# Patient Record
Sex: Male | Born: 1950
Health system: Southern US, Community
[De-identification: ages and names within clinical notes are randomized; demographics above are authoritative.]

## PROBLEM LIST (undated history)

## (undated) DIAGNOSIS — I10 Essential (primary) hypertension: Secondary | ICD-10-CM

## (undated) DIAGNOSIS — C801 Malignant (primary) neoplasm, unspecified: Secondary | ICD-10-CM

## (undated) DIAGNOSIS — M1990 Unspecified osteoarthritis, unspecified site: Secondary | ICD-10-CM

## (undated) DIAGNOSIS — E785 Hyperlipidemia, unspecified: Secondary | ICD-10-CM

## (undated) HISTORY — DX: Hyperlipidemia, unspecified: E78.5

---

## 1998-03-19 ENCOUNTER — Inpatient Hospital Stay: Admission: RE | Admit: 1998-03-19 | Discharge: 1998-03-23 | Payer: Self-pay | Admitting: Thoracic Surgery

## 2000-08-24 HISTORY — PX: PROSTATE SURGERY: SHX751

## 2000-08-24 HISTORY — PX: OTHER SURGICAL HISTORY: SHX169

## 2001-07-08 ENCOUNTER — Other Ambulatory Visit: Admission: RE | Admit: 2001-07-08 | Discharge: 2001-07-08 | Payer: Self-pay | Admitting: Urology

## 2006-03-15 ENCOUNTER — Ambulatory Visit: Payer: Self-pay | Admitting: Gastroenterology

## 2006-10-12 ENCOUNTER — Inpatient Hospital Stay (HOSPITAL_COMMUNITY): Admission: EM | Admit: 2006-10-12 | Discharge: 2006-10-13 | Payer: Self-pay | Admitting: Emergency Medicine

## 2008-12-20 ENCOUNTER — Emergency Department: Payer: Self-pay | Admitting: Emergency Medicine

## 2010-03-31 ENCOUNTER — Emergency Department (HOSPITAL_COMMUNITY): Admission: EM | Admit: 2010-03-31 | Discharge: 2010-03-31 | Payer: Self-pay | Admitting: Emergency Medicine

## 2010-11-07 LAB — COMPREHENSIVE METABOLIC PANEL
ALT: 18 U/L (ref 0–53)
AST: 33 U/L (ref 0–37)
Albumin: 3.7 g/dL (ref 3.5–5.2)
BUN: 7 mg/dL (ref 6–23)
Chloride: 103 mEq/L (ref 96–112)
GFR calc Af Amer: >60 mL/min (ref >60)
Potassium: 5 mEq/L (ref 3.5–5.1)
Total Protein: 7.4 g/dL (ref 6.0–8.3)

## 2010-11-07 LAB — URINALYSIS, ROUTINE W REFLEX MICROSCOPIC
Glucose, UA: NEGATIVE mg/dL
Hgb urine dipstick: NEGATIVE
Nitrite: NEGATIVE
Protein, ur: NEGATIVE mg/dL
Specific Gravity, Urine: 1.018 (ref 1.005–1.030)
Urobilinogen, UA: 0.2 mg/dL (ref 0.0–1.0)

## 2010-11-07 LAB — DIFFERENTIAL
Basophils Absolute: 0 10*3/uL (ref 0.0–0.1)
Basophils Relative: 0 % (ref 0–1)
Eosinophils Absolute: 0.1 10*3/uL (ref 0.0–0.7)
Lymphocytes Relative: 17 % (ref 12–46)
Monocytes Absolute: 0.5 10*3/uL (ref 0.1–1.0)
Neutro Abs: 6.5 10*3/uL (ref 1.7–7.7)

## 2010-11-07 LAB — URINE CULTURE
Colony Count: NO GROWTH
Culture  Setup Time: 201108080005
Culture: NO GROWTH

## 2010-11-07 LAB — URINE MICROSCOPIC-ADD ON

## 2010-11-07 LAB — CBC
HCT: 45.9 % (ref 39.0–52.0)
Hemoglobin: 15.6 g/dL (ref 13.0–17.0)
MCH: 32.1 pg (ref 26.0–34.0)
Platelets: 246 10*3/uL (ref 150–400)
RBC: 4.86 MIL/uL (ref 4.22–5.81)

## 2011-01-09 NOTE — Discharge Summary (Signed)
NAME:  Carlos Conley, Carlos Conley NO.:  [id number]   MEDICAL RECORD NO.:  [id number]          PATIENT TYPE:  INP   LOCATION:  2852                         FACILITY:  [facility]   PHYSICIAN:  Ricki Rodriguez, M.D.  DATE OF BIRTH:  May 28, 1951   DATE OF ADMISSION:  10/12/2006  DATE OF DISCHARGE:  10/13/2006                               DISCHARGE SUMMARY   REFERRING PHYSICIAN:  Dr. Renard Matter [place]   HOSPITAL LOCATION:  [phone], bed one.   FINAL DIAGNOSES:  1. Chest pain.  2. Syncope and collapse.   PRINCIPAL PROCEDURE:  Left heart catheterization, selective coronary  angiography, left renal function study.   DISCHARGE MEDICATIONS:  1. Aspirin 325 mg, one daily.  2. Metoprolol succinate 625 mg, one daily.  3. Zocor 20 mg one daily.  4. Colace 100 mg two daily.   DISCHARGE DIET:  Low-fat, low-salt diet.   DISCHARGE ACTIVITY:  1. The patient increase activity slowly.  2. Followup by primary care physician in 2 weeks.   CONDITION ON DISCHARGE:  Improved.   SPECIAL INSTRUCTIONS:  The patient to discontinue alcohol intake as soon  as possible.   HISTORY:  This 60 year old black male presented with chest pain,  sweating spell and was passing spell for 8 minutes, without  incontinence.  The patient had flu-like symptoms and diarrhea a week  ago, has a past history of alcohol intake.   PHYSICAL EXAMINATION:  VITAL SIGNS:  Pulse 82, respirations 16, blood  pressure 151/96, temperature 97.9, height 5 feet 8 inch, weight 200  pounds, oxygen saturation 99% on room air.  The patient  GENERAL:  The patient is averagely built and well-nourished.  HEENT:  The patient is normocephalic, atraumatic with brown eyes.  Pupils equally reacting to light.  Extraocular motion intact.  NECK:  Supple.  No JVD.  LUNGS:  Clear to auscultation bilaterally.  HEART:  Normal S1-S2.  ABDOMEN:  Soft and nontender.  EXTREMITIES:  No edema, cyanosis, clubbing.  NEUROLOGICALLY:  The patient  was alert, oriented x3 and cranial nerves  grossly intact.   LABORATORY DATA:  Revealed normal hemoglobin/hematocrit, WBC count,  platelet count normal, PT/INR, PTT normal.  CK-MB, troponin I.  Sugar is  slightly elevated at 230, potassium 4.4, sodium 138.  Creatinine is  slightly elevated at 171, albumin slightly low at 2.9.  Cardiac enzymes  borderline at 4.6.  CK-MB and B natriuretic peptide borderline at 153.   HOSPITAL COURSE:  The patient was admitted to telemetry unit.  His chest  pain along with syncopal episode and abnormal cardiac enzymes.  The  patient was encouraged to undergo cardiac catheterization.  This showed  normal coronaries and mild apical hypokinesia with ejection fraction of  50%.  A small dose of beta blocker was added, and the patient was  advised to check with his primary physician for his elevated sugar  levels and follow up with me in 2 weeks.      Ricki Rodriguez, M.D.  Electronically Signed     ASK/MEDQ  D:  01/06/2007  T:  01/06/2007  Job:  [id number]

## 2011-01-09 NOTE — Cardiovascular Report (Signed)
NAME:  Carlos Conley, OZMENT NO.:  [id number]   MEDICAL RECORD NO.:  [id number]           PATIENT TYPE:   LOCATION:                                 FACILITY:   PHYSICIAN:  Ricki Rodriguez, M.D.       DATE OF BIRTH:   DATE OF PROCEDURE:  DATE OF DISCHARGE:                            CARDIAC CATHETERIZATION   PROCEDURES:  Left heart catheterization, selective coronary angiography,  left ventricular function study.   INDICATIONS:  This 60 year old black male had severe chest pain with a  few seconds of syncope and weakness.   APPROACH:  Right femoral artery using 4-French sheath and catheters.   COMPLICATIONS:  None.   Less than 50 cc of dye was used.   Coronary anatomy:  The left main coronary artery was unremarkable.   Left anterior descending coronary artery:  The left anterior descending  coronary artery was also unremarkable and it wrapped around apex of the  heart.   Diagonal vessel was unremarkable.   Left circumflex coronary artery was also unremarkable.   Obtuse marginal branch 1, 2 and 3 were also normal.  The ramus branch  was very small vessel.   Right coronary artery:  The right coronary artery was dominant in its  posterolateral branch and posterior descending branches were  unremarkable.   Left ventriculogram:  The left ventriculogram showed mild apical  hypokinesia with ejection fraction of 50%.   IMPRESSION:  1. Normal coronaries.  2. Preserved left ventricular systolic function.   RECOMMENDATIONS:  This patient will continue medical therapy with  addition of a small dose of beta blocker and antianxiety medication.     Ricki Rodriguez, M.D.  Electronically Signed    ASK/MEDQ  D:  07/29/2007  T:  07/30/2007  Job:  [id number]

## 2012-02-04 ENCOUNTER — Ambulatory Visit: Payer: Self-pay | Admitting: Urology

## 2012-06-14 DIAGNOSIS — C61 Malignant neoplasm of prostate: Secondary | ICD-10-CM | POA: Insufficient documentation

## 2012-07-13 ENCOUNTER — Ambulatory Visit: Payer: Self-pay | Admitting: Unknown Physician Specialty

## 2012-07-29 ENCOUNTER — Emergency Department (HOSPITAL_COMMUNITY): Payer: PRIVATE HEALTH INSURANCE

## 2012-07-29 ENCOUNTER — Emergency Department (HOSPITAL_COMMUNITY)
Admission: EM | Admit: 2012-07-29 | Discharge: 2012-07-29 | Disposition: A | Discharge status: Home / Self Care | Payer: PRIVATE HEALTH INSURANCE | Attending: Emergency Medicine | Admitting: Emergency Medicine

## 2012-07-29 ENCOUNTER — Encounter (HOSPITAL_COMMUNITY): Payer: Self-pay | Admitting: *Deleted

## 2012-07-29 DIAGNOSIS — J209 Acute bronchitis, unspecified: Secondary | ICD-10-CM | POA: Insufficient documentation

## 2012-07-29 DIAGNOSIS — R05 Cough: Secondary | ICD-10-CM | POA: Insufficient documentation

## 2012-07-29 DIAGNOSIS — Z859 Personal history of malignant neoplasm, unspecified: Secondary | ICD-10-CM | POA: Insufficient documentation

## 2012-07-29 DIAGNOSIS — R059 Cough, unspecified: Secondary | ICD-10-CM | POA: Insufficient documentation

## 2012-07-29 DIAGNOSIS — I10 Essential (primary) hypertension: Secondary | ICD-10-CM | POA: Insufficient documentation

## 2012-07-29 HISTORY — DX: Essential (primary) hypertension: I10

## 2012-07-29 HISTORY — DX: Malignant (primary) neoplasm, unspecified: C80.1

## 2012-07-29 MED ORDER — ALBUTEROL SULFATE HFA 108 (90 BASE) MCG/ACT IN AERS
2.0000 | INHALATION_SPRAY | Freq: Once | RESPIRATORY_TRACT | Status: AC
  Administered 2012-07-29: 2 via RESPIRATORY_TRACT
  Filled 2012-07-29: qty 6.7

## 2012-07-29 MED ORDER — HYDROCODONE-ACETAMINOPHEN 7.5-500 MG/15ML PO SOLN: 10.0000 mL | Freq: Four times a day (QID) | ORAL | Status: DC | PRN

## 2012-07-29 NOTE — ED Provider Notes (Signed)
History     CSN: [id number]  Arrival date & time 07/29/12  0807   First MD Initiated Contact with Patient 07/29/12 1000      Chief Complaint  Patient presents with  . Nasal Congestion  . Cough    (Consider location/radiation/quality/duration/timing/severity/associated sxs/prior treatment) HPI Carlos Conley is a 61 y.o. male with PMH significant for HTN ([facility] [place]) complaining of productive, positional cough, worse at night, waked him from night. Diarrhea x5 days ago cough started 4 days ago, subjective fever last night. Pt was seen by company nurse  and given Z pack and medrol dose pack, he is on day 2 of five. Denies N/V, endorses pleuritic chest pain.   Past Medical History  Diagnosis Date  . Hypertension   . Cancer     History reviewed. No pertinent past surgical history.  History reviewed. No pertinent family history.  History  Substance Use Topics  . Smoking status: Never Smoker   . Smokeless tobacco: Not on file  . Alcohol Use: Yes      Review of Systems  Constitutional: Negative for fever.  Respiratory: Positive for cough. Negative for shortness of breath.   Cardiovascular: Negative for chest pain.  Gastrointestinal: Negative for nausea, vomiting, abdominal pain and diarrhea.  All other systems reviewed and are negative.    Allergies  Lipitor  Home Medications  No current outpatient prescriptions on file.  BP 161/100  Pulse 80  Temp 97.7 F (36.5 C) (Oral)  Resp 16  SpO2 94%  Physical Exam  Nursing note and vitals reviewed. Constitutional: He is oriented to person, place, and time. He appears well-developed and well-nourished. No distress.  HENT:  Head: Normocephalic.  Mouth/Throat: Oropharynx is clear and moist.  Eyes: Conjunctivae normal and EOM are normal. Pupils are equal, round, and reactive to light.  Neck: Normal range of motion.  Cardiovascular: Normal rate.   Pulmonary/Chest: Effort normal and breath sounds normal.  No stridor. No respiratory distress. He has no wheezes. He has no rales. He exhibits no tenderness.  Abdominal: Soft. Bowel sounds are normal. He exhibits no distension and no mass. There is no tenderness. There is no rebound and no guarding.  Musculoskeletal: Normal range of motion.  Neurological: He is alert and oriented to person, place, and time.  Psychiatric: He has a normal mood and affect.    ED Course  Procedures (including critical care time)  Labs Reviewed - No data to display Dg Chest 2 View  07/29/2012  *RADIOLOGY REPORT*  Clinical Data: Cough and wheezing.  Hypertension.  Fever.  CHEST - 2 VIEW  Comparison: 10/12/2006  Findings: Tortuosity of the thoracic aorta is present.  Airway thickening noted bilaterally.  Mild thoracic spondylosis noted.  No pleural effusion or airspace opacity identified.  IMPRESSION:  1. Airway thickening favoring bronchitis.  Reactive airways disease can cause a similar appearance.   Original Report Authenticated By: Gaylyn Rong, M.D.     Date: 07/29/2012  Rate: 76  Rhythm: normal sinus rhythm  QRS Axis: normal  Intervals: normal  ST/T Wave abnormalities: normal  Conduction Disutrbances:none  Narrative Interpretation:   Old EKG Reviewed: unchanged   1. Acute bronchitis       MDM  Please note that patient's describes a fever with warm sweat, and not a true diaphoresis as noted in the triage note.   Lung sounds are clear to auscultation. Patient's vital signs are stable. Chest x-ray is clear. He has only recently started his Medrol Dosepak and  Z-Pak. I will encourage him to continue with these and also write him for cough medication and give him an inhaler in the ED.   Pt verbalized understanding and agrees with care plan. Outpatient follow-up and return precautions given.    New Prescriptions   HYDROCODONE-ACETAMINOPHEN (LORTAB) 7.5-500 MG/15ML SOLUTION    Take 10 mLs by mouth every 6 (six) hours as needed for cough.          Wynetta Emery, PA-C 07/29/12 [phone]

## 2012-07-29 NOTE — ED Notes (Signed)
To ED for further eval of chest congestion, cough, and diaphoresis. Pt was seen Wednesday and given zithromax.

## 2012-07-30 NOTE — ED Provider Notes (Signed)
Medical screening examination/treatment/procedure(s) were performed by non-physician practitioner and as supervising physician I was immediately available for consultation/collaboration.   Charles B. Bernette Mayers, MD 07/30/12 [phone]

## 2012-12-06 ENCOUNTER — Ambulatory Visit (INDEPENDENT_AMBULATORY_CARE_PROVIDER_SITE_OTHER): Payer: BC Managed Care – PPO | Admitting: General Surgery

## 2012-12-06 ENCOUNTER — Encounter: Payer: Self-pay | Admitting: General Surgery

## 2012-12-06 VITALS — BP 132/80 | HR 74 | Resp 12 | Ht 68.0 in | Wt 205.0 lb

## 2012-12-06 DIAGNOSIS — IMO0002 Reserved for concepts with insufficient information to code with codable children: Secondary | ICD-10-CM

## 2012-12-06 DIAGNOSIS — L02412 Cutaneous abscess of left axilla: Secondary | ICD-10-CM

## 2012-12-06 NOTE — Progress Notes (Signed)
Patient ID: Carlos Conley, male   DOB: 02-24-1951, 62 y.o.   MRN: [id number]  Chief Complaint  Patient presents with  . Abscess    left arm    HPI Carlos Conley is a 62 y.o. male who presents for a large abscess of the left arm drained yesterday and started on antibiotic, Septra.  Has had this before about 1 year ago. Wants to discuss having the "cyst " removed. HPI  Past Medical History  Diagnosis Date  . Hypertension   . Cancer     Past Surgical History  Procedure Laterality Date  . Prostate surgery  2002    History reviewed. No pertinent family history.  Social History History  Substance Use Topics  . Smoking status: Never Smoker   . Smokeless tobacco: Never Used  . Alcohol Use: 3.6 oz/week    6 Cans of beer per week    Allergies  Allergen Reactions  . Lipitor (Atorvastatin) Rash    Current Outpatient Prescriptions  Medication Sig Dispense Refill  . Cholecalciferol (VITAMIN D3) 2000 UNITS TABS Take 2,000 Units by mouth daily.      Marland Kitchen losartan-hydrochlorothiazide (HYZAAR) 100-12.5 MG per tablet Take 1 tablet by mouth daily.      Marland Kitchen sulfamethoxazole-trimethoprim (BACTRIM DS) 800-160 MG per tablet Take 1 tablet by mouth 2 (two) times daily.       No current facility-administered medications for this visit.    Review of Systems Review of Systems  Constitutional: Negative.   Respiratory: Negative.   Cardiovascular: Negative.     Blood pressure 132/80, pulse 74, resp. rate 12, height 5\' 8"  (1.727 m), weight 205 lb (92.987 kg).  Physical Exam Physical Exam  Constitutional: He is oriented to person, place, and time. He appears well-developed and well-nourished.  Cardiovascular: Normal rate and regular rhythm.   Pulmonary/Chest: Effort normal and breath sounds normal.  Neurological: He is alert and oriented to person, place, and time.  Skin: Skin is warm and dry.   2.5 cm abscessed area left axillary area. This was gently probed with both a Q-tip and  hemostat to release about 5 cc of purulent fluid. The patient tolerated this well.  Data Reviewed None to review.  Assessment    Left axillary abscess, recurrent.    Plan    Arrangements were made for reassessment in 3 weeks when the area was significantly reduced in volume. Due to the recurrent nature of the inflammatory process excision. The patient will complete his previously prescribed course of Bactrim. The use of warm compresses was discussed.       Earline Mayotte 12/07/2012, 3:03 PM

## 2012-12-06 NOTE — Patient Instructions (Addendum)
Keep area clean May use dressing as needed for drainage May use heating pad for comfort Finish antibiotic

## 2012-12-07 ENCOUNTER — Encounter: Payer: Self-pay | Admitting: General Surgery

## 2013-01-05 ENCOUNTER — Encounter: Payer: Self-pay | Admitting: General Surgery

## 2013-01-05 ENCOUNTER — Ambulatory Visit (INDEPENDENT_AMBULATORY_CARE_PROVIDER_SITE_OTHER): Payer: BC Managed Care – PPO | Admitting: General Surgery

## 2013-01-05 VITALS — BP 130/88 | HR 80 | Resp 16 | Ht 68.0 in | Wt 203.0 lb

## 2013-01-05 DIAGNOSIS — IMO0002 Reserved for concepts with insufficient information to code with codable children: Secondary | ICD-10-CM

## 2013-01-05 DIAGNOSIS — L02419 Cutaneous abscess of limb, unspecified: Secondary | ICD-10-CM | POA: Insufficient documentation

## 2013-01-05 NOTE — Progress Notes (Signed)
Patient ID: Carlos Conley, male   DOB: Feb 10, 1951, 62 y.o.   MRN: [id number]  Chief Complaint  Patient presents with  . Follow-up    excision of left axillary abscess    HPI Carlos Conley is a 62 y.o. male who presents for a follow up for an excision of a left axillary abscess. He states the area has gotten a lot smaller since he has taken the antibiotics he was prescribed. He states no complaints at this time.  HPI  Past Medical History  Diagnosis Date  . Hypertension   . Cancer     Past Surgical History  Procedure Laterality Date  . Prostate surgery  2002    History reviewed. No pertinent family history.  Social History History  Substance Use Topics  . Smoking status: Never Smoker   . Smokeless tobacco: Never Used  . Alcohol Use: 3.6 oz/week    6 Cans of beer per week    Allergies  Allergen Reactions  . Lipitor (Atorvastatin) Rash    Current Outpatient Prescriptions  Medication Sig Dispense Refill  . Cholecalciferol (VITAMIN D3) 2000 UNITS TABS Take 2,000 Units by mouth daily.      Marland Kitchen losartan-hydrochlorothiazide (HYZAAR) 100-12.5 MG per tablet Take 1 tablet by mouth daily.      Marland Kitchen sulfamethoxazole-trimethoprim (BACTRIM DS) 800-160 MG per tablet Take 1 tablet by mouth 2 (two) times daily.       No current facility-administered medications for this visit.    Review of Systems Review of Systems  Blood pressure 130/88, pulse 80, resp. rate 16, height 5\' 8"  (1.727 m), weight 203 lb (92.08 kg).  Physical Exam Physical Exam The marked swelling previously identified in the left axilla has significantly improved since his last visit. There remains a 1.5 cm area of thickened but noninflamed tissue suggestive of the residual abscess. Data Reviewed None.  Assessment    Recurrent left axillary abscess.    Plan    The patient was amenable excision of the residual abscess process. 10 cc of 0.5% Xylocaine with 0.25% Marcaine with 1 200,000 units of epinephrine was  utilized for local anesthesia well tolerated. ChloraPrep was applied to the skin. An elliptical incision was used to excise the area. This was sent in formalin for routine histology. Scant bleeding was noted. The skin defect was closed with interrupted 4-0 Prolene horizontal mattress sutures. A dry dressing with Telfa and Tegaderm was provided. The patient was instructed in regards to wound care. He'll return in one week for suture removal with the nurse. Tylenol or Advil be used for pain.       Earline Mayotte 01/06/2013, 7:09 AM

## 2013-01-05 NOTE — Patient Instructions (Addendum)
Patient to return in 1 week Nurse Visit for suture removal.

## 2013-01-06 ENCOUNTER — Encounter: Payer: Self-pay | Admitting: General Surgery

## 2013-01-10 LAB — PATHOLOGY

## 2013-01-11 ENCOUNTER — Ambulatory Visit (INDEPENDENT_AMBULATORY_CARE_PROVIDER_SITE_OTHER): Payer: BC Managed Care – PPO | Admitting: *Deleted

## 2013-01-11 DIAGNOSIS — L02419 Cutaneous abscess of limb, unspecified: Secondary | ICD-10-CM

## 2013-01-11 DIAGNOSIS — IMO0002 Reserved for concepts with insufficient information to code with codable children: Secondary | ICD-10-CM

## 2013-01-11 NOTE — Patient Instructions (Addendum)
The sutures were removed and steri strips applied. Clean and not signs of infection.

## 2013-02-16 DIAGNOSIS — N529 Male erectile dysfunction, unspecified: Secondary | ICD-10-CM | POA: Insufficient documentation

## 2013-08-10 ENCOUNTER — Ambulatory Visit: Payer: Self-pay | Admitting: Radiation Oncology

## 2013-08-24 ENCOUNTER — Ambulatory Visit: Payer: Self-pay | Admitting: Radiation Oncology

## 2013-09-11 LAB — CBC CANCER CENTER
BASOS PCT: 0.9 %
Basophil #: 0.1 x10 3/mm (ref 0.0–0.1)
EOS ABS: 0.1 x10 3/mm (ref 0.0–0.7)
Eosinophil %: 2 %
HCT: 42.3 % (ref 40.0–52.0)
HGB: 13.9 g/dL (ref 13.0–18.0)
Lymphocyte #: 1.1 x10 3/mm (ref 1.0–3.6)
Lymphocyte %: 20.9 %
MCH: 31 pg (ref 26.0–34.0)
MCHC: 32.8 g/dL (ref 32.0–36.0)
MCV: 95 fL (ref 80–100)
MONOS PCT: 7.5 %
Monocyte #: 0.4 x10 3/mm (ref 0.2–1.0)
NEUTROS ABS: 3.7 x10 3/mm (ref 1.4–6.5)
NEUTROS PCT: 68.7 %
PLATELETS: 238 x10 3/mm (ref 150–440)
RBC: 4.48 10*6/uL (ref 4.40–5.90)
RDW: 14.4 % (ref 11.5–14.5)
WBC: 5.4 x10 3/mm (ref 3.8–10.6)

## 2013-09-18 LAB — CBC CANCER CENTER
BASOS PCT: 0.7 %
Basophil #: 0 x10 3/mm (ref 0.0–0.1)
EOS PCT: 2 %
Eosinophil #: 0.1 x10 3/mm (ref 0.0–0.7)
HCT: 42.3 % (ref 40.0–52.0)
HGB: 13.9 g/dL (ref 13.0–18.0)
LYMPHS ABS: 0.8 x10 3/mm — AB (ref 1.0–3.6)
Lymphocyte %: 15.4 %
MCH: 31.2 pg (ref 26.0–34.0)
MCHC: 32.8 g/dL (ref 32.0–36.0)
MCV: 95 fL (ref 80–100)
Monocyte #: 0.4 x10 3/mm (ref 0.2–1.0)
Monocyte %: 8.3 %
NEUTROS PCT: 73.6 %
Neutrophil #: 3.7 x10 3/mm (ref 1.4–6.5)
Platelet: 186 x10 3/mm (ref 150–440)
RBC: 4.45 10*6/uL (ref 4.40–5.90)
RDW: 14.5 % (ref 11.5–14.5)
WBC: 5.1 x10 3/mm (ref 3.8–10.6)

## 2013-09-24 ENCOUNTER — Ambulatory Visit: Payer: Self-pay | Admitting: Radiation Oncology

## 2013-09-25 LAB — CBC CANCER CENTER
Basophil #: 0 x10 3/mm (ref 0.0–0.1)
Basophil %: 0.7 %
EOS PCT: 1.9 %
Eosinophil #: 0.1 x10 3/mm (ref 0.0–0.7)
HCT: 42.2 % (ref 40.0–52.0)
HGB: 13.8 g/dL (ref 13.0–18.0)
LYMPHS PCT: 12.1 %
Lymphocyte #: 0.6 x10 3/mm — ABNORMAL LOW (ref 1.0–3.6)
MCH: 31 pg (ref 26.0–34.0)
MCHC: 32.7 g/dL (ref 32.0–36.0)
MCV: 95 fL (ref 80–100)
MONO ABS: 0.4 x10 3/mm (ref 0.2–1.0)
Monocyte %: 7.9 %
NEUTROS ABS: 3.7 x10 3/mm (ref 1.4–6.5)
Neutrophil %: 77.4 %
PLATELETS: 169 x10 3/mm (ref 150–440)
RBC: 4.46 10*6/uL (ref 4.40–5.90)
RDW: 14.5 % (ref 11.5–14.5)
WBC: 4.8 x10 3/mm (ref 3.8–10.6)

## 2013-10-02 LAB — CBC CANCER CENTER
BASOS ABS: 0 x10 3/mm (ref 0.0–0.1)
Basophil %: 0.5 %
EOS ABS: 0.1 x10 3/mm (ref 0.0–0.7)
EOS PCT: 1.8 %
HCT: 41.8 % (ref 40.0–52.0)
HGB: 13.7 g/dL (ref 13.0–18.0)
LYMPHS PCT: 11.2 %
Lymphocyte #: 0.5 x10 3/mm — ABNORMAL LOW (ref 1.0–3.6)
MCH: 31.1 pg (ref 26.0–34.0)
MCHC: 32.8 g/dL (ref 32.0–36.0)
MCV: 95 fL (ref 80–100)
MONO ABS: 0.4 x10 3/mm (ref 0.2–1.0)
MONOS PCT: 8.5 %
NEUTROS ABS: 3.8 x10 3/mm (ref 1.4–6.5)
NEUTROS PCT: 78 %
PLATELETS: 187 x10 3/mm (ref 150–440)
RBC: 4.41 10*6/uL (ref 4.40–5.90)
RDW: 15.2 % — ABNORMAL HIGH (ref 11.5–14.5)
WBC: 4.9 x10 3/mm (ref 3.8–10.6)

## 2013-10-09 LAB — CBC CANCER CENTER
BASOS PCT: 0.8 %
Basophil #: 0 x10 3/mm (ref 0.0–0.1)
Eosinophil #: 0.1 x10 3/mm (ref 0.0–0.7)
Eosinophil %: 2 %
HCT: 41.2 % (ref 40.0–52.0)
HGB: 13.5 g/dL (ref 13.0–18.0)
LYMPHS ABS: 0.6 x10 3/mm — AB (ref 1.0–3.6)
LYMPHS PCT: 9.9 %
MCH: 31.2 pg (ref 26.0–34.0)
MCHC: 32.8 g/dL (ref 32.0–36.0)
MCV: 95 fL (ref 80–100)
MONO ABS: 0.6 x10 3/mm (ref 0.2–1.0)
Monocyte %: 10.1 %
NEUTROS ABS: 4.5 x10 3/mm (ref 1.4–6.5)
NEUTROS PCT: 77.2 %
PLATELETS: 213 x10 3/mm (ref 150–440)
RBC: 4.35 10*6/uL — ABNORMAL LOW (ref 4.40–5.90)
RDW: 15.5 % — AB (ref 11.5–14.5)
WBC: 5.9 x10 3/mm (ref 3.8–10.6)

## 2013-10-16 LAB — CBC CANCER CENTER
BASOS PCT: 0.7 %
Basophil #: 0 x10 3/mm (ref 0.0–0.1)
Eosinophil #: 0.1 x10 3/mm (ref 0.0–0.7)
Eosinophil %: 1.4 %
HCT: 41.4 % (ref 40.0–52.0)
HGB: 13.6 g/dL (ref 13.0–18.0)
Lymphocyte #: 0.5 x10 3/mm — ABNORMAL LOW (ref 1.0–3.6)
Lymphocyte %: 9 %
MCH: 31.2 pg (ref 26.0–34.0)
MCHC: 33 g/dL (ref 32.0–36.0)
MCV: 95 fL (ref 80–100)
MONO ABS: 0.5 x10 3/mm (ref 0.2–1.0)
MONOS PCT: 9.7 %
NEUTROS ABS: 4.3 x10 3/mm (ref 1.4–6.5)
Neutrophil %: 79.2 %
Platelet: 211 x10 3/mm (ref 150–440)
RBC: 4.38 10*6/uL — AB (ref 4.40–5.90)
RDW: 15.1 % — ABNORMAL HIGH (ref 11.5–14.5)
WBC: 5.4 x10 3/mm (ref 3.8–10.6)

## 2013-10-22 ENCOUNTER — Ambulatory Visit: Payer: Self-pay | Admitting: Radiation Oncology

## 2013-11-22 ENCOUNTER — Ambulatory Visit: Payer: Self-pay | Admitting: Radiation Oncology

## 2014-04-06 ENCOUNTER — Ambulatory Visit: Payer: Self-pay | Admitting: Radiation Oncology

## 2014-04-24 ENCOUNTER — Ambulatory Visit: Payer: Self-pay | Admitting: Radiation Oncology

## 2014-10-11 ENCOUNTER — Ambulatory Visit: Payer: Self-pay | Admitting: Radiation Oncology

## 2014-10-23 ENCOUNTER — Ambulatory Visit
Admit: 2014-10-23 | Disposition: A | Discharge status: Home / Self Care | Payer: Self-pay | Attending: Radiation Oncology | Admitting: Radiation Oncology

## 2014-12-14 NOTE — Consult Note (Signed)
Reason for Visit: This 64 year old Male patient presents to the clinic for initial evaluation of  prostate cancer .   Referred by Dr. Bernardo Heater.  Diagnosis:  Chief Complaint/Diagnosis   77-year-old male status post radical prostatectomy in 2003 now with biochemical recurrence up to PSA 1.6 now for salvage radiation therapy using IMRT.  Pathology Report pathology report reviewedr   Imaging Report PET/CT scan and ProstaScint scan was from 2013 reviewed   Referral Report clinical notes reviewed   Planned Treatment Regimen salvage radiation therapy using IMRT   HPI   patient is a pleasant 64 year old male presented rising PSA back in 2003 had core biopsies positive for adenocarcinoma. Underwent radical prostatectomyin 2003 for a Gleason 7 (4+3) involving 40% of the prostate. No extracapsular invasion or seminal vesicle invasion was noted. Margins were close in multiple regions one lymph node was negative. Patient was staged a pathologic T2 B. N0. PSA postoperatively was less than 0.1. He did have some minor stress incontinence. He has maintained pain and erectile function. Back in 2013 he was noted to have a slight rise in his PSA. I see a ProstaScint scan was performedshowing uptake in a supraclavicular as well as left periaortic lymph node although PET scan was negative. CT scan was performed December 2014 at [facility] and was negative for metastatic disease. as well as PET/CT scan. PET/CT scan on my review shows some hypermetabolic uptake in the region of the anus near the region of prostate resection. He has been under observation although recently case was reviewed at [facility] recommendation was made for salvage radiation therapy. Patient is also been determined to have a bone scan would try to obtain information for her records. He is seen today and is doing well. Quite nervous about his prognosis. Having very little urinary symptoms nocturia x1-2 some mild stress incontinence.  Past Hx:     Hypertension:    Prostate Cancer:    Prostatectomy 2003:   Past, Family and Social History:  Past Medical History positive   Cardiovascular hypertension   Past Surgical History prostatectomy in 2003   Family History noncontributory   Social History positive   Social History Comments no smoking history does have weekly EtOH use history   Additional Past Medical and Surgical History seen by himself today   Allergies:   Lipitor: Hives  Home Meds:  Home Medications: Medication Instructions Status  Vitamin D3 1000 intl units oral capsule 2 cap(s) orally once a day Active  Hyzaar 12.5 mg-100 mg oral tablet 1 tab(s) orally once a day Active   Review of Systems:  General negative   Performance Status (ECOG) 0   Skin negative   Breast negative   Ophthalmologic negative   ENMT negative   Respiratory and Thorax negative   Cardiovascular negative   Gastrointestinal negative   Genitourinary see HPI   Musculoskeletal negative   Neurological negative   Psychiatric negative   Hematology/Lymphatics negative   Endocrine negative   Allergic/Immunologic negative   Review of Systems   review of systems obtained from nurse's notes  Nursing Notes:  Nursing Vital Signs and Chemo Nursing Nursing Notes: *CC Vital Signs Flowsheet:   18-Dec-14 11:02  Temp Temperature 98.3  Pulse Pulse 76  Respirations Respirations 20  SBP SBP 154  DBP DBP 94  Pain Scale (0-10)  0  Current Weight (kg) (kg) 92.5  Height (cm) centimeters 169.4  BSA (m2) 2   Physical Exam:  General/Skin/HEENT:  General normal  Skin normal   Eyes normal   ENMT normal   Head and Neck normal   Additional PE well-developed male in NAD. Lungs are clear to A&P cardiac examination shows regular rate and rhythm abdomen is benign with no organomegaly or masses noted. On rectal examination there is some fullness in the prostatic fossa. No discreet nodularity is noted. No other rectal  abnormalities identified no peripheral edema in the lower extremitiesis identified.   Breasts/Resp/CV/GI/GU:  Respiratory and Thorax normal   Cardiovascular normal   Gastrointestinal normal   Genitourinary normal   MS/Neuro/Psych/Lymph:  Musculoskeletal normal   Neurological normal   Lymphatics normal   Other Results:  Radiology Results: LabUnknown:    20-Nov-13 14:58, PET/CT Scan Lung Cancer Initial Staging  PACS Image   Nuclear Med:    18-Jun-13 14:48, Prostascint Scan 2  Prostascint Scan 2   REASON FOR EXAM:    prostate ca w post radical prostatectomy with rising   PSA  COMMENTS:       PROCEDURE: NM  - NM PROSTA SCAN 5TH DAY 3 OF 3  - Feb 09 2012  2:48PM     RESULT: The patient has a history of prostate cancer and radical   prostatectomywith a rising PSA. The patient received an injection of   indium-111 labeled ProstaScint with a dose of 6.26 mCi of indium-111   administered. There is no previous similar study for comparison.    Imaging is performed at 96 and 120 hours with whole-body planar images   and with SPECT CT on 02/08/2012 at the 96 hour exam. The low dose   noncontrast CT, nuclear medicine planar and SPECT images and fused SPECT   CT data are investigated. SPECT CT images are analyzed with [employer]     software which also creates a rotating three-dimensional MIP image.    There is focal uptake of activity in the left para-aortic retroperitoneum   located in several small lymph nodes. No abnormal accumulation is noted   in the prostate bed or pelvis. There is a focal area of increased   accumulation in the left supraclavicular region medially which may   represent involvement within a lymph node.    IMPRESSION:  Abnormal ProstaScint examination. Foci of abnormal   accumulation in the left supraclavicular regionmedially and in the left   para-aortic retroperitoneal region.    Thank you for the opportunity to contribute to the care of your patient.      Dictation Site: 6    Verified By: Sundra Aland, M.D., MD    [phone] 14:58, PET/CT Scan Lung Cancer Initial Staging  PET/CT Scan Lung Cancer Initial Staging   REASON FOR EXAM:    left supracalvical node positive in June  hx prostate   CA  COMMENTS:       PROCEDURE: PET - PET/CT INIT STAGING LUNG CA  - Jul 13 2012  2:58PM     RESULT: History: Prostate cancer. Supraclavicular node on prior   ProstaScint study of 02/09/2012.    Comparison Study: Prior ProstaScint scan of 02/09/2012.    Findings: Following determination of fasting blood sugar of 8 mg/dL 13.4   mCi of F-18 FDG administered to the patient. CT was obtained for   attenuation correction and fusion. Please identified less than clavicular   lymph node is no longer identified on this PET/CT. Neck, chest, abdomen     structures appear normal. Surgical clips in the prostate bed. No PET   positive abnormalities noted this  region. The area of increased FDG   uptake with SUV of 2.7 mean is noted in the anus. Direct visualization   suggested to exclude an anal cancer. Minimal punctate areas of increased   FDG uptake are noted throughout the thoraco- lumbar spine. These changes   are most likely degenerative however metastatic disease cannot be   completely excluded. No other bony abnormalities identified.    IMPRESSION:   1. Previously identified supraclavicular node no longer identified.  2. Punctate areas of subtle increased activity noted thoracolumbar spine   previous changes most likely degenerative subtle metastatic disease   cannot be excluded. Correlation with PSA is suggested. MRI of the   thoraco- lumbar spine can be obtained as needed.   3. A punctate area of increased FDG uptake noted in the region of the     anus.Correlation with physical exam suggested to exclude a tiny anal   cancer.        Verified By: Osa Craver, M.D., MD   Relevent Results:   Relevant Scans and Labs ProstaScint scan  and PET CT scan for 2013 reviewed   Assessment and Plan: Impression:   recurrent prostate cancer now out 11 years since radical prostatectomy in 64 year old male with  progressively elevated PSA no evidence of metastatic disease. Plan:   at this time we'll check his bone scan results. Have offered salvage radiation therapy to his prostatic fossa as well as his pelvic lymph nodes. Would plan on delivering 7600 cGy to his prostatic fossa along with 5400 cGy to his pelvic lymph nodes using IMRT does painting technique.I have treated with dose escalation of the cyst 7600 cGy without significant side effects and abuses is my standard salvage treatment using IMRT does painting technique. Risks and benefits of treatment including diarrhea dysuria or urinary frequency and urgency possible exacerbation of stress incontinence and erectile dysfunction all were explained in detail to the patient. I've set him up for CT simulation shortly after the Christmas holiday.  I would like to take this opportunity to thank you for allowing me to continue to participate in this patient's care.  CC Referral:  cc: Dr. Serita Grit Morrisey,Dr. Arrie Senate, Dr. Bernardo Heater   Electronic Signatures: Baruch Gouty, Roda Shutters (MD)  (Signed 18-Dec-14 11:59)  Authored: HPI, Diagnosis, Past Hx, PFSH, Allergies, Home Meds, ROS, Nursing Notes, Physical Exam, Other Results, Relevent Results, Encounter Assessment and Plan, CC Referring Physician   Last Updated: 18-Dec-14 11:59 by Armstead Peaks (MD)

## 2015-03-18 ENCOUNTER — Ambulatory Visit (INDEPENDENT_AMBULATORY_CARE_PROVIDER_SITE_OTHER): Payer: BLUE CROSS/BLUE SHIELD | Admitting: Family Medicine

## 2015-03-18 ENCOUNTER — Encounter: Payer: Self-pay | Admitting: Family Medicine

## 2015-03-18 VITALS — BP 142/76 | HR 86 | Temp 98.7°F | Resp 16 | Ht 68.0 in | Wt 204.2 lb

## 2015-03-18 DIAGNOSIS — N529 Male erectile dysfunction, unspecified: Secondary | ICD-10-CM

## 2015-03-18 DIAGNOSIS — Z0000 Encounter for general adult medical examination without abnormal findings: Secondary | ICD-10-CM

## 2015-03-18 DIAGNOSIS — Z8546 Personal history of malignant neoplasm of prostate: Secondary | ICD-10-CM

## 2015-03-18 MED ORDER — SILDENAFIL CITRATE 100 MG PO TABS: 50.0000 mg | ORAL_TABLET | Freq: Every day | ORAL | Status: DC | PRN

## 2015-03-18 NOTE — Patient Instructions (Signed)

## 2015-03-18 NOTE — Progress Notes (Signed)
Name: Carlos Conley   MRN: [id number]    DOB: 13-Nov-1950   Date:03/18/2015       Progress Note  Subjective  Chief Complaint  Chief Complaint  Patient presents with  . Annual Exam    HPI  65 year old presented for annual H&P. Other medical problems are stable.  History of prostate cancer  Patient followed annually by his urologist for prostate cancer. His last PSA was less than  Past Medical History  Diagnosis Date  . Hypertension   . Cancer     History  Substance Use Topics  . Smoking status: Never Smoker   . Smokeless tobacco: Never Used  . Alcohol Use: 3.6 oz/week    6 Cans of beer per week     Current outpatient prescriptions:  .  ibuprofen (ADVIL,MOTRIN) 800 MG tablet, Take 800 mg by mouth 2 (two) times daily as needed., Disp: , Rfl: 1 .  losartan-hydrochlorothiazide (HYZAAR) 100-12.5 MG per tablet, Take 1 tablet by mouth daily., Disp: , Rfl:   Allergies  Allergen Reactions  . Lipitor [Atorvastatin] Rash    Review of Systems  Constitutional: Negative for fever, chills and weight loss.  HENT: Negative for congestion, hearing loss, sore throat and tinnitus.   Eyes: Negative for blurred vision, double vision and redness.  Respiratory: Negative for cough, hemoptysis and shortness of breath.   Cardiovascular: Negative for chest pain, palpitations, orthopnea, claudication and leg swelling.  Gastrointestinal: Negative for heartburn, nausea, vomiting, diarrhea, constipation and blood in stool.  Genitourinary: Negative for dysuria, urgency, frequency and hematuria.  Musculoskeletal: Negative for myalgias, back pain, joint pain, falls and neck pain.  Skin: Negative for itching.  Neurological: Negative for dizziness, tingling, tremors, focal weakness, seizures, loss of consciousness, weakness and headaches.  Endo/Heme/Allergies: Does not bruise/bleed easily.  Psychiatric/Behavioral: Negative for depression and substance abuse. The patient is not nervous/anxious and  does not have insomnia.      Objective  Filed Vitals:   03/18/15 1336  BP: 142/76  Pulse: 86  Temp: 98.7 F (37.1 C)  Resp: 16  Height: 5\' 8"  (1.727 m)  Weight: 204 lb 4 oz (92.647 kg)  SpO2: 97%     Physical Exam  Constitutional: He is oriented to person, place, and time and well-developed, well-nourished, and in no distress.  Obese  HENT:  Head: Normocephalic.  Eyes: EOM are normal. Pupils are equal, round, and reactive to light.  Neck: Normal range of motion. Neck supple. No thyromegaly present.  Cardiovascular: Normal rate, regular rhythm and normal heart sounds.   No murmur heard. Pulmonary/Chest: Effort normal and breath sounds normal. No respiratory distress. He has no wheezes.  Abdominal: Soft. Bowel sounds are normal.  Musculoskeletal: Normal range of motion. He exhibits no edema.  Lymphadenopathy:    He has no cervical adenopathy.  Neurological: He is alert and oriented to person, place, and time. No cranial nerve deficit. Gait normal. Coordination normal.  Skin: Skin is warm and dry. No rash noted.  Psychiatric: Affect and judgment normal.      Assessment & Plan   1. Annual physical exam  - POC Hemoccult Bld/Stl (3-Cd Home Screen); Future  2. Erectile dysfunction, unspecified erectile dysfunction type - sildenafil (VIAGRA) 100 MG tablet; Take 0.5-1 tablets (50-100 mg total) by mouth daily as needed for erectile dysfunction.  Dispense: 6 tablet; Refill: 11

## 2015-06-19 ENCOUNTER — Ambulatory Visit (INDEPENDENT_AMBULATORY_CARE_PROVIDER_SITE_OTHER): Payer: BLUE CROSS/BLUE SHIELD | Admitting: Family Medicine

## 2015-06-19 ENCOUNTER — Encounter: Payer: Self-pay | Admitting: Family Medicine

## 2015-06-19 VITALS — BP 158/82 | HR 81 | Temp 98.8°F | Resp 18 | Ht 68.0 in | Wt 209.3 lb

## 2015-06-19 DIAGNOSIS — E785 Hyperlipidemia, unspecified: Secondary | ICD-10-CM

## 2015-06-19 DIAGNOSIS — I10 Essential (primary) hypertension: Secondary | ICD-10-CM

## 2015-06-19 DIAGNOSIS — E669 Obesity, unspecified: Secondary | ICD-10-CM | POA: Diagnosis not present

## 2015-06-19 DIAGNOSIS — Z23 Encounter for immunization: Secondary | ICD-10-CM | POA: Diagnosis not present

## 2015-06-19 DIAGNOSIS — J309 Allergic rhinitis, unspecified: Secondary | ICD-10-CM

## 2015-06-19 MED ORDER — FEXOFENADINE HCL 180 MG PO TABS: 180.0000 mg | ORAL_TABLET | Freq: Every day | ORAL | Status: DC

## 2015-06-19 NOTE — Progress Notes (Signed)
Name: Carlos Conley   MRN: [id number]    DOB: [birth year]   Date:06/19/2015       Progress Note  Subjective  Chief Complaint  Chief Complaint  Patient presents with  . Hypertension    3 month follow up    Hypertension Pertinent negatives include no blurred vision, chest pain, headaches, neck pain, orthopnea, palpitations or shortness of breath.    Hypertension   Patient presents for follow-up of hypertension. It has been present for over 5 years.  Patient states that there is compliance with medical regimen which consists of losartan HCT 100-12 0.5 once daily. . There is no end organ disease. Cardiac risk factors include hypertension hyperlipidemia and diabetes.  Exercise regimen consist of minimal walking..  Diet consist of some salt restriction .  Obesity  Patient has a history of obesity for multiple  years.  Attempts at weight loss have included diet and exercise and plan diets .  Results of this regimen  have been a 5 pound weight gain .  Patient now voices and interest in weight loss by  her compliance with diet and exercise and walk .  Past Medical History  Diagnosis Date  . Hypertension   . Cancer [facility])     Social History  Substance Use Topics  . Smoking status: Never Smoker   . Smokeless tobacco: Never Used  . Alcohol Use: 3.6 oz/week    6 Cans of beer per week     Current outpatient prescriptions:  .  ibuprofen (ADVIL,MOTRIN) 800 MG tablet, Take 800 mg by mouth 2 (two) times daily as needed., Disp: , Rfl: 1 .  losartan-hydrochlorothiazide (HYZAAR) 100-12.5 MG per tablet, Take 1 tablet by mouth daily., Disp: , Rfl:  .  sildenafil (VIAGRA) 100 MG tablet, Take 0.5-1 tablets (50-100 mg total) by mouth daily as needed for erectile dysfunction., Disp: 6 tablet, Rfl: 11  Allergies  Allergen Reactions  . Lipitor [Atorvastatin] Rash    Review of Systems  Constitutional: Negative for fever, chills and weight loss.  HENT: Negative for congestion, hearing loss,  sore throat and tinnitus.   Eyes: Negative for blurred vision, double vision and redness.  Respiratory: Negative for cough, hemoptysis and shortness of breath.   Cardiovascular: Negative for chest pain, palpitations, orthopnea, claudication and leg swelling.  Gastrointestinal: Negative for heartburn, nausea, vomiting, diarrhea, constipation and blood in stool.  Genitourinary: Negative for dysuria, urgency, frequency and hematuria.  Musculoskeletal: Negative for myalgias, back pain, joint pain, falls and neck pain.  Skin: Negative for itching.  Neurological: Negative for dizziness, tingling, tremors, focal weakness, seizures, loss of consciousness, weakness and headaches.  Endo/Heme/Allergies: Does not bruise/bleed easily.  Psychiatric/Behavioral: Negative for depression and substance abuse. The patient is not nervous/anxious and does not have insomnia.      Objective  Filed Vitals:   06/19/15 0756  BP: 158/82  Pulse: 81  Temp: 98.8 F (37.1 C)  TempSrc: Oral  Resp: 18  Height: 5\' 8"  (1.727 m)  Weight: 209 lb 4.8 oz (94.938 kg)  SpO2: 97%     Physical Exam  Constitutional: He is oriented to person, place, and time and well-developed, well-nourished, and in no distress.  HENT:  Head: Normocephalic.  Eyes: EOM are normal. Pupils are equal, round, and reactive to light.  Neck: Normal range of motion. Neck supple. No thyromegaly present.  Cardiovascular: Normal rate, regular rhythm and normal heart sounds.   No murmur heard. Pulmonary/Chest: Effort normal and breath sounds normal. No respiratory distress.  He has no wheezes.  Abdominal: Soft. Bowel sounds are normal.  Musculoskeletal: Normal range of motion. He exhibits no edema.  Lymphadenopathy:    He has no cervical adenopathy.  Neurological: He is alert and oriented to person, place, and time. No cranial nerve deficit. Gait normal. Coordination normal.  Skin: Skin is warm and dry. No rash noted.  Psychiatric: Affect and  judgment normal.      Assessment & Plan  1. Essential hypertension Well-controlled - Comprehensive Metabolic Panel (CMET) - TSH  2. Hyperlipidemia  - Comprehensive Metabolic Panel (CMET) - Lipid panel  3. Obesity Diet and exercise encouraged - Comprehensive Metabolic Panel (CMET) - Lipid panel - TSH  4. Allergic rhinitis, unspecified allergic rhinitis type Over-the-counter antihistamine and nasal steroid as needed  5. Need for influenza vaccination Given - Flu Vaccine QUAD 36+ mos PF IM (Fluarix & Fluzone Quad PF)

## 2015-06-20 ENCOUNTER — Ambulatory Visit: Payer: BLUE CROSS/BLUE SHIELD | Admitting: Family Medicine

## 2015-06-20 LAB — COMPREHENSIVE METABOLIC PANEL
ALBUMIN: 4.6 g/dL (ref 3.6–4.8)
ALT: 35 IU/L (ref 0–44)
AST: 45 IU/L — ABNORMAL HIGH (ref 0–40)
Albumin/Globulin Ratio: 1.9 (ref 1.1–2.5)
Alkaline Phosphatase: 73 IU/L (ref 39–117)
BUN / CREAT RATIO: 5 — AB (ref 10–22)
BUN: 6 mg/dL — AB (ref 8–27)
Bilirubin Total: 0.5 mg/dL (ref 0.0–1.2)
CHLORIDE: 97 mmol/L (ref 97–106)
CO2: 28 mmol/L (ref 18–29)
CREATININE: 1.12 mg/dL (ref 0.76–1.27)
Calcium: 9.9 mg/dL (ref 8.6–10.2)
GFR calc non Af Amer: 70 mL/min/{1.73_m2} (ref >59)
GFR, EST AFRICAN AMERICAN: 81 mL/min/{1.73_m2} (ref >59)
GLUCOSE: 105 mg/dL — AB (ref 65–99)
Globulin, Total: 2.4 g/dL (ref 1.5–4.5)
Potassium: 5 mmol/L (ref 3.5–5.2)
Sodium: 140 mmol/L (ref 136–144)
TOTAL PROTEIN: 7 g/dL (ref 6.0–8.5)

## 2015-06-20 LAB — LIPID PANEL
CHOLESTEROL TOTAL: 248 mg/dL — AB (ref 100–199)
Chol/HDL Ratio: 4.7 ratio units (ref 0.0–5.0)
HDL: 53 mg/dL (ref >39)
LDL CALC: 171 mg/dL — AB (ref 0–99)
Triglycerides: 118 mg/dL (ref 0–149)
VLDL CHOLESTEROL CAL: 24 mg/dL (ref 5–40)

## 2015-06-20 LAB — TSH: TSH: 1.23 u[IU]/mL (ref 0.450–4.500)

## 2015-07-17 ENCOUNTER — Telehealth: Payer: Self-pay | Admitting: Family Medicine

## 2015-07-17 MED ORDER — ROSUVASTATIN CALCIUM 5 MG PO TABS: 5.0000 mg | ORAL_TABLET | Freq: Every day | ORAL | Status: DC

## 2015-07-17 NOTE — Telephone Encounter (Signed)
Pt asking for results of husbands test

## 2015-07-17 NOTE — Telephone Encounter (Signed)
Patient notified of lab results. Crestor was sent to [place]

## 2015-09-19 ENCOUNTER — Ambulatory Visit: Payer: BLUE CROSS/BLUE SHIELD | Admitting: Family Medicine

## 2015-10-21 ENCOUNTER — Other Ambulatory Visit: Payer: Self-pay | Admitting: *Deleted

## 2015-10-21 ENCOUNTER — Ambulatory Visit
Admission: RE | Admit: 2015-10-21 | Discharge: 2015-10-21 | Disposition: A | Discharge status: Home / Self Care | Payer: BLUE CROSS/BLUE SHIELD | Source: Ambulatory Visit | Attending: Radiation Oncology | Admitting: Radiation Oncology

## 2015-10-21 ENCOUNTER — Inpatient Hospital Stay: Payer: BLUE CROSS/BLUE SHIELD | Attending: Radiation Oncology

## 2015-10-21 ENCOUNTER — Encounter: Payer: Self-pay | Admitting: Radiation Oncology

## 2015-10-21 VITALS — BP 161/109 | HR 74 | Temp 98.3°F | Resp 18 | Wt 211.8 lb

## 2015-10-21 DIAGNOSIS — C61 Malignant neoplasm of prostate: Secondary | ICD-10-CM | POA: Insufficient documentation

## 2015-10-21 LAB — PSA: PSA: 0.09 ng/mL (ref 0.00–4.00)

## 2015-10-21 NOTE — Progress Notes (Signed)
Radiation Oncology Follow up Note  Name: Carlos Conley   Date:   10/21/2015 MRN:  [id number] DOB: Dec 28, 1950    This 65 y.o. male presents to the clinic today for follow-up for prostate cancer salvage treatment status post radical prostatectomy 2003 with biochemical failure now seen out 2 years.  REFERRING PROVIDER: Ashok Norris, MD  HPI: Patient is a 65 year old male status post radical prostatectomy back in 2003 for a Gleason 7 (4+3) for(with multiple margins close. He underwent biochemical failure in 2013 and we treated him with salvage radiation therapy. He is seen today in routine follow-up is doing well last PSA was February 2016 was 0.2. He does have occasional urge incontinence. No diarrhea or any other problems with bowel function..  COMPLICATIONS OF TREATMENT: none  FOLLOW UP COMPLIANCE: keeps appointments   PHYSICAL EXAM:  BP 161/109 mmHg  Pulse 74  Temp(Src) 98.3 F (36.8 C)  Resp 18  Wt 211 lb 12 oz (96.05 kg) On rectal exam rectal tone is good prostatic fossa is clear without evidence of nodularity or mass. No other rectal abnormality is identified. Well-developed well-nourished patient in NAD. HEENT reveals PERLA, EOMI, discs not visualized.  Oral cavity is clear. No oral mucosal lesions are identified. Neck is clear without evidence of cervical or supraclavicular adenopathy. Lungs are clear to A&P. Cardiac examination is essentially unremarkable with regular rate and rhythm without murmur rub or thrill. Abdomen is benign with no organomegaly or masses noted. Motor sensory and DTR levels are equal and symmetric in the upper and lower extremities. Cranial nerves II through XII are grossly intact. Proprioception is intact. No peripheral adenopathy or edema is identified. No motor or sensory levels are noted. Crude visual fields are within normal range.  RADIOLOGY RESULTS: No current films for review  PLAN: Present time he is doing well with no evidence of disease.  I've run a PSA level on him today and will report that separately. Otherwise I'm please was overall progress. I have asked to see him back in 1 year for follow-up. Patient knows to call sooner with any concerns.  I would like to take this opportunity for allowing me to participate in the care of your patient.Armstead Peaks., MD

## 2016-01-02 ENCOUNTER — Encounter: Payer: Self-pay | Admitting: Family Medicine

## 2016-01-02 ENCOUNTER — Ambulatory Visit (INDEPENDENT_AMBULATORY_CARE_PROVIDER_SITE_OTHER): Payer: BLUE CROSS/BLUE SHIELD | Admitting: Family Medicine

## 2016-01-02 VITALS — BP 152/90 | HR 82 | Temp 97.9°F | Resp 16 | Ht 68.0 in | Wt 209.0 lb

## 2016-01-02 DIAGNOSIS — Z1159 Encounter for screening for other viral diseases: Secondary | ICD-10-CM | POA: Diagnosis not present

## 2016-01-02 DIAGNOSIS — Z23 Encounter for immunization: Secondary | ICD-10-CM

## 2016-01-02 DIAGNOSIS — I10 Essential (primary) hypertension: Secondary | ICD-10-CM

## 2016-01-02 DIAGNOSIS — Z114 Encounter for screening for human immunodeficiency virus [HIV]: Secondary | ICD-10-CM

## 2016-01-02 DIAGNOSIS — R109 Unspecified abdominal pain: Secondary | ICD-10-CM | POA: Diagnosis not present

## 2016-01-02 DIAGNOSIS — C61 Malignant neoplasm of prostate: Secondary | ICD-10-CM

## 2016-01-02 DIAGNOSIS — E785 Hyperlipidemia, unspecified: Secondary | ICD-10-CM | POA: Insufficient documentation

## 2016-01-02 DIAGNOSIS — R739 Hyperglycemia, unspecified: Secondary | ICD-10-CM | POA: Diagnosis not present

## 2016-01-02 MED ORDER — ASPIRIN EC 81 MG PO TBEC: 81.0000 mg | DELAYED_RELEASE_TABLET | Freq: Every day | ORAL | Status: DC

## 2016-01-02 MED ORDER — PRAVASTATIN SODIUM 40 MG PO TABS: 40.0000 mg | ORAL_TABLET | Freq: Every day | ORAL | Status: DC

## 2016-01-02 MED ORDER — AMLODIPINE BESYLATE 5 MG PO TABS: 2.5000 mg | ORAL_TABLET | Freq: Every day | ORAL | Status: DC

## 2016-01-02 NOTE — Progress Notes (Addendum)
Name: Carlos Conley   MRN: [id number]    DOB: [birth year]   Date:01/02/2016       Progress Note  Subjective  Chief Complaint  Chief Complaint  Patient presents with  . Back Pain    patient stated that he has had intermittent right sided back pain for the past 2 weeks. he had a flare on saturday then he took a pill that day as well as sunday. the pain stopped on monday.     HPI  Right Flank pain: he states he made an appointment on Monday because he had two days of Right flank pain, aggravated by movement. Not associated with dysuria, or fever, no hematuria. He took Ibuprofen for two days and states no symptoms since Monday. He is feeling well now.   HTN: his bp has been elevated for months, explained increased risk of heart attack and strokes. We will add Norvasc, discussed possible side effects. He also needs to start aspirin 81 mg daily . He denies chest pain or SOB  Prostate Cancer: sees Dr. Baruch Gouty, last PSA was  0.09 but he is concerned because it has gone up form 0.02 and would like to have it rechecked.   Hyperlipidemia: he stopped taking Crestor secondary to cost. He retired last year and had insurance change and prescription cost went up. He can't take Lipitor because it caused a rash but willing to try another statin.   Patient Active Problem List   Diagnosis Date Noted  . Hyperlipidemia 01/02/2016  . Essential hypertension 01/02/2016  . Hyperglycemia 01/02/2016  . ED (erectile dysfunction) of organic origin 02/16/2013  . Prostate cancer ([place]) 06/14/2012    Past Surgical History  Procedure Laterality Date  . Prostate surgery  2002  . Prostate surgery  2002    History reviewed. No pertinent family history.  Social History   Social History  . Marital Status: Married    Spouse Name: N/A  . Number of Children: N/A  . Years of Education: N/A   Occupational History  . Not on file.   Social History Main Topics  . Smoking status: Never Smoker   . Smokeless  tobacco: Never Used  . Alcohol Use: 3.6 oz/week    6 Cans of beer per week  . Drug Use: No  . Sexual Activity: Not on file   Other Topics Concern  . Not on file   Social History Narrative     Current outpatient prescriptions:  .  fexofenadine (ALLEGRA ALLERGY) 180 MG tablet, Take 1 tablet (180 mg total) by mouth daily., Disp: 30 tablet, Rfl: 5 .  ibuprofen (ADVIL,MOTRIN) 800 MG tablet, Take 800 mg by mouth 2 (two) times daily as needed., Disp: , Rfl: 1 .  losartan-hydrochlorothiazide (HYZAAR) 100-12.5 MG per tablet, Take 1 tablet by mouth daily., Disp: , Rfl:  .  amLODipine (NORVASC) 5 MG tablet, Take 0.5-1 tablets (2.5-5 mg total) by mouth daily., Disp: 90 tablet, Rfl: 0 .  aspirin EC 81 MG tablet, Take 1 tablet (81 mg total) by mouth daily., Disp: 30 tablet, Rfl: 0 .  pravastatin (PRAVACHOL) 40 MG tablet, Take 1 tablet (40 mg total) by mouth daily., Disp: 90 tablet, Rfl: 0 .  sildenafil (VIAGRA) 100 MG tablet, Take 0.5-1 tablets (50-100 mg total) by mouth daily as needed for erectile dysfunction. (Patient not taking: Reported on 01/02/2016), Disp: 6 tablet, Rfl: 11  Allergies  Allergen Reactions  . Lipitor [Atorvastatin] Rash     ROS  Constitutional: Negative for fever  or weight change.  Respiratory: Negative for cough and shortness of breath.   Cardiovascular: Negative for chest pain or palpitations.  Gastrointestinal: Negative for abdominal pain, no bowel changes.  Musculoskeletal: Negative for gait problem or joint swelling.  Skin: Negative for rash.  Neurological: Negative for dizziness or headache.  No other specific complaints in a complete review of systems (except as listed in HPI above).  Objective  Filed Vitals:   01/02/16 0748  BP: 152/90  Pulse: 82  Temp: 97.9 F (36.6 C)  TempSrc: Oral  Resp: 16  Height: 5\' 8"  (1.727 m)  Weight: 209 lb (94.802 kg)  SpO2: 94%    Body mass index is 31.79 kg/(m^2).  Physical Exam  Constitutional: Patient appears  well-developed and well-nourished. Obese  No distress.  HEENT: head atraumatic, normocephalic, pupils equal and reactive to light,  neck supple, throat within normal limits Cardiovascular: Normal rate, regular rhythm and normal heart sounds.  No murmur heard. No BLE edema. Pulmonary/Chest: Effort normal and breath sounds normal. No respiratory distress. Abdominal: Soft.  There is no tenderness. Psychiatric: Patient has a normal mood and affect. behavior is normal. Judgment and thought content normal.  Recent Results (from the past 2160 hour(s))  PSA     Status: None   Collection Time: 10/21/15  9:00 AM  Result Value Ref Range   PSA 0.09 0.00 - 4.00 ng/mL    Comment: (NOTE) While PSA levels of <=4.0 ng/ml are reported as reference range, some men with levels below 4.0 ng/ml can have prostate cancer and many men with PSA above 4.0 ng/ml do not have prostate cancer.  Other tests such as free PSA, age specific reference ranges, PSA velocity and PSA doubling time may be helpful especially in men less than 42 years old. Performed at [facility]      PHQ2/9: Depression screen [facility] 2/9 01/02/2016 06/19/2015 03/18/2015  Decreased Interest 0 0 0  Down, Depressed, Hopeless 0 0 0  PHQ - 2 Score 0 0 0    Fall Risk: Fall Risk  01/02/2016 06/19/2015 03/18/2015  Falls in the past year? No No No    Functional Status Survey: Is the patient deaf or have difficulty hearing?: No Does the patient have difficulty seeing, even when wearing glasses/contacts?: No Does the patient have difficulty concentrating, remembering, or making decisions?: No Does the patient have difficulty walking or climbing stairs?: No Does the patient have difficulty dressing or bathing?: No Does the patient have difficulty doing errands alone such as visiting a doctor's office or shopping?: No   Assessment & Plan  1. Essential hypertension  - amLODipine (NORVASC) 5 MG tablet; Take 0.5-1 tablets (2.5-5 mg total)  by mouth daily.  Dispense: 90 tablet; Refill: 0 - Comprehensive metabolic panel - CBC with Differential/Platelet Since retirement he has been drinking more beer daily. Explained the need to cut down to max of 2 per day   2. Hyperlipidemia  - pravastatin (PRAVACHOL) 40 MG tablet; Take 1 tablet (40 mg total) by mouth daily.  Dispense: 90 tablet; Refill: 0 - Lipid panel  3. Right flank pain  Resolved, normal exam. Reassurance given   4. Prostate cancer ([place])  - PSA - CBC with Differential/Platelet  5. Hyperglycemia  - Hemoglobin A1c  6. Screening for HIV (human immunodeficiency virus)  - HIV antibody  7. Need for hepatitis C screening test  - Hepatitis C antibody

## 2016-01-06 ENCOUNTER — Telehealth: Payer: Self-pay

## 2016-01-06 MED ORDER — ROSUVASTATIN CALCIUM 5 MG PO TABS: 5.0000 mg | ORAL_TABLET | Freq: Every day | ORAL | Status: DC

## 2016-01-06 NOTE — Telephone Encounter (Signed)
Onset-Friday night after taking one pill of the Simvastatin, patient states he started to develop a rash on both of his feet and radiated up to his knee. Patient did not take a dose on Saturday and started taking Benadryl at home to help relieve symptoms. Patient has [place] and would like to discuss other medications available due to side effects.

## 2016-01-06 NOTE — Telephone Encounter (Signed)
Tiffany called his pharmacy and generic Crestor is less than $7, he will get rx filled

## 2016-02-05 LAB — COMPREHENSIVE METABOLIC PANEL
A/G RATIO: 1.7 (ref 1.2–2.2)
ALT: 28 IU/L (ref 0–44)
AST: 48 IU/L — ABNORMAL HIGH (ref 0–40)
Albumin: 4.5 g/dL (ref 3.6–4.8)
Alkaline Phosphatase: 74 IU/L (ref 39–117)
BUN/Creatinine Ratio: 4 — ABNORMAL LOW (ref 10–24)
BUN: 5 mg/dL — ABNORMAL LOW (ref 8–27)
Bilirubin Total: 0.5 mg/dL (ref 0.0–1.2)
CO2: 25 mmol/L (ref 18–29)
Calcium: 9.4 mg/dL (ref 8.6–10.2)
Chloride: 97 mmol/L (ref 96–106)
Creatinine, Ser: 1.13 mg/dL (ref 0.76–1.27)
GFR, EST AFRICAN AMERICAN: 80 mL/min/{1.73_m2} (ref >59)
GFR, EST NON AFRICAN AMERICAN: 69 mL/min/{1.73_m2} (ref >59)
GLOBULIN, TOTAL: 2.6 g/dL (ref 1.5–4.5)
GLUCOSE: 90 mg/dL (ref 65–99)
Potassium: 4.1 mmol/L (ref 3.5–5.2)
SODIUM: 139 mmol/L (ref 134–144)
Total Protein: 7.1 g/dL (ref 6.0–8.5)

## 2016-02-05 LAB — LIPID PANEL
CHOL/HDL RATIO: 3.3 ratio (ref 0.0–5.0)
Cholesterol, Total: 184 mg/dL (ref 100–199)
HDL: 56 mg/dL (ref >39)
LDL CALC: 105 mg/dL — AB (ref 0–99)
TRIGLYCERIDES: 113 mg/dL (ref 0–149)
VLDL CHOLESTEROL CAL: 23 mg/dL (ref 5–40)

## 2016-02-05 LAB — CBC WITH DIFFERENTIAL/PLATELET
Basophils Absolute: 0 10*3/uL (ref 0.0–0.2)
Basos: 1 %
EOS (ABSOLUTE): 0.1 10*3/uL (ref 0.0–0.4)
Eos: 2 %
HEMATOCRIT: 44.3 % (ref 37.5–51.0)
Hemoglobin: 14.6 g/dL (ref 12.6–17.7)
Immature Grans (Abs): 0 10*3/uL (ref 0.0–0.1)
Immature Granulocytes: 0 %
LYMPHS ABS: 0.7 10*3/uL (ref 0.7–3.1)
Lymphs: 12 %
MCH: 32.2 pg (ref 26.6–33.0)
MCHC: 33 g/dL (ref 31.5–35.7)
MCV: 98 fL — ABNORMAL HIGH (ref 79–97)
MONOS ABS: 0.3 10*3/uL (ref 0.1–0.9)
Monocytes: 5 %
NEUTROS ABS: 4.7 10*3/uL (ref 1.4–7.0)
Neutrophils: 80 %
Platelets: 240 10*3/uL (ref 150–379)
RBC: 4.54 x10E6/uL (ref 4.14–5.80)
RDW: 14.4 % (ref 12.3–15.4)
WBC: 5.9 10*3/uL (ref 3.4–10.8)

## 2016-02-05 LAB — HIV ANTIBODY (ROUTINE TESTING W REFLEX): HIV Screen 4th Generation wRfx: NONREACTIVE

## 2016-02-05 LAB — HEPATITIS C ANTIBODY: Hep C Virus Ab: <0.1 s/co ratio (ref 0.0–0.9)

## 2016-02-05 LAB — PSA: PROSTATE SPECIFIC AG, SERUM: 0.1 ng/mL (ref 0.0–4.0)

## 2016-02-05 LAB — HEMOGLOBIN A1C
ESTIMATED AVERAGE GLUCOSE: 111 mg/dL
Hgb A1c MFr Bld: 5.5 % (ref 4.8–5.6)

## 2016-02-06 ENCOUNTER — Ambulatory Visit (INDEPENDENT_AMBULATORY_CARE_PROVIDER_SITE_OTHER): Payer: BLUE CROSS/BLUE SHIELD | Admitting: Family Medicine

## 2016-02-06 ENCOUNTER — Encounter: Payer: Self-pay | Admitting: Family Medicine

## 2016-02-06 VITALS — BP 136/82 | HR 86 | Temp 98.0°F | Resp 16 | Ht 68.0 in | Wt 205.5 lb

## 2016-02-06 DIAGNOSIS — I10 Essential (primary) hypertension: Secondary | ICD-10-CM | POA: Diagnosis not present

## 2016-02-06 DIAGNOSIS — Z1211 Encounter for screening for malignant neoplasm of colon: Secondary | ICD-10-CM

## 2016-02-06 DIAGNOSIS — R059 Cough, unspecified: Secondary | ICD-10-CM

## 2016-02-06 DIAGNOSIS — E785 Hyperlipidemia, unspecified: Secondary | ICD-10-CM | POA: Diagnosis not present

## 2016-02-06 DIAGNOSIS — C61 Malignant neoplasm of prostate: Secondary | ICD-10-CM

## 2016-02-06 DIAGNOSIS — R05 Cough: Secondary | ICD-10-CM | POA: Diagnosis not present

## 2016-02-06 DIAGNOSIS — Z23 Encounter for immunization: Secondary | ICD-10-CM | POA: Diagnosis not present

## 2016-02-06 MED ORDER — HYDROCOD POLST-CPM POLST ER 10-8 MG/5ML PO SUER: 5.0000 mL | Freq: Two times a day (BID) | ORAL | Status: DC | PRN

## 2016-02-06 MED ORDER — LOSARTAN POTASSIUM-HCTZ 100-25 MG PO TABS: 1.0000 | ORAL_TABLET | Freq: Every day | ORAL | Status: DC

## 2016-02-06 MED ORDER — FLUTICASONE FUROATE-VILANTEROL 100-25 MCG/INH IN AEPB: 1.0000 | INHALATION_SPRAY | Freq: Every day | RESPIRATORY_TRACT | Status: DC

## 2016-02-06 NOTE — Progress Notes (Signed)
Name: Carlos Conley   MRN: [id number]    DOB: 30-Nov-1950   Date:02/06/2016       Progress Note  Subjective  Chief Complaint  Chief Complaint  Patient presents with  . Follow-up    1 month BP Check   . Hypertension    patient stated that she has not had any neg sx, but he wakes up every morning coughing.  . Immunizations    Tdap & Zoster  . Orders    colorguard    HPI  HTN: He has been taking Norvasc for the past month, also taking Losartan/HCTZ 100/12.5. He is doing well, no side effect. BP is almost at goal, we will add HCTZ from 12.5 to 25 mg daily He denies chest pain or SOB  Prostate Cancer: sees Dr. Baruch Gouty, last PSA was 0.09 but he is concerned because it has gone up form 0.02, reviewed results with him and it has gone up again by 0.01, I sent a copy of results to Dr. Baruch Gouty  Hyperlipidemia: he stopped taking Crestor secondary to cost. He retired last year and had insurance change and prescription cost went up. He can't take Lipitor, Simvastatin. We tried Pravastatin and it also gave him a rash. He is back on Crestor, but generic prescription was not as expensive, and LDL is down to almost goal within one month of treatment.   Hyperglycemia: hgbA1C and glucose back to normal, no polyphagia, polydipsia or polyuria.   Elevated LFT: very mild bump, advised to stop drinking beer - or cut down on amount  Cough: started on week ago, no SOB, but has noticed mild intermittent wheezing, no fever, cough is productive and only happens at night. No fatigue or cold symptoms, except for some facial pressure last week  Patient Active Problem List   Diagnosis Date Noted  . Hyperlipidemia 01/02/2016  . Essential hypertension 01/02/2016  . Hyperglycemia 01/02/2016  . ED (erectile dysfunction) of organic origin 02/16/2013  . Prostate cancer ([place]) 06/14/2012    Past Surgical History  Procedure Laterality Date  . Prostate surgery  2002  . Prostate surgery  2002    History  reviewed. No pertinent family history.  Social History   Social History  . Marital Status: Married    Spouse Name: N/A  . Number of Children: N/A  . Years of Education: N/A   Occupational History  . Not on file.   Social History Main Topics  . Smoking status: Never Smoker   . Smokeless tobacco: Never Used  . Alcohol Use: 21.0 oz/week    35 Cans of beer per week  . Drug Use: No  . Sexual Activity: Not on file   Other Topics Concern  . Not on file   Social History Narrative     Current outpatient prescriptions:  .  amLODipine (NORVASC) 5 MG tablet, Take 0.5-1 tablets (2.5-5 mg total) by mouth daily., Disp: 90 tablet, Rfl: 0 .  aspirin EC 81 MG tablet, Take 1 tablet (81 mg total) by mouth daily., Disp: 30 tablet, Rfl: 0 .  fexofenadine (ALLEGRA ALLERGY) 180 MG tablet, Take 1 tablet (180 mg total) by mouth daily., Disp: 30 tablet, Rfl: 5 .  ibuprofen (ADVIL,MOTRIN) 800 MG tablet, Take 800 mg by mouth 2 (two) times daily as needed., Disp: , Rfl: 1 .  losartan-hydrochlorothiazide (HYZAAR) 100-25 MG tablet, Take 1 tablet by mouth daily., Disp: 30 tablet, Rfl: 2 .  rosuvastatin (CRESTOR) 5 MG tablet, Take 1 tablet (5 mg total) by  mouth daily., Disp: 30 tablet, Rfl: 2 .  sildenafil (VIAGRA) 100 MG tablet, Take 0.5-1 tablets (50-100 mg total) by mouth daily as needed for erectile dysfunction. (Patient not taking: Reported on 01/02/2016), Disp: 6 tablet, Rfl: 11  Allergies  Allergen Reactions  . Lipitor [Atorvastatin] Rash  . Pravastatin Itching, Rash and Other (See Comments)    Redness from feet up to knees.  . Simvastatin      ROS  Constitutional: Negative for fever or weight change.  Respiratory: Positive  for cough, but negative shortness of breath.   Cardiovascular: Negative for chest pain or palpitations.  Gastrointestinal: Negative for abdominal pain, no bowel changes.  Musculoskeletal: Negative for gait problem or joint swelling.  Skin: Negative for rash.   Neurological: Negative for dizziness or headache.  No other specific complaints in a complete review of systems (except as listed in HPI above).  Objective  Filed Vitals:   02/06/16 0925  BP: 144/78  Pulse: 86  Temp: 98 F (36.7 C)  TempSrc: Oral  Resp: 16  Height: 5\' 8"  (1.727 m)  Weight: 205 lb 8 oz (93.214 kg)  SpO2: 97%    Body mass index is 31.25 kg/(m^2).  Physical Exam  Constitutional: Patient appears well-developed and well-nourished. Obese  No distress.  HEENT: head atraumatic, normocephalic, pupils equal and reactive to light,  neck supple, throat within normal limits Cardiovascular: Normal rate, regular rhythm and normal heart sounds.  No murmur heard. No BLE edema. Pulmonary/Chest: Effort normal and breath sounds normal. No respiratory distress. Abdominal: Soft.  There is no tenderness. Psychiatric: Patient has a normal mood and affect. behavior is normal. Judgment and thought content normal.  Recent Results (from the past 2160 hour(s))  Lipid panel     Status: Abnormal   Collection Time: 02/04/16  9:39 AM  Result Value Ref Range   Cholesterol, Total 184 100 - 199 mg/dL   Triglycerides 113 0 - 149 mg/dL   HDL 56 >39 mg/dL   VLDL Cholesterol Cal 23 5 - 40 mg/dL   LDL Calculated 105 (H) 0 - 99 mg/dL   Chol/HDL Ratio 3.3 0.0 - 5.0 ratio units    Comment:                                   T. Chol/HDL Ratio                                             Men  Women                               1/2 Avg.Risk  3.4    3.3                                   Avg.Risk  5.0    4.4                                2X Avg.Risk  9.6    7.1  3X Avg.Risk 23.4   11.0   Comprehensive metabolic panel     Status: Abnormal   Collection Time: 02/04/16  9:39 AM  Result Value Ref Range   Glucose 90 65 - 99 mg/dL   BUN 5 (L) 8 - 27 mg/dL   Creatinine, Ser 1.13 0.76 - 1.27 mg/dL   GFR calc non Af Amer 69 >59 mL/min/1.73   GFR calc Af Amer 80 >59  mL/min/1.73   BUN/Creatinine Ratio 4 (L) 10 - 24   Sodium 139 134 - 144 mmol/L   Potassium 4.1 3.5 - 5.2 mmol/L   Chloride 97 96 - 106 mmol/L   CO2 25 18 - 29 mmol/L   Calcium 9.4 8.6 - 10.2 mg/dL   Total Protein 7.1 6.0 - 8.5 g/dL   Albumin 4.5 3.6 - 4.8 g/dL   Globulin, Total 2.6 1.5 - 4.5 g/dL   Albumin/Globulin Ratio 1.7 1.2 - 2.2   Bilirubin Total 0.5 0.0 - 1.2 mg/dL   Alkaline Phosphatase 74 39 - 117 IU/L   AST 48 (H) 0 - 40 IU/L   ALT 28 0 - 44 IU/L  Hepatitis C antibody     Status: None   Collection Time: 02/04/16  9:39 AM  Result Value Ref Range   Hep C Virus Ab <0.1 0.0 - 0.9 s/co ratio    Comment:                                   Negative:     < 0.8                              Indeterminate: 0.8 - 0.9                                   Positive:     > 0.9  The CDC recommends that a positive HCV antibody result  be followed up with a HCV Nucleic Acid Amplification  test [id number]).   HIV antibody     Status: None   Collection Time: 02/04/16  9:39 AM  Result Value Ref Range   HIV Screen 4th Generation wRfx Non Reactive Non Reactive  PSA     Status: None   Collection Time: 02/04/16  9:39 AM  Result Value Ref Range   Prostate Specific Ag, Serum 0.1 0.0 - 4.0 ng/mL    Comment: Roche ECLIA methodology. According to the American Urological Association, Serum PSA should decrease and remain at undetectable levels after radical prostatectomy. The AUA defines biochemical recurrence as an initial PSA value 0.2 ng/mL or greater followed by a subsequent confirmatory PSA value 0.2 ng/mL or greater. Values obtained with different assay methods or kits cannot be used interchangeably. Results cannot be interpreted as absolute evidence of the presence or absence of malignant disease.   CBC with Differential/Platelet     Status: Abnormal   Collection Time: 02/04/16  9:39 AM  Result Value Ref Range   WBC 5.9 3.4 - 10.8 x10E3/uL   RBC 4.54 4.14 - 5.80 x10E6/uL   Hemoglobin 14.6  12.6 - 17.7 g/dL   Hematocrit 44.3 37.5 - 51.0 %   MCV 98 (H) 79 - 97 fL   MCH 32.2 26.6 - 33.0 pg   MCHC 33.0 31.5 - 35.7 g/dL   RDW 14.4  12.3 - 15.4 %   Platelets 240 150 - 379 x10E3/uL   Neutrophils 80 %   Lymphs 12 %   Monocytes 5 %   Eos 2 %   Basos 1 %   Neutrophils Absolute 4.7 1.4 - 7.0 x10E3/uL   Lymphocytes Absolute 0.7 0.7 - 3.1 x10E3/uL   Monocytes Absolute 0.3 0.1 - 0.9 x10E3/uL   EOS (ABSOLUTE) 0.1 0.0 - 0.4 x10E3/uL   Basophils Absolute 0.0 0.0 - 0.2 x10E3/uL   Immature Granulocytes 0 %   Immature Grans (Abs) 0.0 0.0 - 0.1 x10E3/uL  Hemoglobin A1c     Status: None   Collection Time: 02/04/16  9:39 AM  Result Value Ref Range   Hgb A1c MFr Bld 5.5 4.8 - 5.6 %    Comment:          Pre-diabetes: 5.7 - 6.4          Diabetes: >6.4          Glycemic control for adults with diabetes: <7.0    Est. average glucose Bld gHb Est-mCnc 111 mg/dL      PHQ2/9: Depression screen [facility] 2/9 02/06/2016 01/02/2016 06/19/2015 03/18/2015  Decreased Interest 0 0 0 0  Down, Depressed, Hopeless 0 0 0 0  PHQ - 2 Score 0 0 0 0     Fall Risk: Fall Risk  02/06/2016 01/02/2016 06/19/2015 03/18/2015  Falls in the past year? No No No No     Functional Status Survey: Is the patient deaf or have difficulty hearing?: No Does the patient have difficulty seeing, even when wearing glasses/contacts?: No Does the patient have difficulty concentrating, remembering, or making decisions?: No Does the patient have difficulty walking or climbing stairs?: No Does the patient have difficulty dressing or bathing?: No Does the patient have difficulty doing errands alone such as visiting a doctor's office or shopping?: No   Assessment & Plan  1. Essential hypertension  We will increase dose of HCTZ, also discussed changing to Exforge HCTZ on his next visit to simplify his regiment - losartan-hydrochlorothiazide (HYZAAR) 100-25 MG tablet; Take 1 tablet by mouth daily.  Dispense: 30 tablet; Refill:  2  2. Hyperlipidemia  Continue Crestor   3. Need for tetanus booster  - Tdap vaccine greater than or equal to 7yo IM - not given, left before he received immunization   4. Need for shingles vaccine  - Varicella-zoster vaccine subcutaneous - not given - he left before receiving immunization  5. Prostate cancer [facility])  Continue follow up with Dr. Baruch Gouty  6. Colon cancer screening  Discussed cologuard and colonoscopy - he felt very sick during prep last time, however explained the kits are much smaller now - Ambulatory referral to Gastroenterology   7. Cough   discussed risk of narcotics with alcohol  - fluticasone furoate-vilanterol (BREO ELLIPTA) 100-25 MCG/INH AEPB; Inhale 1 puff into the lungs daily.  Dispense: 60 each; Refill: 0 - chlorpheniramine-HYDROcodone (TUSSIONEX PENNKINETIC ER) 10-8 MG/5ML SUER; Take 5 mLs by mouth every 12 (twelve) hours as needed.  Dispense: 140 mL; Refill: 0

## 2016-03-26 ENCOUNTER — Encounter: Payer: Self-pay | Admitting: Family Medicine

## 2016-03-26 ENCOUNTER — Ambulatory Visit (INDEPENDENT_AMBULATORY_CARE_PROVIDER_SITE_OTHER): Payer: BLUE CROSS/BLUE SHIELD | Admitting: Family Medicine

## 2016-03-26 VITALS — BP 142/80 | HR 80 | Temp 98.5°F | Resp 18 | Ht 68.0 in | Wt 206.2 lb

## 2016-03-26 DIAGNOSIS — I10 Essential (primary) hypertension: Secondary | ICD-10-CM

## 2016-03-26 DIAGNOSIS — Z23 Encounter for immunization: Secondary | ICD-10-CM | POA: Diagnosis not present

## 2016-03-26 DIAGNOSIS — N5231 Erectile dysfunction following radical prostatectomy: Secondary | ICD-10-CM

## 2016-03-26 DIAGNOSIS — Z0000 Encounter for general adult medical examination without abnormal findings: Secondary | ICD-10-CM

## 2016-03-26 MED ORDER — SILDENAFIL CITRATE 20 MG PO TABS
20.0000 mg | ORAL_TABLET | Freq: Every day | ORAL | 0 refills | Status: DC | PRN
Start: 2016-03-26 — End: 2016-06-02

## 2016-03-26 NOTE — Progress Notes (Signed)
Name: Carlos Conley   MRN: [id number]    DOB: 08-18-1951   Date:03/26/2016       Progress Note  Subjective  Chief Complaint  Chief Complaint  Patient presents with  . Annual Exam    HPI  Well male exam: he is feeling well, did not schedule colonoscopy, but states he will have it done this month. No chest pain, no SOB, cough resolved. He has good exercise tolerance - no SOB going up a set of steps.  ED secondary to prostatectomy, viagra works but is too expensive. Reviewed labs with patient  HTN: only taking Losartan/hctz. He stopped Norvasc since last visit. He states bp at home between 130's-140's/70's. No chest pain or palpitation, and he is scared of getting the bp below 130 because of family history of syncope from orthostatic changes.   Patient Active Problem List   Diagnosis Date Noted  . Hyperlipidemia 01/02/2016  . Essential hypertension 01/02/2016  . Hyperglycemia 01/02/2016  . ED (erectile dysfunction) of organic origin 02/16/2013  . Prostate cancer ([place]) 06/14/2012    Past Surgical History:  Procedure Laterality Date  . PROSTATE SURGERY  2002  . PROSTATE SURGERY  2002    Family History  Problem Relation Age of Onset  . Cancer Father   . Hypertension Sister     Social History   Social History  . Marital status: Married    Spouse name: N/A  . Number of children: N/A  . Years of education: N/A   Occupational History  . Not on file.   Social History Main Topics  . Smoking status: Never Smoker  . Smokeless tobacco: Never Used  . Alcohol use 10.8 oz/week    18 Cans of beer per week  . Drug use: No  . Sexual activity: Yes   Other Topics Concern  . Not on file   Social History Narrative  . No narrative on file     Current Outpatient Prescriptions:  .  aspirin EC 81 MG tablet, Take 1 tablet (81 mg total) by mouth daily., Disp: 30 tablet, Rfl: 0 .  fexofenadine (ALLEGRA ALLERGY) 180 MG tablet, Take 1 tablet (180 mg total) by mouth daily., Disp:  30 tablet, Rfl: 5 .  ibuprofen (ADVIL,MOTRIN) 800 MG tablet, Take 800 mg by mouth 2 (two) times daily as needed., Disp: , Rfl: 1 .  losartan-hydrochlorothiazide (HYZAAR) 100-25 MG tablet, Take 1 tablet by mouth daily., Disp: 30 tablet, Rfl: 2 .  rosuvastatin (CRESTOR) 5 MG tablet, Take 1 tablet (5 mg total) by mouth daily., Disp: 30 tablet, Rfl: 2 .  sildenafil (VIAGRA) 100 MG tablet, Take 0.5-1 tablets (50-100 mg total) by mouth daily as needed for erectile dysfunction. (Patient not taking: Reported on 01/02/2016), Disp: 6 tablet, Rfl: 11  Allergies  Allergen Reactions  . Lipitor [Atorvastatin] Rash  . Pravastatin Itching, Rash and Other (See Comments)    Redness from feet up to knees.  . Simvastatin      ROS  Constitutional: Negative for fever or weight change.  Respiratory: Negative for cough and shortness of breath.   Cardiovascular: Negative for chest pain or palpitations.  Gastrointestinal: Negative for abdominal pain, no bowel changes.  Musculoskeletal: Negative for gait problem or joint swelling.  Skin: Negative for rash.  Neurological: Negative for dizziness or headache.  No other specific complaints in a complete review of systems (except as listed in HPI above).  Objective  Vitals:   03/26/16 0827  BP: (!) 142/80  Pulse: 80  Resp: 18  Temp: 98.5 F (36.9 C)  SpO2: 95%  Weight: 206 lb 3 oz (93.5 kg)  Height: 5\' 8"  (1.727 m)    Body mass index is 31.35 kg/m.  Physical Exam  Constitutional: Patient appears well-developed and well-nourished. No distress.  HENT: Head: Normocephalic and atraumatic. Ears: B TMs ok, no erythema or effusion; Nose: Nose normal. Mouth/Throat: Oropharynx is clear and moist. No oropharyngeal exudate.  Eyes: Conjunctivae and EOM are normal. Pupils are equal, round, and reactive to light. No scleral icterus.  Neck: Normal range of motion. Neck supple. No JVD present. No thyromegaly present.  Cardiovascular: Normal rate, regular rhythm and  normal heart sounds.  No murmur heard. No BLE edema. Pulmonary/Chest: Effort normal and breath sounds normal. No respiratory distress. Abdominal: Soft. Bowel sounds are normal, no distension. There is no tenderness. no masses MALE GENITALIA: Normal descended testes bilaterally, no masses palpated, no hernias, no lesions, no discharge Uncircumcised RECTAL: not done Musculoskeletal: Normal range of motion, no joint effusions. No gross deformities Neurological: he is alert and oriented to person, place, and time. No cranial nerve deficit. Coordination, balance, strength, speech and gait are normal.  Skin: Skin is warm and dry. No rash noted. No erythema.  Psychiatric: Patient has a normal mood and affect. behavior is normal. Judgment and thought content normal.  Recent Results (from the past 2160 hour(s))  Lipid panel     Status: Abnormal   Collection Time: 02/04/16  9:39 AM  Result Value Ref Range   Cholesterol, Total 184 100 - 199 mg/dL   Triglycerides 113 0 - 149 mg/dL   HDL 56 >39 mg/dL   VLDL Cholesterol Cal 23 5 - 40 mg/dL   LDL Calculated 105 (H) 0 - 99 mg/dL   Chol/HDL Ratio 3.3 0.0 - 5.0 ratio units    Comment:                                   T. Chol/HDL Ratio                                             Men  Women                               1/2 Avg.Risk  3.4    3.3                                   Avg.Risk  5.0    4.4                                2X Avg.Risk  9.6    7.1                                3X Avg.Risk 23.4   11.0   Comprehensive metabolic panel     Status: Abnormal   Collection Time: 02/04/16  9:39 AM  Result Value Ref Range   Glucose 90 65 - 99 mg/dL   BUN 5 (L) 8 - 27 mg/dL   Creatinine, Ser 1.13  0.76 - 1.27 mg/dL   GFR calc non Af Amer 69 >59 mL/min/1.73   GFR calc Af Amer 80 >59 mL/min/1.73   BUN/Creatinine Ratio 4 (L) 10 - 24   Sodium 139 134 - 144 mmol/L   Potassium 4.1 3.5 - 5.2 mmol/L   Chloride 97 96 - 106 mmol/L   CO2 25 18 - 29 mmol/L    Calcium 9.4 8.6 - 10.2 mg/dL   Total Protein 7.1 6.0 - 8.5 g/dL   Albumin 4.5 3.6 - 4.8 g/dL   Globulin, Total 2.6 1.5 - 4.5 g/dL   Albumin/Globulin Ratio 1.7 1.2 - 2.2   Bilirubin Total 0.5 0.0 - 1.2 mg/dL   Alkaline Phosphatase 74 39 - 117 IU/L   AST 48 (H) 0 - 40 IU/L   ALT 28 0 - 44 IU/L  Hepatitis C antibody     Status: None   Collection Time: 02/04/16  9:39 AM  Result Value Ref Range   Hep C Virus Ab <0.1 0.0 - 0.9 s/co ratio    Comment:                                   Negative:     < 0.8                              Indeterminate: 0.8 - 0.9                                   Positive:     > 0.9  The CDC recommends that a positive HCV antibody result  be followed up with a HCV Nucleic Acid Amplification  test [id number]).   HIV antibody     Status: None   Collection Time: 02/04/16  9:39 AM  Result Value Ref Range   HIV Screen 4th Generation wRfx Non Reactive Non Reactive  PSA     Status: None   Collection Time: 02/04/16  9:39 AM  Result Value Ref Range   Prostate Specific Ag, Serum 0.1 0.0 - 4.0 ng/mL    Comment: Roche ECLIA methodology. According to the American Urological Association, Serum PSA should decrease and remain at undetectable levels after radical prostatectomy. The AUA defines biochemical recurrence as an initial PSA value 0.2 ng/mL or greater followed by a subsequent confirmatory PSA value 0.2 ng/mL or greater. Values obtained with different assay methods or kits cannot be used interchangeably. Results cannot be interpreted as absolute evidence of the presence or absence of malignant disease.   CBC with Differential/Platelet     Status: Abnormal   Collection Time: 02/04/16  9:39 AM  Result Value Ref Range   WBC 5.9 3.4 - 10.8 x10E3/uL   RBC 4.54 4.14 - 5.80 x10E6/uL   Hemoglobin 14.6 12.6 - 17.7 g/dL   Hematocrit 44.3 37.5 - 51.0 %   MCV 98 (H) 79 - 97 fL   MCH 32.2 26.6 - 33.0 pg   MCHC 33.0 31.5 - 35.7 g/dL   RDW 14.4 12.3 - 15.4 %   Platelets 240  150 - 379 x10E3/uL   Neutrophils 80 %   Lymphs 12 %   Monocytes 5 %   Eos 2 %   Basos 1 %   Neutrophils Absolute 4.7 1.4 - 7.0 x10E3/uL   Lymphocytes Absolute 0.7 0.7 -  3.1 x10E3/uL   Monocytes Absolute 0.3 0.1 - 0.9 x10E3/uL   EOS (ABSOLUTE) 0.1 0.0 - 0.4 x10E3/uL   Basophils Absolute 0.0 0.0 - 0.2 x10E3/uL   Immature Granulocytes 0 %   Immature Grans (Abs) 0.0 0.0 - 0.1 x10E3/uL  Hemoglobin A1c     Status: None   Collection Time: 02/04/16  9:39 AM  Result Value Ref Range   Hgb A1c MFr Bld 5.5 4.8 - 5.6 %    Comment:          Pre-diabetes: 5.7 - 6.4          Diabetes: >6.4          Glycemic control for adults with diabetes: <7.0    Est. average glucose Bld gHb Est-mCnc 111 mg/dL      PHQ2/9: Depression screen [facility] 2/9 03/26/2016 02/06/2016 01/02/2016 06/19/2015 03/18/2015  Decreased Interest 0 0 0 0 0  Down, Depressed, Hopeless 0 0 0 0 0  PHQ - 2 Score 0 0 0 0 0    Fall Risk: Fall Risk  03/26/2016 02/06/2016 01/02/2016 06/19/2015 03/18/2015  Falls in the past year? No No No No No   functional Status Survey: Is the patient deaf or have difficulty hearing?: No Does the patient have difficulty seeing, even when wearing glasses/contacts?: No Does the patient have difficulty concentrating, remembering, or making decisions?: No Does the patient have difficulty walking or climbing stairs?: No Does the patient have difficulty dressing or bathing?: No Does the patient have difficulty doing errands alone such as visiting a doctor's office or shopping?: No    Assessment & Plan  1. Encounter for routine history and physical exam for male  Discussed importance of 150 minutes of physical activity weekly, eat two servings of fish weekly, eat one serving of tree nuts ( cashews, pistachios, pecans, almonds.Marland Kitchen) every other day, eat 6 servings of fruit/vegetables daily and drink plenty of water and avoid sweet beverages. Drink less beer  2. Need for tetanus booster  - Tdap vaccine greater  than or equal to 7yo IM  3. Need for shingles vaccine  - Varicella-zoster vaccine subcutaneous  4. Essential hypertension  He would like to hold off on changing medication at this time.   5. Erectile dysfunction following radical prostatectomy  - sildenafil (REVATIO) 20 MG tablet; Take 1-4 tablets (20-80 mg total) by mouth daily as needed.  Dispense: 100 tablet; Refill: 0

## 2016-04-25 ENCOUNTER — Other Ambulatory Visit: Payer: Self-pay | Admitting: Family Medicine

## 2016-04-25 DIAGNOSIS — I10 Essential (primary) hypertension: Secondary | ICD-10-CM

## 2016-06-02 ENCOUNTER — Encounter: Payer: Self-pay | Admitting: Family Medicine

## 2016-06-02 ENCOUNTER — Ambulatory Visit (INDEPENDENT_AMBULATORY_CARE_PROVIDER_SITE_OTHER): Payer: BLUE CROSS/BLUE SHIELD | Admitting: Family Medicine

## 2016-06-02 VITALS — BP 118/80 | Temp 98.4°F | Wt 207.3 lb

## 2016-06-02 DIAGNOSIS — E7800 Pure hypercholesterolemia, unspecified: Secondary | ICD-10-CM | POA: Diagnosis not present

## 2016-06-02 DIAGNOSIS — I10 Essential (primary) hypertension: Secondary | ICD-10-CM

## 2016-06-02 DIAGNOSIS — N5231 Erectile dysfunction following radical prostatectomy: Secondary | ICD-10-CM

## 2016-06-02 DIAGNOSIS — R739 Hyperglycemia, unspecified: Secondary | ICD-10-CM

## 2016-06-02 DIAGNOSIS — Z23 Encounter for immunization: Secondary | ICD-10-CM

## 2016-06-02 MED ORDER — LOSARTAN POTASSIUM-HCTZ 100-25 MG PO TABS: 1.0000 | ORAL_TABLET | Freq: Every day | ORAL | 1 refills | Status: DC

## 2016-06-02 MED ORDER — ROSUVASTATIN CALCIUM 5 MG PO TABS: 5.0000 mg | ORAL_TABLET | Freq: Every day | ORAL | 1 refills | Status: DC

## 2016-06-02 MED ORDER — SILDENAFIL CITRATE 20 MG PO TABS: 20.0000 mg | ORAL_TABLET | Freq: Every day | ORAL | 0 refills | Status: DC | PRN

## 2016-06-02 NOTE — Progress Notes (Signed)
Name: Carlos Conley   MRN: [id number]    DOB: [birth year]   Date:06/02/2016       Progress Note  Subjective  Chief Complaint  Chief Complaint  Patient presents with  . Follow-up    HPI   HTN: He is currently only taking Hyzaar hctz 100/25 mg daily and bp is at goal. He is doing well, no side effect. BP at home is usually 130's/80's.No chest pain or palpitation. Denies decreased in exercise tolerance  Prostate Cancer: sees Dr. Baruch Gouty, last PSA was 0.09 but he is concerned because it has gone up form 0.02, reviewed results with him and it has gone up again by 0.01, I sent a copy of results to Dr. Baruch Gouty. He has a follow up in February  Hyperlipidemia: He has been taking Crestor daily , last LDL was 105, HDL 56, he denies muscle aches. Continue current regiment, and also advised to follow a health life style   Hyperglycemia: last hgbA1C was normal at 5.5%, no polyphagia, polyuria or polydipsia    Patient Active Problem List   Diagnosis Date Noted  . Hyperlipidemia 01/02/2016  . Essential hypertension 01/02/2016  . Hyperglycemia 01/02/2016  . ED (erectile dysfunction) of organic origin 02/16/2013  . Prostate cancer ([place]) 06/14/2012    Past Surgical History:  Procedure Laterality Date  . PROSTATE SURGERY  2002  . PROSTATE SURGERY  2002    Family History  Problem Relation Age of Onset  . Cancer Father   . Hypertension Sister     Social History   Social History  . Marital status: Married    Spouse name: N/A  . Number of children: N/A  . Years of education: N/A   Occupational History  . Not on file.   Social History Main Topics  . Smoking status: Never Smoker  . Smokeless tobacco: Never Used  . Alcohol use 10.8 oz/week    18 Cans of beer per week  . Drug use: No  . Sexual activity: Yes   Other Topics Concern  . Not on file   Social History Narrative  . No narrative on file     Current Outpatient Prescriptions:  .  aspirin EC 81 MG tablet,  Take 1 tablet (81 mg total) by mouth daily., Disp: 30 tablet, Rfl: 0 .  fexofenadine (ALLEGRA ALLERGY) 180 MG tablet, Take 1 tablet (180 mg total) by mouth daily., Disp: 30 tablet, Rfl: 5 .  losartan-hydrochlorothiazide (HYZAAR) 100-25 MG tablet, Take 1 tablet by mouth daily., Disp: 90 tablet, Rfl: 1 .  rosuvastatin (CRESTOR) 5 MG tablet, Take 1 tablet (5 mg total) by mouth daily., Disp: 90 tablet, Rfl: 1 .  sildenafil (REVATIO) 20 MG tablet, Take 1-4 tablets (20-80 mg total) by mouth daily as needed., Disp: 100 tablet, Rfl: 0  Allergies  Allergen Reactions  . Lipitor [Atorvastatin] Rash  . Pravastatin Itching, Rash and Other (See Comments)    Redness from feet up to knees.  . Simvastatin      ROS  Constitutional: Negative for fever or weight change.  Respiratory: Negative for cough and shortness of breath.   Cardiovascular: Negative for chest pain or palpitations.  Gastrointestinal: Negative for abdominal pain, no bowel changes.  Musculoskeletal: Negative for gait problem or joint swelling.  Skin: Negative for rash.  Neurological: Negative for dizziness or headache.  No other specific complaints in a complete review of systems (except as listed in HPI above).  Objective  Vitals:   06/02/16 0810  BP: 118/80  Temp: 98.4 F (36.9 C)  SpO2: 97%  Weight: 207 lb 4.8 oz (94 kg)    Body mass index is 31.52 kg/m.  Physical Exam  Constitutional: Patient appears well-developed and well-nourished. Obese No distress.  HEENT: head atraumatic, normocephalic, pupils equal and reactive to light,  neck supple, throat within normal limits Cardiovascular: Normal rate, regular rhythm and normal heart sounds.  No murmur heard. No BLE edema. Pulmonary/Chest: Effort normal and breath sounds normal. No respiratory distress. Abdominal: Soft.  There is no tenderness. Psychiatric: Patient has a normal mood and affect. behavior is normal. Judgment and thought content normal.  PHQ2/9: Depression  screen [facility] 2/9 06/02/2016 03/26/2016 02/06/2016 01/02/2016 06/19/2015  Decreased Interest 0 0 0 0 0  Down, Depressed, Hopeless 0 0 0 0 0  PHQ - 2 Score 0 0 0 0 0    Fall Risk: Fall Risk  06/02/2016 03/26/2016 02/06/2016 01/02/2016 06/19/2015  Falls in the past year? No No No No No    Functional Status Survey: Is the patient deaf or have difficulty hearing?: No Does the patient have difficulty seeing, even when wearing glasses/contacts?: Yes (glasses) Does the patient have difficulty concentrating, remembering, or making decisions?: No Does the patient have difficulty walking or climbing stairs?: No Does the patient have difficulty dressing or bathing?: No Does the patient have difficulty doing errands alone such as visiting a doctor's office or shopping?: No    Assessment & Plan  1. Essential hypertension  Doing well, off Norvasc - losartan-hydrochlorothiazide (HYZAAR) 100-25 MG tablet; Take 1 tablet by mouth daily.  Dispense: 90 tablet; Refill: 1  2. Needs flu shot  - Flu Vaccine QUAD 36+ mos PF IM (Fluarix & Fluzone Quad PF)  3. Erectile dysfunction following radical prostatectomy  - sildenafil (REVATIO) 20 MG tablet; Take 1-4 tablets (20-80 mg total) by mouth daily as needed.  Dispense: 100 tablet; Refill: 0  4. Hyperglycemia  Doing well, avoid carbohydrates  5. Pure hypercholesterolemia  - rosuvastatin (CRESTOR) 5 MG tablet; Take 1 tablet (5 mg total) by mouth daily.  Dispense: 90 tablet; Refill: 1

## 2016-06-11 ENCOUNTER — Other Ambulatory Visit: Payer: Self-pay

## 2016-06-11 ENCOUNTER — Telehealth: Payer: Self-pay

## 2016-06-11 NOTE — Telephone Encounter (Signed)
Z12.11 Screening Colonoscopy   [id number] [facility] BCBS Pre cert is not required

## 2016-06-11 NOTE — Telephone Encounter (Signed)
Gastroenterology Pre-Procedure Review  Request Date: 07/28/2016 Requesting Physician: Dr. Ancil Boozer  PATIENT REVIEW QUESTIONS: The patient responded to the following health history questions as indicated:    1. Are you having any GI issues? no 2. Do you have a personal history of Polyps? no 3. Do you have a family history of Colon Cancer or Polyps? no 4. Diabetes Mellitus? no 5. Joint replacements in the past 12 months?no 6. Major health problems in the past 3 months?no 7. Any artificial heart valves, MVP, or defibrillator?no    MEDICATIONS & ALLERGIES:    Patient reports the following regarding taking any anticoagulation/antiplatelet therapy:   Plavix, Coumadin, Eliquis, Xarelto, Lovenox, Pradaxa, Brilinta, or Effient? no Aspirin? yes ([facility])  Patient confirms/reports the following medications:  Current Outpatient Prescriptions  Medication Sig Dispense Refill  . aspirin EC 81 MG tablet Take 1 tablet (81 mg total) by mouth daily. 30 tablet 0  . losartan-hydrochlorothiazide (HYZAAR) 100-25 MG tablet Take 1 tablet by mouth daily. 90 tablet 1   No current facility-administered medications for this visit.     Patient confirms/reports the following allergies:  Allergies  Allergen Reactions  . Lipitor [Atorvastatin] Rash  . Pravastatin Itching, Rash and Other (See Comments)    Redness from feet up to knees.  . Simvastatin     No orders of the defined types were placed in this encounter.   AUTHORIZATION INFORMATION Primary Insurance: 1D#: Group #:  Secondary Insurance: 1D#: Group #:  SCHEDULE INFORMATION: Date: 07/28/2016 Time: Location: [facility]

## 2016-07-27 ENCOUNTER — Encounter: Payer: Self-pay | Admitting: *Deleted

## 2016-07-28 ENCOUNTER — Encounter: Payer: Self-pay | Admitting: Certified Registered Nurse Anesthetist

## 2016-07-28 ENCOUNTER — Ambulatory Visit: Payer: BLUE CROSS/BLUE SHIELD | Admitting: Anesthesiology

## 2016-07-28 ENCOUNTER — Encounter
Admission: RE | Disposition: A | Discharge status: Home / Self Care | Payer: Self-pay | Source: Ambulatory Visit | Attending: Gastroenterology

## 2016-07-28 ENCOUNTER — Ambulatory Visit
Admission: RE | Admit: 2016-07-28 | Discharge: 2016-07-28 | Disposition: A | Discharge status: Home / Self Care | Payer: BLUE CROSS/BLUE SHIELD | Source: Ambulatory Visit | Attending: Gastroenterology | Admitting: Gastroenterology

## 2016-07-28 DIAGNOSIS — Z1211 Encounter for screening for malignant neoplasm of colon: Secondary | ICD-10-CM | POA: Diagnosis not present

## 2016-07-28 DIAGNOSIS — Z9889 Other specified postprocedural states: Secondary | ICD-10-CM | POA: Insufficient documentation

## 2016-07-28 DIAGNOSIS — R12 Heartburn: Secondary | ICD-10-CM

## 2016-07-28 DIAGNOSIS — D123 Benign neoplasm of transverse colon: Secondary | ICD-10-CM | POA: Diagnosis not present

## 2016-07-28 DIAGNOSIS — K641 Second degree hemorrhoids: Secondary | ICD-10-CM | POA: Insufficient documentation

## 2016-07-28 DIAGNOSIS — K635 Polyp of colon: Secondary | ICD-10-CM | POA: Diagnosis not present

## 2016-07-28 DIAGNOSIS — K2100 Gastro-esophageal reflux disease with esophagitis, without bleeding: Secondary | ICD-10-CM

## 2016-07-28 DIAGNOSIS — E785 Hyperlipidemia, unspecified: Secondary | ICD-10-CM | POA: Insufficient documentation

## 2016-07-28 DIAGNOSIS — I10 Essential (primary) hypertension: Secondary | ICD-10-CM | POA: Diagnosis not present

## 2016-07-28 DIAGNOSIS — Z7982 Long term (current) use of aspirin: Secondary | ICD-10-CM | POA: Diagnosis not present

## 2016-07-28 DIAGNOSIS — Z8249 Family history of ischemic heart disease and other diseases of the circulatory system: Secondary | ICD-10-CM | POA: Diagnosis not present

## 2016-07-28 DIAGNOSIS — Z888 Allergy status to other drugs, medicaments and biological substances status: Secondary | ICD-10-CM | POA: Insufficient documentation

## 2016-07-28 DIAGNOSIS — Z79899 Other long term (current) drug therapy: Secondary | ICD-10-CM | POA: Insufficient documentation

## 2016-07-28 HISTORY — PX: COLONOSCOPY WITH PROPOFOL: SHX5780

## 2016-07-28 SURGERY — COLONOSCOPY WITH PROPOFOL
Anesthesia: General

## 2016-07-28 MED ORDER — MIDAZOLAM HCL 2 MG/2ML IJ SOLN
INTRAMUSCULAR | Status: DC | PRN
  Administered 2016-07-28: 1 mg via INTRAVENOUS

## 2016-07-28 MED ORDER — LIDOCAINE HCL (CARDIAC) 10 MG/ML IV SOLN
INTRAVENOUS | Status: DC | PRN
  Administered 2016-07-28: 30 mg via INTRAVENOUS

## 2016-07-28 MED ORDER — PROPOFOL 500 MG/50ML IV EMUL
INTRAVENOUS | Status: DC | PRN
  Administered 2016-07-28: 120 ug/kg/min via INTRAVENOUS

## 2016-07-28 MED ORDER — EPHEDRINE SULFATE 50 MG/ML IJ SOLN
INTRAMUSCULAR | Status: DC | PRN
  Administered 2016-07-28: 5 mg via INTRAVENOUS

## 2016-07-28 MED ORDER — GLYCOPYRROLATE 0.2 MG/ML IJ SOLN
INTRAMUSCULAR | Status: DC | PRN
  Administered 2016-07-28: 0.2 mg via INTRAVENOUS

## 2016-07-28 MED ORDER — SODIUM CHLORIDE 0.9 % IV SOLN
INTRAVENOUS | Status: DC
  Administered 2016-07-28: 1000 mL via INTRAVENOUS

## 2016-07-28 MED ORDER — PROPOFOL 10 MG/ML IV BOLUS
INTRAVENOUS | Status: DC | PRN
  Administered 2016-07-28: 30 mg via INTRAVENOUS

## 2016-07-28 NOTE — Anesthesia Postprocedure Evaluation (Signed)
Anesthesia Post Note  Patient: Carlos Conley  Procedure(s) Performed: Procedure(s) (LRB): COLONOSCOPY WITH PROPOFOL (N/A)  Patient location during evaluation: Endoscopy Anesthesia Type: General Level of consciousness: awake and alert and oriented Pain management: pain level controlled Vital Signs Assessment: post-procedure vital signs reviewed and stable Respiratory status: spontaneous breathing, nonlabored ventilation and respiratory function stable Cardiovascular status: blood pressure returned to baseline and stable Postop Assessment: no signs of nausea or vomiting Anesthetic complications: no    Last Vitals:  Vitals:   07/28/16 1040 07/28/16 1050  BP: 118/80 124/84  Pulse:  72  Resp:  (!) 22  Temp:      Last Pain:  Vitals:   07/28/16 1022  TempSrc: Tympanic                 Clorene Nerio

## 2016-07-28 NOTE — Transfer of Care (Signed)
Immediate Anesthesia Transfer of Care Note  Patient: Carlos Conley  Procedure(s) Performed: Procedure(s): COLONOSCOPY WITH PROPOFOL (N/A)  Patient Location: PACU  Anesthesia Type:General  Level of Consciousness: sedated  Airway & Oxygen Therapy: Patient Spontanous Breathing and Patient connected to nasal cannula oxygen  Post-op Assessment: Report given to RN and Post -op Vital signs reviewed and stable  Post vital signs: Reviewed and stable  Last Vitals:  Vitals:   07/28/16 0854 07/28/16 1022  BP: (!) 151/101 109/77  Pulse: 81 76  Resp: 16 18  Temp: 37.1 C (!) 35.7 C    Last Pain:  Vitals:   07/28/16 1022  TempSrc: Tympanic         Complications: No apparent anesthesia complications

## 2016-07-28 NOTE — Anesthesia Preprocedure Evaluation (Signed)
Anesthesia Evaluation  Patient identified by MRN, date of birth, ID band Patient awake    Reviewed: Allergy & Precautions, NPO status , Patient's Chart, lab work & pertinent test results  History of Anesthesia Complications Negative for: history of anesthetic complications  Airway Mallampati: II  TM Distance: >3 FB Neck ROM: Full    Dental  (+) Partial Lower, Poor Dentition   Pulmonary neg pulmonary ROS, neg sleep apnea, neg COPD,    breath sounds clear to auscultation- rhonchi (-) wheezing      Cardiovascular Exercise Tolerance: Good hypertension, Pt. on medications (-) CAD and (-) Past MI  Rhythm:Regular Rate:Normal - Systolic murmurs and - Diastolic murmurs    Neuro/Psych negative neurological ROS  negative psych ROS   GI/Hepatic negative GI ROS, Neg liver ROS,   Endo/Other  negative endocrine ROSneg diabetes  Renal/GU negative Renal ROS     Musculoskeletal negative musculoskeletal ROS (+)   Abdominal (+) + obese,   Peds  Hematology negative hematology ROS (+)   Anesthesia Other Findings Past Medical History: No date: Cancer ([place]) No date: Hyperlipidemia No date: Hypertension   Reproductive/Obstetrics                             Anesthesia Physical Anesthesia Plan  ASA: II  Anesthesia Plan: General   Post-op Pain Management:    Induction: Intravenous  Airway Management Planned: Natural Airway  Additional Equipment:   Intra-op Plan:   Post-operative Plan:   Informed Consent: I have reviewed the patients History and Physical, chart, labs and discussed the procedure including the risks, benefits and alternatives for the proposed anesthesia with the patient or authorized representative who has indicated his/her understanding and acceptance.   Dental advisory given  Plan Discussed with: CRNA and Anesthesiologist  Anesthesia Plan Comments:         Anesthesia Quick  Evaluation

## 2016-07-28 NOTE — Op Note (Signed)
[facility] Gastroenterology Patient Name: Carlos Conley Procedure Date: 07/28/2016 9:39 AM MRN: [id number] Account #: [id number] Date of Birth: August 05, 1951 Admit Type: Outpatient Age: 65 Room: [facility] ENDO ROOM 4 Gender: Male Note Status: Finalized Procedure:            Colonoscopy Indications:          Screening for colorectal malignant neoplasm Providers:            Lucilla Lame MD, MD Referring MD:         Ashok Norris, MD (Referring MD) Medicines:            Propofol per Anesthesia Complications:        No immediate complications. Procedure:            Pre-Anesthesia Assessment:                       - Prior to the procedure, a History and Physical was                        performed, and patient medications and allergies were                        reviewed. The patient's tolerance of previous                        anesthesia was also reviewed. The risks and benefits of                        the procedure and the sedation options and risks were                        discussed with the patient. All questions were                        answered, and informed consent was obtained. Prior                        Anticoagulants: The patient has taken no previous                        anticoagulant or antiplatelet agents. ASA Grade                        Assessment: II - A patient with mild systemic disease.                        After reviewing the risks and benefits, the patient was                        deemed in satisfactory condition to undergo the                        procedure.                       After obtaining informed consent, the colonoscope was                        passed under direct vision. Throughout the procedure,  the patient's blood pressure, pulse, and oxygen                        saturations were monitored continuously. The                        Colonoscope was introduced through the anus and           advanced to the the cecum, identified by appendiceal                        orifice and ileocecal valve. The colonoscopy was                        performed without difficulty. The patient tolerated the                        procedure well. The quality of the bowel preparation                        was excellent. Findings:      The perianal and digital rectal examinations were normal.      A 2 mm polyp was found in the transverse colon. The polyp was sessile.       The polyp was removed with a cold biopsy forceps. Resection and       retrieval were complete.      Non-bleeding internal hemorrhoids were found during retroflexion. The       hemorrhoids were Grade II (internal hemorrhoids that prolapse but reduce       spontaneously). Impression:           - One 2 mm polyp in the transverse colon, removed with                        a cold biopsy forceps. Resected and retrieved.                       - Non-bleeding internal hemorrhoids. Recommendation:       - Discharge patient to home.                       - Resume previous diet.                       - Continue present medications.                       - Repeat colonoscopy in 10 years for screening unless                        any change in family history or lower GI problems. Procedure Code(s):    --- Professional ---                       [phone], Colonoscopy, flexible; with biopsy, single or                        multiple Diagnosis Code(s):    --- Professional ---                       Z12.11, Encounter for screening for malignant neoplasm  of colon                       D12.3, Benign neoplasm of transverse colon (hepatic                        flexure or splenic flexure) CPT copyright 2016 American Medical Association. All rights reserved. The codes documented in this report are preliminary and upon coder review may  be revised to meet current compliance requirements. Lucilla Lame MD, MD 07/28/2016  10:19:31 AM This report has been signed electronically. Number of Addenda: 0 Note Initiated On: 07/28/2016 9:39 AM Scope Withdrawal Time: 0 hours 4 minutes 43 seconds  Total Procedure Duration: 0 hours 22 minutes 17 seconds       [facility]

## 2016-07-28 NOTE — H&P (Signed)
  Carlos Lame, MD [facility] [address] [place], [place] [address] Phone: [phone] Fax : [phone]  Primary Care Physician:  Ashok Norris, MD Primary Gastroenterologist:  Dr. Allen Norris  Pre-Procedure History & Physical: HPI:  Carlos Conley is a 65 y.o. male is here for a screening colonoscopy.   Past Medical History:  Diagnosis Date  . Cancer ([place])   . Hyperlipidemia   . Hypertension     Past Surgical History:  Procedure Laterality Date  . PROSTATE SURGERY  2002  . PROSTATE SURGERY  2002    Prior to Admission medications   Medication Sig Start Date End Date Taking? Authorizing Provider  aspirin EC 81 MG tablet Take 1 tablet (81 mg total) by mouth daily. 01/02/16  Yes Steele Sizer, MD  losartan-hydrochlorothiazide (HYZAAR) 100-25 MG tablet Take 1 tablet by mouth daily. 06/02/16  Yes Steele Sizer, MD    Allergies as of 06/11/2016 - Review Complete 06/11/2016  Allergen Reaction Noted  . Lipitor [atorvastatin] Rash 07/29/2012  . Pravastatin Itching, Rash, and Other (See Comments) 02/06/2016  . Simvastatin  01/06/2016    Family History  Problem Relation Age of Onset  . Cancer Father   . Hypertension Sister     Social History   Social History  . Marital status: Married    Spouse name: N/A  . Number of children: N/A  . Years of education: N/A   Occupational History  . Not on file.   Social History Main Topics  . Smoking status: Never Smoker  . Smokeless tobacco: Never Used  . Alcohol use 10.8 oz/week    18 Cans of beer per week  . Drug use: No  . Sexual activity: Yes   Other Topics Concern  . Not on file   Social History Narrative  . No narrative on file    Review of Systems: See HPI, otherwise negative ROS  Physical Exam: BP (!) 151/101   Pulse 81   Temp 98.7 F (37.1 C) (Tympanic)   Resp 16   Ht 5\' 8"  (1.727 m)   Wt 200 lb (90.7 kg)   SpO2 99%   BMI 30.41 kg/m  General:   Alert,  pleasant and cooperative in NAD Head:   Normocephalic and atraumatic. Neck:  Supple; no masses or thyromegaly. Lungs:  Clear throughout to auscultation.    Heart:  Regular rate and rhythm. Abdomen:  Soft, nontender and nondistended. Normal bowel sounds, without guarding, and without rebound.   Neurologic:  Alert and  oriented x4;  grossly normal neurologically.  Impression/Plan: [name] is now here to undergo a screening colonoscopy.  Risks, benefits, and alternatives regarding colonoscopy have been reviewed with the patient.  Questions have been answered.  All parties agreeable.

## 2016-07-28 NOTE — Anesthesia Procedure Notes (Signed)
Date/Time: 07/28/2016 9:41 AM Performed by: Johnna Acosta Pre-anesthesia Checklist: Patient identified, Emergency Drugs available, Suction available, Patient being monitored and Timeout performed Patient Re-evaluated:Patient Re-evaluated prior to inductionOxygen Delivery Method: Nasal cannula

## 2016-07-29 ENCOUNTER — Encounter: Payer: Self-pay | Admitting: Gastroenterology

## 2016-07-29 LAB — SURGICAL PATHOLOGY

## 2016-07-30 ENCOUNTER — Encounter: Payer: Self-pay | Admitting: Gastroenterology

## 2016-09-14 ENCOUNTER — Encounter: Payer: Self-pay | Admitting: *Deleted

## 2016-10-08 ENCOUNTER — Ambulatory Visit (INDEPENDENT_AMBULATORY_CARE_PROVIDER_SITE_OTHER): Payer: BLUE CROSS/BLUE SHIELD | Admitting: Family Medicine

## 2016-10-08 ENCOUNTER — Encounter: Payer: Self-pay | Admitting: Family Medicine

## 2016-10-08 VITALS — BP 142/80 | HR 86 | Temp 98.3°F | Resp 16 | Ht 68.0 in | Wt 211.1 lb

## 2016-10-08 DIAGNOSIS — M25551 Pain in right hip: Secondary | ICD-10-CM

## 2016-10-08 DIAGNOSIS — C61 Malignant neoplasm of prostate: Secondary | ICD-10-CM

## 2016-10-08 MED ORDER — DICLOFENAC SODIUM 75 MG PO TBEC: 75.0000 mg | DELAYED_RELEASE_TABLET | Freq: Two times a day (BID) | ORAL | 0 refills | Status: DC

## 2016-10-08 NOTE — Patient Instructions (Signed)
Bursitis Introduction Bursitis is when the fluid-filled sac (bursa) that covers and protects a joint is swollen (inflamed). Bursitis is most common near joints, especially the knees, elbows, hips, and shoulders. Follow these instructions at home:  Take medicines only as told by your doctor.  If you were prescribed an antibiotic medicine, finish it all even if you start to feel better.  Rest the affected area as told by your doctor.  Keep the area raised up.  Avoid doing things that make the pain worse.  Apply ice to the injured area:  Place ice in a plastic bag.  Place a towel between your skin and the bag.  Leave the ice on for 20 minutes, 2-3 times a day.  Use splints, braces, pads, or walking aids as told by your doctor.  Keep all follow-up visits as told by your doctor. This is important. Contact a doctor if:  You have more pain with home care.  You have a fever.  You have chills. This information is not intended to replace advice given to you by your health care provider. Make sure you discuss any questions you have with your health care provider. Document Released: 01/28/2010 Document Revised: 01/16/2016 Document Reviewed: 10/30/2013  2017 [employer]vier  

## 2016-10-08 NOTE — Progress Notes (Signed)
Name: Carlos Conley   MRN: [id number]    DOB: 1951-03-28   Date:10/08/2016       Progress Note  Subjective  Chief Complaint  Chief Complaint  Patient presents with  . Hip Pain    right hip for 3 weeks not hurting today    HPI  Right hip pain: he states that he has been shooting pool more often and has noticed that over the past few weeks his right outer hip hurts. Pain is described as a catching sensation, aching at times, worse when standing for a long time, does not wake him up at night, no redness or swelling, no rashes. No falls. He took some Tylenol otc and improved symptoms slightly.    Patient Active Problem List   Diagnosis Date Noted  . Heartburn   . Reflux esophagitis   . Special screening for malignant neoplasms, colon   . Benign neoplasm of transverse colon   . Hyperlipidemia 01/02/2016  . Essential hypertension 01/02/2016  . Hyperglycemia 01/02/2016  . ED (erectile dysfunction) of organic origin 02/16/2013  . Prostate cancer ([place]) 06/14/2012    Past Surgical History:  Procedure Laterality Date  . COLONOSCOPY WITH PROPOFOL N/A 07/28/2016   Procedure: COLONOSCOPY WITH PROPOFOL;  Surgeon: Lucilla Lame, MD;  Location: [facility] ENDOSCOPY;  Service: Endoscopy;  Laterality: N/A;  . PROSTATE SURGERY  2002  . PROSTATE SURGERY  2002    Family History  Problem Relation Age of Onset  . Cancer Father   . Hypertension Sister     Social History   Social History  . Marital status: Married    Spouse name: N/A  . Number of children: N/A  . Years of education: N/A   Occupational History  . Not on file.   Social History Main Topics  . Smoking status: Never Smoker  . Smokeless tobacco: Never Used  . Alcohol use 10.8 oz/week    18 Cans of beer per week  . Drug use: No  . Sexual activity: Yes   Other Topics Concern  . Not on file   Social History Narrative  . No narrative on file     Current Outpatient Prescriptions:  .  aspirin EC 81 MG tablet, Take 1  tablet (81 mg total) by mouth daily., Disp: 30 tablet, Rfl: 0 .  losartan-hydrochlorothiazide (HYZAAR) 100-25 MG tablet, Take 1 tablet by mouth daily., Disp: 90 tablet, Rfl: 1  Allergies  Allergen Reactions  . Lipitor [Atorvastatin] Rash  . Pravastatin Itching, Rash and Other (See Comments)    Redness from feet up to knees.  . Simvastatin      ROS  Ten systems reviewed and is negative except as mentioned in HPI   Objective  Vitals:   10/08/16 1126  BP: (!) 142/80  Pulse: 86  Resp: 16  Temp: 98.3 F (36.8 C)  SpO2: 96%  Weight: 211 lb 2 oz (95.8 kg)  Height: 5\' 8"  (1.727 m)    Body mass index is 32.1 kg/m.  Physical Exam  Constitutional: Patient appears well-developed and well-nourished. Obese  No distress.  HEENT: head atraumatic, normocephalic, pupils equal and reactive to light, e neck supple, throat within normal limits Cardiovascular: Normal rate, regular rhythm and normal heart sounds.  No murmur heard. No BLE edema. Pulmonary/Chest: Effort normal and breath sounds normal. No respiratory distress. Abdominal: Soft.  There is no tenderness. Psychiatric: Patient has a normal mood and affect. behavior is normal. Judgment and thought content normal. Muscular Skeletal: normal back exam,  normal rom, no pain during palpation, negative straight leg raise, pain on right outer hip with abduction of hip, and palpation of right trochanteric bursa  Recent Results (from the past 2160 hour(s))  Surgical pathology     Status: None   Collection Time: 07/28/16  9:58 AM  Result Value Ref Range   SURGICAL PATHOLOGY      Surgical Pathology CASE: [id number] PATIENT: Carlos Conley Surgical Pathology Report     SPECIMEN SUBMITTED: A. Colon polyps, transverse; cbx  CLINICAL HISTORY: None provided  PRE-OPERATIVE DIAGNOSIS: Z12.11 screening colonoscopy  POST-OPERATIVE DIAGNOSIS: Colon polyps     DIAGNOSIS: A. COLON POLYP, TRANSVERSE; COLD BIOPSY: - HYPERPLASTIC  POLYP. - NEGATIVE FOR DYSPLASIA AND MALIGNANCY.   GROSS DESCRIPTION:  A. Labeled: C BX transverse colon polyp  Tissue fragment(s): 1  Size: 0.2 cm  Description: pink-tan fragment  Entirely submitted in 1 cassette(s).    Final Diagnosis performed by Quay Burow, MD.  Electronically signed 07/29/2016 10:41:45AM    The electronic signature indicates that the named Attending Pathologist has evaluated the specimen  Technical component performed at [facility], [address], [place], [place] [address] Lab: [phone] Dir: Darrick Penna. Evette Doffing, MD  Professional component performed at [facility], [facility], [place], [place], [place] [address] Lab: [phone] Dir: Dellia Nims. Rubinas, MD        PHQ2/9: Depression screen [facility] 2/9 06/02/2016 03/26/2016 02/06/2016 01/02/2016 06/19/2015  Decreased Interest 0 0 0 0 0  Down, Depressed, Hopeless 0 0 0 0 0  PHQ - 2 Score 0 0 0 0 0     Fall Risk: Fall Risk  06/02/2016 03/26/2016 02/06/2016 01/02/2016 06/19/2015  Falls in the past year? No No No No No     Assessment & Plan  1. Right hip pain  Likely bursitis, discussed PT but he would like to hold off for now, we will try  nsaid's but advised to return for follow up if no improvement, take medication with food and only for one week after that prn.  The pain is superficial and not constant, it does not seem to be secondary to bone cancer, but if no improvement we will order -xray. He agrees - diclofenac (VOLTAREN) 75 MG EC tablet; Take 1 tablet (75 mg total) by mouth 2 (two) times daily. For one week, after that prn  Dispense: 60 tablet; Refill: 0  2. Prostate cancer ([place])  Follow up with Dr. Baruch Gouty is in a few weeks

## 2016-10-19 ENCOUNTER — Ambulatory Visit: Payer: PRIVATE HEALTH INSURANCE | Admitting: Radiation Oncology

## 2016-10-19 ENCOUNTER — Other Ambulatory Visit: Payer: PRIVATE HEALTH INSURANCE

## 2016-10-21 ENCOUNTER — Other Ambulatory Visit: Payer: Self-pay | Admitting: *Deleted

## 2016-10-22 ENCOUNTER — Ambulatory Visit
Admission: RE | Admit: 2016-10-22 | Discharge: 2016-10-22 | Disposition: A | Discharge status: Home / Self Care | Payer: BLUE CROSS/BLUE SHIELD | Source: Ambulatory Visit | Attending: Radiation Oncology | Admitting: Radiation Oncology

## 2016-10-22 ENCOUNTER — Other Ambulatory Visit: Payer: Self-pay | Admitting: *Deleted

## 2016-10-22 ENCOUNTER — Inpatient Hospital Stay: Payer: BLUE CROSS/BLUE SHIELD | Attending: Radiation Oncology

## 2016-10-22 ENCOUNTER — Encounter: Payer: Self-pay | Admitting: Radiation Oncology

## 2016-10-22 VITALS — BP 178/103 | HR 84 | Temp 98.0°F | Wt 213.1 lb

## 2016-10-22 DIAGNOSIS — Z923 Personal history of irradiation: Secondary | ICD-10-CM | POA: Insufficient documentation

## 2016-10-22 DIAGNOSIS — C61 Malignant neoplasm of prostate: Secondary | ICD-10-CM | POA: Insufficient documentation

## 2016-10-22 LAB — PSA: PSA: 0.06 ng/mL (ref 0.00–4.00)

## 2016-10-22 NOTE — Progress Notes (Signed)
Radiation Oncology Follow up Note  Name: Carlos Conley   Date:   10/22/2016 MRN:  [id number] DOB: [birth year]    This 66 y.o. male presents to the clinic today for 3 year follow-up for salvage radiation therapy status post radical prostatectomy in 2003.  REFERRING PROVIDER: Ashok Norris, MD  HPI: patient is a 65 year old male now out 3 years having completed salvage radiation therapy status post radical prostatectomy in 2003 for Gleason 7 (4+3) adenocarcinoma with multiple close margins. He developed biochemical failure 2013. He is seen today in routine follow-up and is doing well. He specifically denies diarrhea dysuria or any other GI/GU complaints. His most recent PSA in June was 0.1 we have repeated that today..  COMPLICATIONS OF TREATMENT: none  FOLLOW UP COMPLIANCE: keeps appointments   PHYSICAL EXAM:  BP (!) 178/103   Pulse 84   Temp 98 F (36.7 C)   Wt 213 lb 1.2 oz (96.6 kg)   BMI 32.40 kg/m  On rectal exam rectal sphincter tone is good prostatic fossa is clear without evidence of nodularity or mass. Well-developed well-nourished patient in NAD. HEENT reveals PERLA, EOMI, discs not visualized.  Oral cavity is clear. No oral mucosal lesions are identified. Neck is clear without evidence of cervical or supraclavicular adenopathy. Lungs are clear to A&P. Cardiac examination is essentially unremarkable with regular rate and rhythm without murmur rub or thrill. Abdomen is benign with no organomegaly or masses noted. Motor sensory and DTR levels are equal and symmetric in the upper and lower extremities. Cranial nerves II through XII are grossly intact. Proprioception is intact. No peripheral adenopathy or edema is identified. No motor or sensory levels are noted. Crude visual fields are within normal range.  RADIOLOGY RESULTS:no current films for review  PLAN: present time patient is doing well under good biochemical control of his prostate cancer. I have repeated his PSA and  will will report that separately. Otherwise I've asked to see him back in 1 year for follow-up. Patient knows to call sooner with any concerns.  I would like to take this opportunity to thank you for allowing me to participate in the care of your patient.Armstead Peaks., MD

## 2016-12-01 ENCOUNTER — Ambulatory Visit: Payer: BLUE CROSS/BLUE SHIELD | Admitting: Family Medicine

## 2017-02-16 ENCOUNTER — Ambulatory Visit (INDEPENDENT_AMBULATORY_CARE_PROVIDER_SITE_OTHER): Payer: BLUE CROSS/BLUE SHIELD | Admitting: Family Medicine

## 2017-02-16 ENCOUNTER — Encounter: Payer: Self-pay | Admitting: Family Medicine

## 2017-02-16 VITALS — BP 138/78 | HR 86 | Temp 98.0°F | Resp 18 | Ht 68.0 in | Wt 214.6 lb

## 2017-02-16 DIAGNOSIS — I10 Essential (primary) hypertension: Secondary | ICD-10-CM | POA: Diagnosis not present

## 2017-02-16 DIAGNOSIS — E785 Hyperlipidemia, unspecified: Secondary | ICD-10-CM

## 2017-02-16 DIAGNOSIS — C61 Malignant neoplasm of prostate: Secondary | ICD-10-CM

## 2017-02-16 DIAGNOSIS — R739 Hyperglycemia, unspecified: Secondary | ICD-10-CM | POA: Diagnosis not present

## 2017-02-16 MED ORDER — LOSARTAN POTASSIUM-HCTZ 100-25 MG PO TABS: 1.0000 | ORAL_TABLET | Freq: Every day | ORAL | 1 refills | Status: DC

## 2017-02-16 NOTE — Progress Notes (Addendum)
Name: Carlos Conley   MRN: [id number]    DOB: [birth year]   Date:02/16/2017       Progress Note  Subjective  Chief Complaint  Chief Complaint  Patient presents with  . Hypertension    medication refills    HPI  HTN: He is currently only taking Hyzaar hctz 100/25 mg daily and bp is just abobve goal at 142/84. He is doing well, no side effect. Says BP's at home is usually run 130's/80's. No chest pain or palpitation, vision changes, headaches, or swelling. Denies decreased in exercise tolerance  Prostate Cancer: sees Dr. Baruch Gouty, last PSA was 0.06 on 10/22/2016, he is seeing him once a year and is comfortable with this plan of care. No nocturia, no decreased force of stream, no frequency or hesitancy.  Hyperlipidemia: He is no longer taking crestor, last LDL was 105, HDL 56, Advised to follow a health life style. We will check these levels when patient returns fasting for lab work. TAkes 81mg  ASA daily.  Hyperglycemia: last hgbA1C was normal at 5.5%, no polyphagia, polyuria or polydipsia. We will recheck with fasting labs.    Patient Active Problem List   Diagnosis Date Noted  . Heartburn   . Reflux esophagitis   . Special screening for malignant neoplasms, colon   . Benign neoplasm of transverse colon   . Hyperlipidemia 01/02/2016  . Essential hypertension 01/02/2016  . Hyperglycemia 01/02/2016  . ED (erectile dysfunction) of organic origin 02/16/2013  . Prostate cancer ([place]) 06/14/2012    Past Surgical History:  Procedure Laterality Date  . COLONOSCOPY WITH PROPOFOL N/A 07/28/2016   Procedure: COLONOSCOPY WITH PROPOFOL;  Surgeon: Lucilla Lame, MD;  Location: [facility] ENDOSCOPY;  Service: Endoscopy;  Laterality: N/A;  . PROSTATE SURGERY  2002  . PROSTATE SURGERY  2002    Family History  Problem Relation Age of Onset  . Cancer Father   . Hypertension Sister     Social History   Social History  . Marital status: Married    Spouse name: N/A  . Number of children: N/A   . Years of education: N/A   Occupational History  . Not on file.   Social History Main Topics  . Smoking status: Never Smoker  . Smokeless tobacco: Never Used  . Alcohol use 10.8 oz/week    18 Cans of beer per week  . Drug use: No  . Sexual activity: Yes   Other Topics Concern  . Not on file   Social History Narrative  . No narrative on file     Current Outpatient Prescriptions:  .  aspirin EC 81 MG tablet, Take 1 tablet (81 mg total) by mouth daily., Disp: 30 tablet, Rfl: 0 .  losartan-hydrochlorothiazide (HYZAAR) 100-25 MG tablet, Take 1 tablet by mouth daily., Disp: 90 tablet, Rfl: 1  Allergies  Allergen Reactions  . Lipitor [Atorvastatin] Rash  . Pravastatin Itching, Rash and Other (See Comments)    Redness from feet up to knees.  . Simvastatin      ROS  Constitutional: Negative for fever or weight change.  Respiratory: Negative for cough and shortness of breath.   Cardiovascular: Negative for chest pain or palpitations.  Gastrointestinal: Negative for abdominal pain, no bowel changes.  Musculoskeletal: Negative for gait problem or joint swelling.  Skin: Negative for rash.  Neurological: Negative for dizziness or headache.  No other specific complaints in a complete review of systems (except as listed in HPI above).  Objective  Vitals:   02/16/17 4332  02/16/17 0849  BP: (!) 142/84 138/78  Pulse: 86   Resp: 18   Temp: 98 F (36.7 C)   TempSrc: Oral   SpO2: 96%   Weight: 214 lb 9.6 oz (97.3 kg)   Height: 5\' 8"  (1.727 m)     Body mass index is 32.63 kg/m.  Physical Exam Constitutional: Patient appears well-developed and well-nourished. Obese No distress.  HEENT: head atraumatic, normocephalic, pupils equal and reactive to light, neck supple, throat within normal limits Cardiovascular: Normal rate, regular rhythm and normal heart sounds.  No murmur heard. No BLE edema. Pulmonary/Chest: Effort normal and breath sounds normal. No respiratory  distress. Psychiatric: Patient has a normal mood and affect. behavior is normal. Judgment and thought content normal.  No results found for this or any previous visit (from the past 2160 hour(s)).  PHQ2/9: Depression screen [facility] 2/9 06/02/2016 03/26/2016 02/06/2016 01/02/2016 06/19/2015  Decreased Interest 0 0 0 0 0  Down, Depressed, Hopeless 0 0 0 0 0  PHQ - 2 Score 0 0 0 0 0   Fall Risk: Fall Risk  06/02/2016 03/26/2016 02/06/2016 01/02/2016 06/19/2015  Falls in the past year? No No No No No   Assessment & Plan  1. Essential hypertension - losartan-hydrochlorothiazide (HYZAAR) 100-25 MG tablet; Take 1 tablet by mouth daily.  Dispense: 90 tablet; Refill: 1 - COMPLETE METABOLIC PANEL WITH GFR  2. Hyperglycemia - Hemoglobin A1c  3. Hyperlipidemia, unspecified hyperlipidemia type - Lipid panel   4. Prostate cancer ([place]) Stable  I have reviewed this encounter including the documentation in this note and/or discussed this patient with the Johney Maine, FNP, NP-C. I am certifying that I agree with the content of this note as supervising physician.  Steele Sizer, MD [facility] 02/16/2017, 2:47 PM

## 2017-02-23 ENCOUNTER — Telehealth: Payer: Self-pay | Admitting: Family Medicine

## 2017-02-23 NOTE — Telephone Encounter (Signed)
Please call patient and remind him that he needs to come in for fasting labs when possible. Thank you!

## 2017-02-23 NOTE — Telephone Encounter (Signed)
Patient notified

## 2017-02-27 LAB — COMPLETE METABOLIC PANEL WITH GFR
ALT: 23 U/L (ref 9–46)
AST: 39 U/L — AB (ref 10–35)
Albumin: 3.9 g/dL (ref 3.6–5.1)
Alkaline Phosphatase: 74 U/L (ref 40–115)
BUN: 4 mg/dL — AB (ref 7–25)
CHLORIDE: 102 mmol/L (ref 98–110)
CO2: 27 mmol/L (ref 20–31)
CREATININE: 1.12 mg/dL (ref 0.70–1.25)
Calcium: 9.2 mg/dL (ref 8.6–10.3)
GFR, Est African American: 80 mL/min (ref >=60)
GFR, Est Non African American: 69 mL/min (ref >=60)
Glucose, Bld: 101 mg/dL — ABNORMAL HIGH (ref 65–99)
Potassium: 5.1 mmol/L (ref 3.5–5.3)
SODIUM: 139 mmol/L (ref 135–146)
Total Bilirubin: 0.3 mg/dL (ref 0.2–1.2)
Total Protein: 6.5 g/dL (ref 6.1–8.1)

## 2017-02-27 LAB — LIPID PANEL
CHOL/HDL RATIO: 4.2 ratio (ref <5.0)
Cholesterol: 197 mg/dL (ref <200)
HDL: 47 mg/dL (ref >40)
LDL CALC: 128 mg/dL — AB (ref <100)
Triglycerides: 112 mg/dL (ref <150)
VLDL: 22 mg/dL (ref <30)

## 2017-02-27 LAB — HEMOGLOBIN A1C
Hgb A1c MFr Bld: 5.5 % (ref <5.7)
Mean Plasma Glucose: 111 mg/dL

## 2017-03-01 ENCOUNTER — Other Ambulatory Visit: Payer: Self-pay | Admitting: Family Medicine

## 2017-03-01 DIAGNOSIS — E785 Hyperlipidemia, unspecified: Secondary | ICD-10-CM

## 2017-03-01 MED ORDER — ROSUVASTATIN CALCIUM 5 MG PO TABS: 5.0000 mg | ORAL_TABLET | Freq: Every day | ORAL | 0 refills | Status: DC

## 2017-03-01 NOTE — Progress Notes (Signed)
-   Please call patient and let him know - A1C is normal, CMP normal. -Lipid panel shows a slight increase in LDL level - He needs to re-start his Crestor, I will send this in, reassure that I am writing for generic, so the cost should be less than the brand. He has done well on this medication in the past, but if he develops a rash, he should stop the medication and call us.  He also needs to focus on eating fruits and vegetables, lean proteins including fish 2-3 times a week and a handful of tree nuts every other day (cashews, almonds, walnuts), and to exercise is he is physically able.

## 2017-03-19 ENCOUNTER — Encounter: Payer: Self-pay | Admitting: Family Medicine

## 2017-03-19 ENCOUNTER — Ambulatory Visit (INDEPENDENT_AMBULATORY_CARE_PROVIDER_SITE_OTHER): Payer: BLUE CROSS/BLUE SHIELD | Admitting: Family Medicine

## 2017-03-19 VITALS — BP 134/76 | HR 84 | Temp 98.3°F | Resp 16 | Ht 68.0 in | Wt 211.0 lb

## 2017-03-19 DIAGNOSIS — E785 Hyperlipidemia, unspecified: Secondary | ICD-10-CM | POA: Diagnosis not present

## 2017-03-19 DIAGNOSIS — M25551 Pain in right hip: Secondary | ICD-10-CM

## 2017-03-19 DIAGNOSIS — I10 Essential (primary) hypertension: Secondary | ICD-10-CM | POA: Diagnosis not present

## 2017-03-19 MED ORDER — ROSUVASTATIN CALCIUM 5 MG PO TABS: 5.0000 mg | ORAL_TABLET | Freq: Every day | ORAL | 1 refills | Status: DC

## 2017-03-19 MED ORDER — MELOXICAM 15 MG PO TABS: 15.0000 mg | ORAL_TABLET | Freq: Every day | ORAL | 0 refills | Status: DC

## 2017-03-19 NOTE — Progress Notes (Signed)
Name: Carlos Conley   MRN: [id number]    DOB: [birth year]   Date:03/19/2017       Progress Note  Subjective  Chief Complaint  Chief Complaint  Patient presents with  . Hip Pain    Onset-2 years, right hip. It has bothered him since he worked but since retiring he has noticed it has gradually worsen.     HPI  Right hip pain:he was seen back Feb 2018 with complains of hip pain, today he states symptoms started about 2 years, prior to retirement, but has been worse over the past 6 months. He states symptoms started to be more noticeable over the past 6 months, he told me that initially was after increased in frequency of shooting pool, but today he states not sure of the trigger. Pain is described as a catching sensation, aching at times, worse when standing for a long time, last night felt like right hip was giving out and woke up with pain in the middle the night, no redness or swelling, no rashes. No falls, but he has developed an antalgic gait. Pain is now radiating down to anterior thigh. No tingling, numbness or back pain   HTN: taking medication, bp is at goal, not chest pain or palpitation  Hyperlipidemia: LDL was going up, back on statin, Crestor and no side effects at this time  Patient Active Problem List   Diagnosis Date Noted  . Heartburn   . Reflux esophagitis   . Special screening for malignant neoplasms, colon   . Benign neoplasm of transverse colon   . Hyperlipidemia 01/02/2016  . Essential hypertension 01/02/2016  . Hyperglycemia 01/02/2016  . ED (erectile dysfunction) of organic origin 02/16/2013  . Prostate cancer ([place]) 06/14/2012    Past Surgical History:  Procedure Laterality Date  . COLONOSCOPY WITH PROPOFOL N/A 07/28/2016   Procedure: COLONOSCOPY WITH PROPOFOL;  Surgeon: Lucilla Lame, MD;  Location: [facility] ENDOSCOPY;  Service: Endoscopy;  Laterality: N/A;  . PROSTATE SURGERY  2002  . PROSTATE SURGERY  2002    Family History  Problem Relation Age of Onset   . Cancer Father   . Hypertension Sister     Social History   Social History  . Marital status: Married    Spouse name: N/A  . Number of children: N/A  . Years of education: N/A   Occupational History  . Not on file.   Social History Main Topics  . Smoking status: Never Smoker  . Smokeless tobacco: Never Used  . Alcohol use 10.8 oz/week    18 Cans of beer per week  . Drug use: No  . Sexual activity: Yes    Partners: Female   Other Topics Concern  . Not on file   Social History Narrative  . No narrative on file     Current Outpatient Prescriptions:  .  aspirin EC 81 MG tablet, Take 1 tablet (81 mg total) by mouth daily., Disp: 30 tablet, Rfl: 0 .  losartan-hydrochlorothiazide (HYZAAR) 100-25 MG tablet, Take 1 tablet by mouth daily., Disp: 90 tablet, Rfl: 1 .  rosuvastatin (CRESTOR) 5 MG tablet, Take 1 tablet (5 mg total) by mouth daily., Disp: 60 tablet, Rfl: 0 .  meloxicam (MOBIC) 15 MG tablet, Take 1 tablet (15 mg total) by mouth daily., Disp: 30 tablet, Rfl: 0  Allergies  Allergen Reactions  . Lipitor [Atorvastatin] Rash  . Pravastatin Itching, Rash and Other (See Comments)    Redness from feet up to knees.  . Simvastatin  ROS  Constitutional: Negative for fever, positive for mild  weight change.  Respiratory: Negative for cough and shortness of breath.   Cardiovascular: Negative for chest pain or palpitations.  Gastrointestinal: Negative for abdominal pain, no bowel changes.  Musculoskeletal: Positive  for gait problem no  joint swelling.  Skin: Negative for rash.  Neurological: Negative for dizziness or headache.  No other specific complaints in a complete review of systems (except as listed in HPI above).   Objective  Vitals:   03/19/17 0922  BP: 134/76  Pulse: 84  Resp: 16  Temp: 98.3 F (36.8 C)  TempSrc: Oral  SpO2: 96%  Weight: 211 lb (95.7 kg)  Height: 5\' 8"  (1.727 m)    Body mass index is 32.08 kg/m.  Physical  Exam  Constitutional: Patient appears well-developed and well-nourished. Obese  No distress.  HEENT: head atraumatic, normocephalic, pupils equal and reactive to light,  neck supple, throat within normal limits Cardiovascular: Normal rate, regular rhythm and normal heart sounds.  No murmur heard. No BLE edema. Pulmonary/Chest: Effort normal and breath sounds normal. No respiratory distress. Abdominal: Soft.  There is no tenderness. Psychiatric: Patient has a normal mood and affect. behavior is normal. Judgment and thought content normal. Muscular skeletal: antalgic gait, difficulty bearing weight on right leg, pain with palpation superior and posteriorly to trochanteric bursa, he has pain with internal and external rotation of hip, pain is on outer hip, not groin. Normal rom of lumbar spine , but causes some tightness on right outer hip   Recent Results (from the past 2160 hour(s))  COMPLETE METABOLIC PANEL WITH GFR     Status: Abnormal   Collection Time: 02/26/17  8:51 AM  Result Value Ref Range   Sodium 139 135 - 146 mmol/L   Potassium 5.1 3.5 - 5.3 mmol/L   Chloride 102 98 - 110 mmol/L   CO2 27 20 - 31 mmol/L   Glucose, Bld 101 (H) 65 - 99 mg/dL   BUN 4 (L) 7 - 25 mg/dL   Creat 1.12 0.70 - 1.25 mg/dL    Comment:   For patients > or = 64 years of age: The upper reference limit for Creatinine is approximately 13% higher for people identified as African-American.      Total Bilirubin 0.3 0.2 - 1.2 mg/dL   Alkaline Phosphatase 74 40 - 115 U/L   AST 39 (H) 10 - 35 U/L   ALT 23 9 - 46 U/L   Total Protein 6.5 6.1 - 8.1 g/dL   Albumin 3.9 3.6 - 5.1 g/dL   Calcium 9.2 8.6 - 10.3 mg/dL   GFR, Est African American 80 >=60 mL/min   GFR, Est Non African American 69 >=60 mL/min  Hemoglobin A1c     Status: None   Collection Time: 02/26/17  8:51 AM  Result Value Ref Range   Hgb A1c MFr Bld 5.5 <5.7 %    Comment:   For the purpose of screening for the presence of diabetes:   <5.7%        Consistent with the absence of diabetes 5.7-6.4 %   Consistent with increased risk for diabetes (prediabetes) >=6.5 %     Consistent with diabetes   This assay result is consistent with a decreased risk of diabetes.   Currently, no consensus exists regarding use of hemoglobin A1c for diagnosis of diabetes in children.   According to American Diabetes Association (ADA) guidelines, hemoglobin A1c <7.0% represents optimal control in non-pregnant diabetic patients. Different  metrics may apply to specific patient populations. Standards of Medical Care in Diabetes (ADA).      Mean Plasma Glucose 111 mg/dL  Lipid panel     Status: Abnormal   Collection Time: 02/26/17  8:51 AM  Result Value Ref Range   Cholesterol 197 <200 mg/dL   Triglycerides 112 <150 mg/dL   HDL 47 >40 mg/dL   Total CHOL/HDL Ratio 4.2 <5.0 Ratio   VLDL 22 <30 mg/dL   LDL Cholesterol 128 (H) <100 mg/dL      PHQ2/9: Depression screen [facility] 2/9 03/19/2017 06/02/2016 03/26/2016 02/06/2016 01/02/2016  Decreased Interest 0 0 0 0 0  Down, Depressed, Hopeless 0 0 0 0 0  PHQ - 2 Score 0 0 0 0 0     Fall Risk: Fall Risk  03/19/2017 06/02/2016 03/26/2016 02/06/2016 01/02/2016  Falls in the past year? No No No No No     Functional Status Survey: Is the patient deaf or have difficulty hearing?: No Does the patient have difficulty seeing, even when wearing glasses/contacts?: No Does the patient have difficulty concentrating, remembering, or making decisions?: No Does the patient have difficulty walking or climbing stairs?: No Does the patient have difficulty dressing or bathing?: No Does the patient have difficulty doing errands alone such as visiting a doctor's office or shopping?: No    Assessment & Plan  1. Right hip pain  - Ambulatory referral to Orthopedic Surgery - meloxicam (MOBIC) 15 MG tablet; Take 1 tablet (15 mg total) by mouth daily.  Dispense: 30 tablet; Refill: 0  Pain seems to be more on the gluteus medius,  may need PT, but because of history of prostate cancer, should have x-ray.    2. Essential hypertension  Explained NSAID's may raise bp and he needs to monitor at home, avoid any other forms of NsAID's such as Aleve, motrin, but can still take Tylenol   3. Hyperlipidemia, unspecified hyperlipidemia type  - rosuvastatin (CRESTOR) 5 MG tablet; Take 1 tablet (5 mg total) by mouth daily.  Dispense: 90 tablet; Refill: 1  He is doing well, no myalgias

## 2017-03-24 ENCOUNTER — Telehealth: Payer: Self-pay | Admitting: Family Medicine

## 2017-03-24 ENCOUNTER — Other Ambulatory Visit: Payer: Self-pay | Admitting: Family Medicine

## 2017-03-24 DIAGNOSIS — M25551 Pain in right hip: Secondary | ICD-10-CM

## 2017-03-24 NOTE — Telephone Encounter (Signed)
Referral has been placed. 

## 2017-03-24 NOTE — Telephone Encounter (Signed)
PT NOTIFIED AND WAS TOLD HE SHOULD BE HEARING FROM THEM  ABOUT AN APPT DATE AND TIIME AND IF HE HAS NOT WITHIN A WEEK TO LET us KNOW

## 2017-03-24 NOTE — Telephone Encounter (Signed)
PT CAME IN THE OFFICE AND SPOKE WITH DR SOWLES UP FRONT AND SHE ASKED THAT WE PLACE A MESSAGE THAT THE PATIENT WENT TO [facility] AND THEY DO NOT TAKE HIS INS. ASKED THE PATIENT IF [facility] TOOK HIS INS AND HE SAID HE THOUGHT THAT THEY DO. DR SOWLES SAID A ORDER NEEDS TO BE PLACE TO [facility] FOR HIS HIP PAIN.   PATIENT WANTS SOON AS POSSIBLE AND EARLY AS POSSIBLE APPT.

## 2017-04-15 ENCOUNTER — Other Ambulatory Visit: Payer: Self-pay | Admitting: Family Medicine

## 2017-04-15 DIAGNOSIS — M25551 Pain in right hip: Secondary | ICD-10-CM

## 2017-04-15 NOTE — Telephone Encounter (Signed)
Patient requesting refill of Meloxicam to [facility].

## 2017-04-19 ENCOUNTER — Other Ambulatory Visit: Payer: Self-pay | Admitting: Family Medicine

## 2017-04-19 DIAGNOSIS — M25551 Pain in right hip: Secondary | ICD-10-CM

## 2017-04-19 MED ORDER — ACETAMINOPHEN 500 MG PO TABS: 500.0000 mg | ORAL_TABLET | Freq: Four times a day (QID) | ORAL | 0 refills | Status: DC | PRN

## 2017-04-28 ENCOUNTER — Other Ambulatory Visit: Payer: Self-pay | Admitting: Family Medicine

## 2017-04-28 DIAGNOSIS — E785 Hyperlipidemia, unspecified: Secondary | ICD-10-CM

## 2017-04-28 NOTE — Telephone Encounter (Signed)
Patient requesting refill of Crestor and Meloxicam to [facility].

## 2017-05-12 ENCOUNTER — Encounter
Admission: RE | Admit: 2017-05-12 | Discharge: 2017-05-12 | Disposition: A | Discharge status: Home / Self Care | Payer: BLUE CROSS/BLUE SHIELD | Source: Ambulatory Visit | Attending: Orthopedic Surgery | Admitting: Orthopedic Surgery

## 2017-05-12 DIAGNOSIS — Z01810 Encounter for preprocedural cardiovascular examination: Secondary | ICD-10-CM | POA: Insufficient documentation

## 2017-05-12 DIAGNOSIS — I10 Essential (primary) hypertension: Secondary | ICD-10-CM | POA: Diagnosis not present

## 2017-05-12 DIAGNOSIS — Z01812 Encounter for preprocedural laboratory examination: Secondary | ICD-10-CM | POA: Insufficient documentation

## 2017-05-12 HISTORY — DX: Unspecified osteoarthritis, unspecified site: M19.90

## 2017-05-12 LAB — BASIC METABOLIC PANEL
Anion gap: 11 (ref 5–15)
BUN: 6 mg/dL (ref 6–20)
CHLORIDE: 97 mmol/L — AB (ref 101–111)
CO2: 24 mmol/L (ref 22–32)
Calcium: 9.3 mg/dL (ref 8.9–10.3)
Creatinine, Ser: 1.07 mg/dL (ref 0.61–1.24)
GFR calc Af Amer: >60 mL/min (ref >60)
GLUCOSE: 135 mg/dL — AB (ref 65–99)
POTASSIUM: 3.7 mmol/L (ref 3.5–5.1)
SODIUM: 132 mmol/L — AB (ref 135–145)

## 2017-05-12 LAB — URINALYSIS, COMPLETE (UACMP) WITH MICROSCOPIC
BILIRUBIN URINE: NEGATIVE
GLUCOSE, UA: NEGATIVE mg/dL
HGB URINE DIPSTICK: NEGATIVE
KETONES UR: NEGATIVE mg/dL
Leukocytes, UA: NEGATIVE
Nitrite: NEGATIVE
PH: 6 (ref 5.0–8.0)
Protein, ur: NEGATIVE mg/dL
RBC / HPF: 0 RBC/hpf (ref 0–5)
SPECIFIC GRAVITY, URINE: 1.01 (ref 1.005–1.030)

## 2017-05-12 LAB — PROTIME-INR
INR: 1.01
PROTHROMBIN TIME: 13.2 s (ref 11.4–15.2)

## 2017-05-12 LAB — CBC
HCT: 44.5 % (ref 40.0–52.0)
Hemoglobin: 15.2 g/dL (ref 13.0–18.0)
MCH: 32.8 pg (ref 26.0–34.0)
MCHC: 34.2 g/dL (ref 32.0–36.0)
MCV: 95.8 fL (ref 80.0–100.0)
PLATELETS: 234 10*3/uL (ref 150–440)
RBC: 4.64 MIL/uL (ref 4.40–5.90)
RDW: 15 % — ABNORMAL HIGH (ref 11.5–14.5)
WBC: 5.9 10*3/uL (ref 3.8–10.6)

## 2017-05-12 LAB — TYPE AND SCREEN
ABO/RH(D): A POS
ANTIBODY SCREEN: NEGATIVE

## 2017-05-12 LAB — SURGICAL PCR SCREEN
MRSA, PCR: NEGATIVE
STAPHYLOCOCCUS AUREUS: NEGATIVE

## 2017-05-12 LAB — APTT: aPTT: 28 seconds (ref 24–36)

## 2017-05-12 LAB — SEDIMENTATION RATE: SED RATE: 9 mm/h (ref 0–20)

## 2017-05-12 NOTE — Patient Instructions (Signed)
Your procedure is scheduled on: May 27, 2017 (THURSDAY ) Report to Same Day Surgery 2nd floor medical mall ([facility]-take elevator on left to 2nd floor.  Check in with surgery information desk.) To find out your arrival time please call [phone] between 1PM - 3PM on May 26, 2017 [facility] )    Remember: Instructions that are not followed completely may result in serious medical risk, up to and including death, or upon the discretion of your surgeon and anesthesiologist your surgery may need to be rescheduled.    _x___ 1. Do not eat food after midnight the night before your procedure. You may drink clear liquids up to 2 hours before you are scheduled to arrive at the hospital for your procedure.  Do not drink clear liquids within 2 hours of your scheduled arrival to the hospital.  Clear liquids include  --Water or Apple juice without pulp  --Clear carbohydrate beverage such as ClearFast or Gatorade  --Black Coffee or Clear Tea (No milk, no creamers, do not add anything to                  the coffee or Tea Type 1 and type 2 diabetics should only drink water.  No gum chewing or hard candies.     __x__ 2. No Alcohol for 24 hours before or after surgery.   __x__3. No Smoking for 24 prior to surgery.   ____  4. Bring all medications with you on the day of surgery if instructed.    __x__ 5. Notify your doctor if there is any change in your medical condition     (cold, fever, infections).     Do not wear jewelry, make-up, hairpins, clips or nail polish.  Do not wear lotions, powders, or perfumes.   Do not shave 48 hours prior to surgery. Men may shave face and neck.  Do not bring valuables to the hospital.    [facility] is not responsible for any belongings or valuables.               Contacts, dentures or bridgework may not be worn into surgery.  Leave your suitcase in the car. After surgery it may be brought to your room.  For patients admitted to the  hospital, discharge time is determined by your  treatment team                      Patients discharged the day of surgery will not be allowed to drive home.  You will need someone to drive you home and stay with you the night of your procedure.    Please read over the following fact sheets that you were given:   [facility] Preparing for Surgery and or MRSA Information   ____ Take anti-hypertensive listed below, cardiac, seizure, asthma,     anti-reflux and psychiatric medicines. These include:  1. NO MEDICATIONS THE MORNING OF SURGERY  2.  3.  4.  5.  6.  ____Fleets enema or Magnesium Citrate as directed.   _x___ Use CHG Soap or sage wipes as directed on instruction sheet   ____ Use inhalers on the day of surgery and bring to hospital day of surgery  ____ Stop Metformin and Janumet 2 days prior to surgery.    ____ Take 1/2 of usual insulin dose the night before surgery and none on the morning surgery.     _x___ Follow recommendations from Cardiologist, Pulmonologist or PCP regarding  stopping Aspirin, Coumadin, Plavix ,Eliquis, Effient, or Pradaxa, and Pletal.(STOP ASPIRIN ONE WEEK BEFORE SURGERY )   X____Stop Anti-inflammatories such as Advil, Aleve, Ibuprofen, Motrin, Naproxen, Naprosyn, Goodies powders or aspirin products. OK TO TAKE TYLENOL.   _x___ Stop supplements until after surgery.  But may continue Vitamin D, Vitamin B, and multivitamin.          ____ Bring C-Pap to the hospital.

## 2017-05-13 LAB — URINE CULTURE: Culture: NO GROWTH

## 2017-05-26 MED ORDER — CEFAZOLIN SODIUM-DEXTROSE 2-4 GM/100ML-% IV SOLN
2.0000 g | Freq: Once | INTRAVENOUS | Status: AC
  Administered 2017-05-27: 2 g via INTRAVENOUS

## 2017-05-26 MED ORDER — TRANEXAMIC ACID 1000 MG/10ML IV SOLN
1000.0000 mg | INTRAVENOUS | Status: DC
  Filled 2017-05-26: qty 10

## 2017-05-27 ENCOUNTER — Encounter: Payer: Self-pay | Admitting: *Deleted

## 2017-05-27 ENCOUNTER — Encounter
Admission: RE | Disposition: A | Discharge status: Home Health | Payer: Self-pay | Source: Ambulatory Visit | Attending: Orthopedic Surgery

## 2017-05-27 ENCOUNTER — Inpatient Hospital Stay: Payer: BLUE CROSS/BLUE SHIELD | Admitting: Anesthesiology

## 2017-05-27 ENCOUNTER — Inpatient Hospital Stay: Payer: BLUE CROSS/BLUE SHIELD

## 2017-05-27 ENCOUNTER — Inpatient Hospital Stay
Admission: RE | Admit: 2017-05-27 | Discharge: 2017-05-29 | DRG: 470 | Disposition: A | Discharge status: Home Health | Payer: BLUE CROSS/BLUE SHIELD | Source: Ambulatory Visit | Attending: Orthopedic Surgery | Admitting: Orthopedic Surgery

## 2017-05-27 DIAGNOSIS — M25551 Pain in right hip: Secondary | ICD-10-CM | POA: Diagnosis present

## 2017-05-27 DIAGNOSIS — M1611 Unilateral primary osteoarthritis, right hip: Secondary | ICD-10-CM | POA: Diagnosis present

## 2017-05-27 DIAGNOSIS — E785 Hyperlipidemia, unspecified: Secondary | ICD-10-CM | POA: Diagnosis present

## 2017-05-27 DIAGNOSIS — I10 Essential (primary) hypertension: Secondary | ICD-10-CM | POA: Diagnosis present

## 2017-05-27 DIAGNOSIS — Z419 Encounter for procedure for purposes other than remedying health state, unspecified: Secondary | ICD-10-CM

## 2017-05-27 DIAGNOSIS — Z8546 Personal history of malignant neoplasm of prostate: Secondary | ICD-10-CM

## 2017-05-27 DIAGNOSIS — Z79899 Other long term (current) drug therapy: Secondary | ICD-10-CM

## 2017-05-27 DIAGNOSIS — G8918 Other acute postprocedural pain: Secondary | ICD-10-CM

## 2017-05-27 HISTORY — PX: TOTAL HIP ARTHROPLASTY: SHX124

## 2017-05-27 LAB — CBC
HCT: 42.8 % (ref 40.0–52.0)
HEMOGLOBIN: 14.5 g/dL (ref 13.0–18.0)
MCH: 33.4 pg (ref 26.0–34.0)
MCHC: 34 g/dL (ref 32.0–36.0)
MCV: 98.3 fL (ref 80.0–100.0)
Platelets: 225 10*3/uL (ref 150–440)
RBC: 4.35 MIL/uL — ABNORMAL LOW (ref 4.40–5.90)
RDW: 14.8 % — AB (ref 11.5–14.5)
WBC: 6.6 10*3/uL (ref 3.8–10.6)

## 2017-05-27 LAB — CREATININE, SERUM
CREATININE: 1.1 mg/dL (ref 0.61–1.24)
GFR calc Af Amer: >60 mL/min (ref >60)

## 2017-05-27 LAB — TYPE AND SCREEN
ABO/RH(D): A POS
Antibody Screen: NEGATIVE

## 2017-05-27 SURGERY — ARTHROPLASTY, HIP, TOTAL, ANTERIOR APPROACH
Anesthesia: Spinal | Site: Hip | Laterality: Right | Wound class: Clean

## 2017-05-27 MED ORDER — METOCLOPRAMIDE HCL 10 MG PO TABS
5.0000 mg | ORAL_TABLET | Freq: Three times a day (TID) | ORAL | Status: DC | PRN
  Administered 2017-05-27: 10 mg via ORAL
  Filled 2017-05-27: qty 1

## 2017-05-27 MED ORDER — ONDANSETRON HCL 4 MG/2ML IJ SOLN: 4.0000 mg | Freq: Once | INTRAMUSCULAR | Status: DC | PRN

## 2017-05-27 MED ORDER — BUPIVACAINE-EPINEPHRINE 0.25% -1:200000 IJ SOLN
INTRAMUSCULAR | Status: DC | PRN
  Administered 2017-05-27: 30 mL

## 2017-05-27 MED ORDER — METHOCARBAMOL 500 MG PO TABS
500.0000 mg | ORAL_TABLET | Freq: Four times a day (QID) | ORAL | Status: DC | PRN
  Administered 2017-05-27 – 2017-05-28 (×4): 500 mg via ORAL
  Filled 2017-05-27 (×4): qty 1

## 2017-05-27 MED ORDER — ZOLPIDEM TARTRATE 5 MG PO TABS: 5.0000 mg | ORAL_TABLET | Freq: Every evening | ORAL | Status: DC | PRN

## 2017-05-27 MED ORDER — ALUM & MAG HYDROXIDE-SIMETH 200-200-20 MG/5ML PO SUSP: 30.0000 mL | ORAL | Status: DC | PRN

## 2017-05-27 MED ORDER — LOSARTAN POTASSIUM 50 MG PO TABS
100.0000 mg | ORAL_TABLET | Freq: Every day | ORAL | Status: DC
  Administered 2017-05-28 – 2017-05-29 (×2): 100 mg via ORAL
  Filled 2017-05-27 (×2): qty 2

## 2017-05-27 MED ORDER — PHENOL 1.4 % MT LIQD
1.0000 | OROMUCOSAL | Status: DC | PRN
  Filled 2017-05-27: qty 177

## 2017-05-27 MED ORDER — PROPOFOL 500 MG/50ML IV EMUL
INTRAVENOUS | Status: DC | PRN
  Administered 2017-05-27: 50 ug/kg/min via INTRAVENOUS

## 2017-05-27 MED ORDER — ENOXAPARIN SODIUM 40 MG/0.4ML ~~LOC~~ SOLN
40.0000 mg | SUBCUTANEOUS | Status: DC
  Administered 2017-05-28 – 2017-05-29 (×2): 40 mg via SUBCUTANEOUS
  Filled 2017-05-27 (×2): qty 0.4

## 2017-05-27 MED ORDER — BUPIVACAINE HCL (PF) 0.5 % IJ SOLN
INTRAMUSCULAR | Status: DC | PRN
  Administered 2017-05-27: 3 mL

## 2017-05-27 MED ORDER — LOSARTAN POTASSIUM-HCTZ 100-25 MG PO TABS: 1.0000 | ORAL_TABLET | Freq: Every day | ORAL | Status: DC

## 2017-05-27 MED ORDER — FENTANYL CITRATE (PF) 100 MCG/2ML IJ SOLN: 25.0000 ug | INTRAMUSCULAR | Status: DC | PRN

## 2017-05-27 MED ORDER — BUPIVACAINE HCL (PF) 0.5 % IJ SOLN
INTRAMUSCULAR | Status: AC
  Filled 2017-05-27: qty 10

## 2017-05-27 MED ORDER — SODIUM CHLORIDE 0.9 % IV SOLN
INTRAVENOUS | Status: DC
  Administered 2017-05-27 – 2017-05-28 (×3): via INTRAVENOUS

## 2017-05-27 MED ORDER — MAGNESIUM HYDROXIDE 400 MG/5ML PO SUSP: 30.0000 mL | Freq: Every day | ORAL | Status: DC | PRN

## 2017-05-27 MED ORDER — HYDROCHLOROTHIAZIDE 25 MG PO TABS
25.0000 mg | ORAL_TABLET | Freq: Every day | ORAL | Status: DC
  Administered 2017-05-28 – 2017-05-29 (×2): 25 mg via ORAL
  Filled 2017-05-27 (×2): qty 1

## 2017-05-27 MED ORDER — PROPOFOL 10 MG/ML IV BOLUS
INTRAVENOUS | Status: AC
  Filled 2017-05-27: qty 20

## 2017-05-27 MED ORDER — CEFAZOLIN SODIUM-DEXTROSE 2-4 GM/100ML-% IV SOLN
2.0000 g | Freq: Four times a day (QID) | INTRAVENOUS | Status: DC
  Filled 2017-05-27 (×3): qty 100

## 2017-05-27 MED ORDER — BISACODYL 10 MG RE SUPP: 10.0000 mg | Freq: Every day | RECTAL | Status: DC | PRN

## 2017-05-27 MED ORDER — NEOMYCIN-POLYMYXIN B GU 40-200000 IR SOLN
Status: AC
  Filled 2017-05-27: qty 4

## 2017-05-27 MED ORDER — PROPOFOL 500 MG/50ML IV EMUL
INTRAVENOUS | Status: AC
  Filled 2017-05-27: qty 50

## 2017-05-27 MED ORDER — PHENYLEPHRINE HCL 10 MG/ML IJ SOLN
INTRAMUSCULAR | Status: DC | PRN
  Administered 2017-05-27: 50 ug/min via INTRAVENOUS

## 2017-05-27 MED ORDER — OXYCODONE HCL 5 MG PO TABS
5.0000 mg | ORAL_TABLET | ORAL | Status: DC | PRN
  Administered 2017-05-27 (×2): 10 mg via ORAL
  Filled 2017-05-27 (×2): qty 2

## 2017-05-27 MED ORDER — FENTANYL CITRATE (PF) 100 MCG/2ML IJ SOLN
INTRAMUSCULAR | Status: DC | PRN
  Administered 2017-05-27 (×2): 50 ug via INTRAVENOUS

## 2017-05-27 MED ORDER — DOCUSATE SODIUM 100 MG PO CAPS
100.0000 mg | ORAL_CAPSULE | Freq: Two times a day (BID) | ORAL | Status: DC
  Administered 2017-05-28 – 2017-05-29 (×3): 100 mg via ORAL
  Filled 2017-05-27 (×4): qty 1

## 2017-05-27 MED ORDER — DEXTROSE 5 % IV SOLN
2.0000 g | Freq: Four times a day (QID) | INTRAVENOUS | Status: AC
  Administered 2017-05-27 – 2017-05-28 (×3): 2 g via INTRAVENOUS
  Filled 2017-05-27 (×3): qty 2000

## 2017-05-27 MED ORDER — DIPHENHYDRAMINE HCL 12.5 MG/5ML PO ELIX: 12.5000 mg | ORAL_SOLUTION | ORAL | Status: DC | PRN

## 2017-05-27 MED ORDER — FENTANYL CITRATE (PF) 100 MCG/2ML IJ SOLN
INTRAMUSCULAR | Status: AC
  Filled 2017-05-27: qty 2

## 2017-05-27 MED ORDER — HYDROCODONE-ACETAMINOPHEN 7.5-325 MG PO TABS
1.0000 | ORAL_TABLET | Freq: Four times a day (QID) | ORAL | Status: DC | PRN
  Administered 2017-05-27 – 2017-05-28 (×4): 1 via ORAL
  Filled 2017-05-27 (×4): qty 1

## 2017-05-27 MED ORDER — GLYCOPYRROLATE 0.2 MG/ML IJ SOLN
INTRAMUSCULAR | Status: DC | PRN
  Administered 2017-05-27: 0.2 mg via INTRAVENOUS

## 2017-05-27 MED ORDER — PHENYLEPHRINE HCL 10 MG/ML IJ SOLN
INTRAMUSCULAR | Status: DC | PRN
  Administered 2017-05-27 (×2): 100 ug via INTRAVENOUS
  Administered 2017-05-27: 50 ug via INTRAVENOUS
  Administered 2017-05-27: 100 ug via INTRAVENOUS

## 2017-05-27 MED ORDER — ONDANSETRON HCL 4 MG/2ML IJ SOLN
4.0000 mg | Freq: Four times a day (QID) | INTRAMUSCULAR | Status: DC | PRN
  Administered 2017-05-27: 4 mg via INTRAVENOUS
  Filled 2017-05-27: qty 2

## 2017-05-27 MED ORDER — NEOMYCIN-POLYMYXIN B GU 40-200000 IR SOLN
Status: DC | PRN
  Administered 2017-05-27: 4 mL

## 2017-05-27 MED ORDER — TRANEXAMIC ACID 1000 MG/10ML IV SOLN
INTRAVENOUS | Status: DC | PRN
  Administered 2017-05-27: 1000 mg via INTRAVENOUS

## 2017-05-27 MED ORDER — ACETAMINOPHEN 650 MG RE SUPP: 650.0000 mg | Freq: Four times a day (QID) | RECTAL | Status: DC | PRN

## 2017-05-27 MED ORDER — METOCLOPRAMIDE HCL 5 MG/ML IJ SOLN
5.0000 mg | Freq: Three times a day (TID) | INTRAMUSCULAR | Status: DC | PRN
  Administered 2017-05-27: 10 mg via INTRAVENOUS
  Filled 2017-05-27: qty 2

## 2017-05-27 MED ORDER — PROPOFOL 10 MG/ML IV BOLUS
INTRAVENOUS | Status: DC | PRN
  Administered 2017-05-27 (×2): 20 mg via INTRAVENOUS

## 2017-05-27 MED ORDER — ONDANSETRON HCL 4 MG PO TABS: 4.0000 mg | ORAL_TABLET | Freq: Four times a day (QID) | ORAL | Status: DC | PRN

## 2017-05-27 MED ORDER — METHOCARBAMOL 1000 MG/10ML IJ SOLN
500.0000 mg | Freq: Four times a day (QID) | INTRAVENOUS | Status: DC | PRN
  Filled 2017-05-27: qty 5

## 2017-05-27 MED ORDER — MIDAZOLAM HCL 5 MG/5ML IJ SOLN
INTRAMUSCULAR | Status: DC | PRN
  Administered 2017-05-27: 2 mg via INTRAVENOUS

## 2017-05-27 MED ORDER — MENTHOL 3 MG MT LOZG
1.0000 | LOZENGE | OROMUCOSAL | Status: DC | PRN
  Filled 2017-05-27: qty 9

## 2017-05-27 MED ORDER — BUPIVACAINE-EPINEPHRINE (PF) 0.25% -1:200000 IJ SOLN
INTRAMUSCULAR | Status: AC
  Filled 2017-05-27: qty 30

## 2017-05-27 MED ORDER — LACTATED RINGERS IV SOLN
INTRAVENOUS | Status: DC | PRN
  Administered 2017-05-27 (×2): via INTRAVENOUS

## 2017-05-27 MED ORDER — CEFAZOLIN SODIUM-DEXTROSE 2-4 GM/100ML-% IV SOLN
INTRAVENOUS | Status: AC
  Filled 2017-05-27: qty 100

## 2017-05-27 MED ORDER — ACETAMINOPHEN 325 MG PO TABS
650.0000 mg | ORAL_TABLET | Freq: Four times a day (QID) | ORAL | Status: DC | PRN
  Filled 2017-05-27: qty 2

## 2017-05-27 MED ORDER — MORPHINE SULFATE (PF) 2 MG/ML IV SOLN: 2.0000 mg | INTRAVENOUS | Status: DC | PRN

## 2017-05-27 MED ORDER — ASPIRIN EC 81 MG PO TBEC
81.0000 mg | DELAYED_RELEASE_TABLET | Freq: Every day | ORAL | Status: DC
  Administered 2017-05-28 – 2017-05-29 (×2): 81 mg via ORAL
  Filled 2017-05-27 (×2): qty 1

## 2017-05-27 MED ORDER — MAGNESIUM CITRATE PO SOLN
1.0000 | Freq: Once | ORAL | Status: DC | PRN
  Filled 2017-05-27: qty 296

## 2017-05-27 MED ORDER — MIDAZOLAM HCL 2 MG/2ML IJ SOLN
INTRAMUSCULAR | Status: AC
  Filled 2017-05-27: qty 2

## 2017-05-27 SURGICAL SUPPLY — 51 items
BLADE SAW SAG 18.5X105 (BLADE) ×2 IMPLANT
BNDG COHESIVE 6X5 TAN STRL LF (GAUZE/BANDAGES/DRESSINGS) ×6 IMPLANT
CANISTER SUCT 1200ML W/VALVE (MISCELLANEOUS) ×2 IMPLANT
CAPT HIP TOTAL 3 ×1 IMPLANT
CATH TRAY METER 16FR LF (MISCELLANEOUS) ×2 IMPLANT
CHLORAPREP W/TINT 26ML (MISCELLANEOUS) ×2 IMPLANT
DRAPE C-ARM XRAY 36X54 (DRAPES) ×2 IMPLANT
DRAPE INCISE IOBAN 66X60 STRL (DRAPES) IMPLANT
DRAPE POUCH INSTRU U-SHP 10X18 (DRAPES) ×2 IMPLANT
DRAPE SHEET LG 3/4 BI-LAMINATE (DRAPES) ×6 IMPLANT
DRAPE TABLE BACK 80X90 (DRAPES) ×2 IMPLANT
DRESSING SURGICEL FIBRLLR 1X2 (HEMOSTASIS) ×2 IMPLANT
DRSG OPSITE POSTOP 4X8 (GAUZE/BANDAGES/DRESSINGS) ×4 IMPLANT
DRSG SURGICEL FIBRILLAR 1X2 (HEMOSTASIS) ×4
ELECT BLADE 6.5 EXT (BLADE) ×2 IMPLANT
ELECT REM PT RETURN 9FT ADLT (ELECTROSURGICAL) ×2
ELECTRODE REM PT RTRN 9FT ADLT (ELECTROSURGICAL) ×1 IMPLANT
EVACUATOR 1/8 PVC DRAIN (DRAIN) IMPLANT
GLOVE BIOGEL PI IND STRL 9 (GLOVE) ×1 IMPLANT
GLOVE BIOGEL PI INDICATOR 9 (GLOVE) ×1
GLOVE SURG SYN 9.0  PF PI (GLOVE) ×2
GLOVE SURG SYN 9.0 PF PI (GLOVE) ×2 IMPLANT
GOWN SRG 2XL LVL 4 RGLN SLV (GOWNS) ×1 IMPLANT
GOWN STRL NON-REIN 2XL LVL4 (GOWNS) ×2
GOWN STRL REUS W/ TWL LRG LVL3 (GOWN DISPOSABLE) ×1 IMPLANT
GOWN STRL REUS W/TWL LRG LVL3 (GOWN DISPOSABLE) ×2
HOLDER FOLEY CATH W/STRAP (MISCELLANEOUS) ×2 IMPLANT
HOOD PEEL AWAY FLYTE STAYCOOL (MISCELLANEOUS) ×2 IMPLANT
KIT PREVENA INCISION MGT 13 (CANNISTER) ×2 IMPLANT
MAT BLUE FLOOR 46X72 FLO (MISCELLANEOUS) ×2 IMPLANT
NDL SAFETY 18GX1.5 (NEEDLE) ×2 IMPLANT
NDL SPNL 18GX3.5 QUINCKE PK (NEEDLE) ×1 IMPLANT
NEEDLE SPNL 18GX3.5 QUINCKE PK (NEEDLE) ×2 IMPLANT
NS IRRIG 1000ML POUR BTL (IV SOLUTION) ×2 IMPLANT
PACK HIP COMPR (MISCELLANEOUS) ×2 IMPLANT
SOL PREP PVP 2OZ (MISCELLANEOUS) ×2
SOLUTION PREP PVP 2OZ (MISCELLANEOUS) ×1 IMPLANT
SPONGE DRAIN TRACH 4X4 STRL 2S (GAUZE/BANDAGES/DRESSINGS) ×2 IMPLANT
STAPLER SKIN PROX 35W (STAPLE) ×2 IMPLANT
STRAP SAFETY BODY (MISCELLANEOUS) ×2 IMPLANT
SUT DVC 2 QUILL PDO  T11 36X36 (SUTURE) ×1
SUT DVC 2 QUILL PDO T11 36X36 (SUTURE) ×1 IMPLANT
SUT SILK 0 (SUTURE) ×2
SUT SILK 0 30XBRD TIE 6 (SUTURE) ×1 IMPLANT
SUT V-LOC 90 ABS DVC 3-0 CL (SUTURE) ×2 IMPLANT
SUT VIC AB 1 CT1 36 (SUTURE) ×2 IMPLANT
SYR 20CC LL (SYRINGE) ×2 IMPLANT
SYR 30ML LL (SYRINGE) ×2 IMPLANT
TAPE MICROFOAM 4IN (TAPE) ×2 IMPLANT
TOWEL OR 17X26 4PK STRL BLUE (TOWEL DISPOSABLE) ×2 IMPLANT
WND VAC CANISTER 500ML (MISCELLANEOUS) ×2 IMPLANT

## 2017-05-27 NOTE — Anesthesia Procedure Notes (Signed)
Spinal  Patient location during procedure: OR Start time: 05/27/2017 7:20 AM End time: 05/27/2017 7:30 AM Staffing Anesthesiologist: Gunnar Fusi Resident/CRNA: Demetrius Charity Performed: resident/CRNA  Preanesthetic Checklist Completed: patient identified, site marked, surgical consent, pre-op evaluation, timeout performed, IV checked, risks and benefits discussed and monitors and equipment checked Spinal Block Patient position: sitting Prep: Betadine Patient monitoring: heart rate, continuous pulse ox, blood pressure and cardiac monitor Approach: midline Location: L2-3 Injection technique: single-shot Needle Needle type: Whitacre and Introducer  Needle gauge: 25 G Needle length: 9 cm Assessment Sensory level: T10 Additional Notes Negative paresthesia. Negative blood return. Positive free-flowing CSF. Expiration date of kit checked and confirmed. Patient tolerated procedure well, without complications.

## 2017-05-27 NOTE — Transfer of Care (Signed)
Immediate Anesthesia Transfer of Care Note  Patient: Carlos Conley  Procedure(s) Performed: TOTAL HIP ARTHROPLASTY ANTERIOR APPROACH (Right Hip)  Patient Location: PACU  Anesthesia Type:Spinal  Level of Consciousness: awake, alert  and oriented  Airway & Oxygen Therapy: Patient Spontanous Breathing and Patient connected to face mask oxygen  Post-op Assessment: Report given to RN and Post -op Vital signs reviewed and stable  Post vital signs: Reviewed and stable  Last Vitals:  Vitals:   05/27/17 0607 05/27/17 0915  BP: (!) 155/91 (!) (P) 103/56  Pulse: 75 (P) 84  Resp: 18 (P) 14  Temp: 36.5 C (P) 36.6 C  SpO2: 97% (P) 100%    Last Pain:  Vitals:   05/27/17 0607  TempSrc: Oral         Complications: No apparent anesthesia complications

## 2017-05-27 NOTE — Anesthesia Post-op Follow-up Note (Signed)
Anesthesia QCDR form completed.        

## 2017-05-27 NOTE — Anesthesia Preprocedure Evaluation (Signed)
Anesthesia Evaluation  Patient identified by MRN, date of birth, ID band Patient awake    Reviewed: Allergy & Precautions, NPO status , Patient's Chart, lab work & pertinent test results  History of Anesthesia Complications Negative for: history of anesthetic complications  Airway Mallampati: II       Dental  (+) Lower Dentures   Pulmonary neg sleep apnea, neg COPD,           Cardiovascular hypertension, Pt. on medications (-) Past MI and (-) CHF (-) dysrhythmias (-) Valvular Problems/Murmurs     Neuro/Psych neg Seizures    GI/Hepatic Neg liver ROS, neg GERD  ,  Endo/Other  neg diabetes  Renal/GU negative Renal ROS     Musculoskeletal   Abdominal   Peds  Hematology   Anesthesia Other Findings   Reproductive/Obstetrics                             Anesthesia Physical Anesthesia Plan  ASA: II  Anesthesia Plan: Spinal   Post-op Pain Management:    Induction:   PONV Risk Score and Plan:   Airway Management Planned:   Additional Equipment:   Intra-op Plan:   Post-operative Plan:   Informed Consent: I have reviewed the patients History and Physical, chart, labs and discussed the procedure including the risks, benefits and alternatives for the proposed anesthesia with the patient or authorized representative who has indicated his/her understanding and acceptance.     Plan Discussed with:   Anesthesia Plan Comments:         Anesthesia Quick Evaluation

## 2017-05-27 NOTE — Evaluation (Signed)
Physical Therapy Evaluation Patient Details Name: Carlos Conley MRN: [id number] DOB: 08/10/1951 Today's Date: 05/27/2017   History of Present Illness  admitted for acute hospitalization status post R THR (05/27/17), WBAT, anterior THPs.  Clinical Impression  Upon evaluation, patient alert and oriented; follows all commands and demonstrates good effort with all functional activities.  R LE strength grossly 3-/5, limited by post-op pain/soreness (4/10); sensation fully intact.  Currently requiring min/mod assist for bed mobility; min assist for sit/stand, basic transfers and gait (5') with RW; decreased stance time, weight acceptance, but no overt buckling or LOB noted.  Anticipate consistent progression towards all mobility goals as pain controlled. Would benefit from skilled PT to address above deficits and promote optimal return to PLOF; Recommend transition to [place] upon discharge from acute hospitalization.  May improve to outpatient PT; will continue to monitor as patient progresses.     Follow Up Recommendations Home health PT    Equipment Recommendations  Rolling walker with 5" wheels    Recommendations for Other Services       Precautions / Restrictions Precautions Precautions: Fall Restrictions Weight Bearing Restrictions: Yes RLE Weight Bearing: Weight bearing as tolerated      Mobility  Bed Mobility Overal bed mobility: Needs Assistance Bed Mobility: Supine to Sit     Supine to sit: Min assist;Mod assist     General bed mobility comments: assist for R LE management, truncal elevation  Transfers Overall transfer level: Needs assistance Equipment used: Rolling walker (2 wheeled) Transfers: Sit to/from Stand Sit to Stand: Min assist         General transfer comment: cuing for hand placement, excessive weight shift to L LE with movement transition  Ambulation/Gait Ambulation/Gait assistance: Min assist Ambulation Distance (Feet): 5 Feet Assistive device:  Rolling walker (2 wheeled)       General Gait Details: step-to gait pattern with decreased stance time R LE; increased time/effort required to advance R LE. No buckling or LOB  Stairs            Wheelchair Mobility    Modified Rankin (Stroke Patients Only)       Balance Overall balance assessment: Needs assistance Sitting-balance support: No upper extremity supported;Feet supported Sitting balance-Leahy Scale: Good     Standing balance support: Bilateral upper extremity supported Standing balance-Leahy Scale: Fair                               Pertinent Vitals/Pain Pain Assessment: 0-10 Pain Score: 4  Pain Location: R hip Pain Descriptors / Indicators: Aching;Grimacing;Guarding Pain Intervention(s): Limited activity within patient's tolerance;Monitored during session;Repositioned    Home Living Family/patient expects to be discharged to:: Private residence Living Arrangements: Spouse/significant other Available Help at Discharge: Family Type of Home: House Home Access: Stairs to enter Entrance Stairs-Rails: Right Entrance Stairs-Number of Steps: 3 Home Layout: One level;Laundry or work area in [employer]: None      Prior Function Level of Independence: Independent         Comments: Indep with ADLs, household and community mobility without assist device; denies fall history.     Hand Dominance        Extremity/Trunk Assessment   Upper Extremity Assessment Upper Extremity Assessment: Overall WFL for tasks assessed    Lower Extremity Assessment Lower Extremity Assessment:  (R hip grossly 3-/5, limited by pain; otherwise, grossly WFL.  Sensation intact and [facility])       Communication  Communication: No difficulties  Cognition Arousal/Alertness: Awake/alert Behavior During Therapy: WFL for tasks assessed/performed Overall Cognitive Status: Within Functional Limits for tasks assessed                                         General Comments      Exercises Other Exercises Other Exercises: Supine R LE therex, 1x10, AROM for muscular strength/endurance with functional activities: ankle pumps, quad sets, SAQs, heel slides, hip abduct/adduct.   Assessment/Plan    PT Assessment Patient needs continued PT services  PT Problem List Decreased strength;Decreased range of motion;Decreased activity tolerance;Decreased balance;Decreased mobility;Decreased coordination;Decreased cognition;Decreased knowledge of use of DME;Decreased safety awareness;Decreased knowledge of precautions;Pain       PT Treatment Interventions DME instruction;Gait training;Functional mobility training;Therapeutic activities;Therapeutic exercise;Balance training;Patient/family education    PT Goals (Current goals can be found in the Care Plan section)  Acute Rehab PT Goals Patient Stated Goal: to return home PT Goal Formulation: With patient/family Time For Goal Achievement: 06/10/17 Potential to Achieve Goals: Good    Frequency BID   Barriers to discharge        Co-evaluation               AM-PAC PT "6 Clicks" Daily Activity  Outcome Measure Difficulty turning over in bed (including adjusting bedclothes, sheets and blankets)?: Unable Difficulty moving from lying on back to sitting on the side of the bed? : Unable Difficulty sitting down on and standing up from a chair with arms (e.g., wheelchair, bedside commode, etc,.)?: Unable Help needed moving to and from a bed to chair (including a wheelchair)?: A Little Help needed walking in hospital room?: A Little Help needed climbing 3-5 steps with a railing? : A Lot 6 Click Score: 11    End of Session Equipment Utilized During Treatment: Gait belt Activity Tolerance: Patient tolerated treatment well Patient left: in chair;with call bell/phone within reach;with chair alarm set;with family/visitor present Nurse Communication: Mobility status PT Visit  Diagnosis: Difficulty in walking, not elsewhere classified (R26.2);Muscle weakness (generalized) (M62.81);Pain Pain - Right/Left: Right Pain - part of body: Hip    Time: 2774-1287 PT Time Calculation (min) (ACUTE ONLY): 28 min   Charges:   PT Evaluation $PT Eval Low Complexity: 1 Low PT Treatments $Therapeutic Exercise: 8-22 mins   PT G Codes:   PT G-Codes **NOT FOR INPATIENT CLASS** Functional Assessment Tool Used: AM-PAC 6 Clicks Basic Mobility Functional Limitation: Mobility: Walking and moving around Mobility: Walking and Moving Around Current Status (O6767): At least 40 percent but less than 60 percent impaired, limited or restricted Mobility: Walking and Moving Around Goal Status [id number]): At least 1 percent but less than 20 percent impaired, limited or restricted    Jaeleen Inzunza H. Owens Shark, PT, DPT, NCS 05/27/17, 5:00 PM [phone]

## 2017-05-27 NOTE — Op Note (Signed)
05/27/2017  9:25 AM  PATIENT:  Carlos Conley  66 y.o. male  PRE-OPERATIVE DIAGNOSIS:  PRIMARY LOCALIZED OSTEOARTHRITISOF RIGHT HIP  POST-OPERATIVE DIAGNOSIS:  PRIMARY LOCALIZED OSTEOARTHRITISOF RIGHT HIP  PROCEDURE:  Procedure(s): TOTAL HIP ARTHROPLASTY ANTERIOR APPROACH (Right)  SURGEON: Laurene Footman, MD  ASSISTANTS: None  ANESTHESIA:   spinal  EBL:  Total I/O In: 1500 [I.V.:1500] Out: 400 [Urine:150; Blood:250]  BLOOD ADMINISTERED:none  DRAINS: none   LOCAL MEDICATIONS USED:  MARCAINE     SPECIMEN:  Source of Specimen:  Right femoral head  DISPOSITION OF SPECIMEN:  PATHOLOGY  COUNTS:  YES  TOURNIQUET:  * No tourniquets in log *  IMPLANTS: Medacta AMIS lateralized size 3 with S 28 mm metal head,Mpact DM 56 mm cup and liner  DICTATION: .Dragon Dictation   The patient was brought to the operating room and after spinal anesthesia was obtained patient was placed on the operative table with the ipsilateral foot into the Medacta attachment, contralateral leg on a well-padded table. C-arm was brought in and preop template x-ray taken. After prepping and draping in usual sterile fashion appropriate patient identification and timeout procedures were completed. Anterior approach to the hip was obtained and centered over the greater trochanter and TFL muscle. The subcutaneous tissue was incised hemostasis being achieved by electrocautery. TFL fascia was incised and the muscle retracted laterally deep retractor placed. The lateral femoral circumflex vessels were identified and ligated. The anterior capsule was exposed and a capsulotomy performed. The neck was identified and a femoral neck cut carried out with a saw. The head was removed without difficulty and showed sclerotic femoral head and acetabulum. Reaming was carried out to 56 mm and a 56 mm cup trial gave appropriate tightness to the acetabular component a 56 DM cup was impacted into position. The leg was then externally  rotated and ischiofemoral and pubofemoral releases carried out. The femur was sequentially broached to a size 3, size 3 broach and lateralized neck with S head trials were placed and the final components chosen. The 3 lateralized stem was inserted along with a metal S 28 mm head and 56 mm liner. The hip was reduced and was stable the wound was thoroughly irrigated. Fibrillar was placed along the neck and posterior capsular cut for hemostasis The deep fascia was closed using a heavy Quill after infiltration of 30 cc of quarter percent Sensorcaine with epinephrine. 3-0 v-loc used subcutaneously followed by skin staples Xeroform and honeycomb dressing  PLAN OF CARE: Admit to inpatient

## 2017-05-27 NOTE — H&P (Signed)
Reviewed paper H+P, will be scanned into chart. No changes noted.  

## 2017-05-27 NOTE — Anesthesia Procedure Notes (Addendum)
Procedure Name: MAC Performed by: Radiance Deady Pre-anesthesia Checklist: Patient identified, Emergency Drugs available, Suction available, Patient being monitored and Timeout performed Oxygen Delivery Method: Simple face mask       

## 2017-05-27 NOTE — NC FL2 (Signed)
[place] LEVEL OF CARE SCREENING TOOL     IDENTIFICATION  Patient Name: Carlos Conley Birthdate: July 25, 1951 Sex: male Admission Date (Current Location): 05/27/2017  [place] and [place] Number:  Engineering geologist and Address:  [facility], [address] [place], [place] [address]      Provider Number: [id number]  Attending Physician Name and Address:  Hessie Knows, MD  Relative Name and Phone Number:       Current Level of Care: Hospital Recommended Level of Care: [place] Prior Approval Number:    Date Approved/Denied:   PASRR Number:   [id number]  Discharge Plan: SNF    Current Diagnoses: Patient Active Problem List   Diagnosis Date Noted  . Primary localized osteoarthritis of right hip 05/27/2017  . Heartburn   . Reflux esophagitis   . Special screening for malignant neoplasms, colon   . Benign neoplasm of transverse colon   . Hyperlipidemia 01/02/2016  . Essential hypertension 01/02/2016  . Hyperglycemia 01/02/2016  . ED (erectile dysfunction) of organic origin 02/16/2013  . Prostate cancer ([place]) 06/14/2012    Orientation RESPIRATION BLADDER Height & Weight     Self, Time, Situation, Place  O2 (Nasal Cannula) Continent Weight:   Height:     BEHAVIORAL SYMPTOMS/MOOD NEUROLOGICAL BOWEL NUTRITION STATUS      Continent Diet (Clear liquid to be advanced)  AMBULATORY STATUS COMMUNICATION OF NEEDS Skin   Extensive Assist Verbally Surgical wounds (Incision (Right Hip))                       Personal Care Assistance Level of Assistance  Bathing, Feeding, Dressing Bathing Assistance: Limited assistance Feeding assistance: Independent Dressing Assistance: Limited assistance     Functional Limitations Info  Sight, Hearing, Speech Sight Info: Adequate Hearing Info: Adequate Speech Info: Adequate    SPECIAL CARE FACTORS FREQUENCY  PT (By licensed PT), OT (By licensed OT)     PT  Frequency:  (5) OT Frequency:  (5)            Contractures      Additional Factors Info  Code Status, Allergies Code Status Info:  (Full Code) Allergies Info:  (LIPITOR ATORVASTATIN, PRAVASTATIN, SIMVASTATIN )           Current Medications (05/27/2017):  This is the current hospital active medication list Current Facility-Administered Medications  Medication Dose Route Frequency Provider Last Rate Last Dose  . 0.9 %  sodium chloride infusion   Intravenous Continuous Hessie Knows, MD 100 mL/hr at 05/27/17 1045    . acetaminophen (TYLENOL) tablet 650 mg  650 mg Oral Q6H PRN Hessie Knows, MD       Or  . acetaminophen (TYLENOL) suppository 650 mg  650 mg Rectal Q6H PRN Hessie Knows, MD      . alum & mag hydroxide-simeth (MAALOX/MYLANTA) 200-200-20 MG/5ML suspension 30 mL  30 mL Oral Q4H PRN Hessie Knows, MD      . aspirin EC tablet 81 mg  81 mg Oral Daily Hessie Knows, MD      . bisacodyl (DULCOLAX) suppository 10 mg  10 mg Rectal Daily PRN Hessie Knows, MD      . ceFAZolin (ANCEF) 2 g in dextrose 5 % 100 mL IVPB  2 g Intravenous Q6H Hessie Knows, MD      . diphenhydrAMINE (BENADRYL) 12.5 MG/5ML elixir 12.5-25 mg  12.5-25 mg Oral Q4H PRN Hessie Knows, MD      . docusate  sodium (COLACE) capsule 100 mg  100 mg Oral BID Hessie Knows, MD      . Derrill Memo ON 05/28/2017] enoxaparin (LOVENOX) injection 40 mg  40 mg Subcutaneous Q24H Hessie Knows, MD      . losartan (COZAAR) tablet 100 mg  100 mg Oral Daily Hessie Knows, MD       And  . hydrochlorothiazide (HYDRODIURIL) tablet 25 mg  25 mg Oral Daily Hessie Knows, MD      . magnesium citrate solution 1 Bottle  1 Bottle Oral Once PRN Hessie Knows, MD      . magnesium hydroxide (MILK OF MAGNESIA) suspension 30 mL  30 mL Oral Daily PRN Hessie Knows, MD      . menthol-cetylpyridinium (CEPACOL) lozenge 3 mg  1 lozenge Oral PRN Hessie Knows, MD       Or  . phenol (CHLORASEPTIC) mouth spray 1 spray  1 spray Mouth/Throat PRN Hessie Knows, MD      . methocarbamol (ROBAXIN) tablet 500 mg  500 mg Oral Q6H PRN Hessie Knows, MD       Or  . methocarbamol (ROBAXIN) 500 mg in dextrose 5 % 50 mL IVPB  500 mg Intravenous Q6H PRN Hessie Knows, MD      . metoCLOPramide (REGLAN) tablet 5-10 mg  5-10 mg Oral Q8H PRN Hessie Knows, MD       Or  . metoCLOPramide (REGLAN) injection 5-10 mg  5-10 mg Intravenous Q8H PRN Hessie Knows, MD      . morphine 2 MG/ML injection 2 mg  2 mg Intravenous Q1H PRN Hessie Knows, MD      . ondansetron [facility]) tablet 4 mg  4 mg Oral Q6H PRN Hessie Knows, MD       Or  . ondansetron [facility]) injection 4 mg  4 mg Intravenous Q6H PRN Hessie Knows, MD      . oxyCODONE (Oxy IR/ROXICODONE) immediate release tablet 5-10 mg  5-10 mg Oral Q3H PRN Hessie Knows, MD   10 mg at 05/27/17 1114  . zolpidem (AMBIEN) tablet 5 mg  5 mg Oral QHS PRN,MR X 1 Hessie Knows, MD         Discharge Medications: Please see discharge summary for a list of discharge medications.  Relevant Imaging Results:  Relevant Lab Results:   Additional Information  (SSN: [id number])  Smith Mince, Student-Social Work

## 2017-05-28 ENCOUNTER — Encounter: Payer: Self-pay | Admitting: Orthopedic Surgery

## 2017-05-28 LAB — BASIC METABOLIC PANEL
Anion gap: 7 (ref 5–15)
BUN: 9 mg/dL (ref 6–20)
CO2: 27 mmol/L (ref 22–32)
CREATININE: 0.99 mg/dL (ref 0.61–1.24)
Calcium: 8.4 mg/dL — ABNORMAL LOW (ref 8.9–10.3)
Chloride: 101 mmol/L (ref 101–111)
GFR calc Af Amer: >60 mL/min (ref >60)
GLUCOSE: 115 mg/dL — AB (ref 65–99)
Potassium: 3.6 mmol/L (ref 3.5–5.1)
SODIUM: 135 mmol/L (ref 135–145)

## 2017-05-28 LAB — CBC
HCT: 35.6 % — ABNORMAL LOW (ref 40.0–52.0)
Hemoglobin: 12.2 g/dL — ABNORMAL LOW (ref 13.0–18.0)
MCH: 33.3 pg (ref 26.0–34.0)
MCHC: 34.3 g/dL (ref 32.0–36.0)
MCV: 97 fL (ref 80.0–100.0)
PLATELETS: 203 10*3/uL (ref 150–440)
RBC: 3.67 MIL/uL — ABNORMAL LOW (ref 4.40–5.90)
RDW: 14.5 % (ref 11.5–14.5)
WBC: 8.6 10*3/uL (ref 3.8–10.6)

## 2017-05-28 MED ORDER — HYDROCODONE-ACETAMINOPHEN 7.5-325 MG PO TABS
1.0000 | ORAL_TABLET | ORAL | Status: DC | PRN
  Administered 2017-05-28 (×2): 1 via ORAL
  Administered 2017-05-29: 2 via ORAL
  Administered 2017-05-29: 1 via ORAL
  Filled 2017-05-28 (×3): qty 1
  Filled 2017-05-28: qty 2

## 2017-05-28 NOTE — Progress Notes (Signed)
Physical Therapy Treatment Patient Details Name: Carlos Conley MRN: [id number] DOB: 1951-07-04 Today's Date: 05/28/2017    History of Present Illness admitted for acute hospitalization status post R THR (05/27/17), WBAT, anterior THPs.    PT Comments    Performing all functional activities/mobility with RW, cga/close sup; improved gait distance to 57' with RW.  Consistent cuing for R LE step height/length, cadence and continuous stepping pattern.  Wife present and very supportive to patient.    Follow Up Recommendations  Home health PT     Equipment Recommendations  Rolling walker with 5" wheels    Recommendations for Other Services       Precautions / Restrictions Precautions Precautions: Fall Restrictions Weight Bearing Restrictions: Yes RLE Weight Bearing: Weight bearing as tolerated    Mobility  Bed Mobility Overal bed mobility: Needs Assistance Bed Mobility: Supine to Sit     Supine to sit: Min assist     General bed mobility comments: assist for R LE management  Transfers Overall transfer level: Needs assistance Equipment used: Rolling walker (2 wheeled) Transfers: Sit to/from Stand Sit to Stand: Min guard         General transfer comment: good carry-over of hand placement, improving use of R LE  Ambulation/Gait Ambulation/Gait assistance: Min guard Ambulation Distance (Feet): 60 Feet Assistive device: Rolling walker (2 wheeled)       General Gait Details: step to progressing to step through gait pattern; R LE step height/length improving throughout distance with cuing for increased cadence/gait speed with continuous stepping pattern   Stairs            Wheelchair Mobility    Modified Rankin (Stroke Patients Only)       Balance Overall balance assessment: Needs assistance Sitting-balance support: No upper extremity supported;Feet supported Sitting balance-Leahy Scale: Good     Standing balance support: Bilateral upper  extremity supported Standing balance-Leahy Scale: Fair                              Cognition Arousal/Alertness: Awake/alert Behavior During Therapy: WFL for tasks assessed/performed Overall Cognitive Status: Within Functional Limits for tasks assessed                                        Exercises Other Exercises Other Exercises: Sit/stand x5 with RW, cga/close sup for LE strength in closed-chain position Other Exercises: Reviewed HEP from seated position (for use outside of therapy time); patient voiced understanding    General Comments        Pertinent Vitals/Pain Pain Assessment: Faces Faces Pain Scale: Hurts little more Pain Location: R hip Pain Descriptors / Indicators: Aching;Grimacing;Guarding Pain Intervention(s): Limited activity within patient's tolerance;Monitored during session;Repositioned    Home Living                      Prior Function            PT Goals (current goals can now be found in the care plan section) Acute Rehab PT Goals Patient Stated Goal: to return home PT Goal Formulation: With patient/family Time For Goal Achievement: 06/10/17 Potential to Achieve Goals: Good Progress towards PT goals: Progressing toward goals    Frequency    BID      PT Plan Current plan remains appropriate    Co-evaluation  AM-PAC PT "6 Clicks" Daily Activity  Outcome Measure  Difficulty turning over in bed (including adjusting bedclothes, sheets and blankets)?: Unable Difficulty moving from lying on back to sitting on the side of the bed? : Unable Difficulty sitting down on and standing up from a chair with arms (e.g., wheelchair, bedside commode, etc,.)?: Unable Help needed moving to and from a bed to chair (including a wheelchair)?: A Little Help needed walking in hospital room?: A Little Help needed climbing 3-5 steps with a railing? : A Lot 6 Click Score: 11    End of Session Equipment  Utilized During Treatment: Gait belt Activity Tolerance: Patient tolerated treatment well Patient left: in chair;with call bell/phone within reach;with chair alarm set;with family/visitor present Nurse Communication: Mobility status PT Visit Diagnosis: Difficulty in walking, not elsewhere classified (R26.2);Muscle weakness (generalized) (M62.81);Pain Pain - Right/Left: Right Pain - part of body: Hip     Time: 6286-3817 PT Time Calculation (min) (ACUTE ONLY): 24 min  Charges:  $Gait Training: 8-22 mins $Therapeutic Activity: 8-22 mins                    G Codes:       Tyrese Ficek H. Owens Shark, PT, DPT, NCS 05/28/17, 10:18 AM [phone]

## 2017-05-28 NOTE — Anesthesia Postprocedure Evaluation (Signed)
Anesthesia Post Note  Patient: Carlos Conley  Procedure(s) Performed: TOTAL HIP ARTHROPLASTY ANTERIOR APPROACH (Right Hip)  Patient location during evaluation: Nursing Unit Anesthesia Type: Spinal Level of consciousness: oriented and awake and alert Pain management: pain level controlled Vital Signs Assessment: post-procedure vital signs reviewed and stable Respiratory status: spontaneous breathing and respiratory function stable Cardiovascular status: stable Postop Assessment: no headache, no backache and no apparent nausea or vomiting Anesthetic complications: no     Last Vitals:  Vitals:   05/28/17 0015 05/28/17 0519  BP: 130/71 134/72  Pulse: 86 87  Resp: 20 19  Temp: 37.4 C 37.7 C  SpO2: 99% 99%    Last Pain:  Vitals:   05/28/17 0654  TempSrc:   PainSc: 0-No pain                 Silvana Newness A

## 2017-05-28 NOTE — Care Management Note (Signed)
Case Management Note  Patient Details  Name: Carlos Conley MRN: [id number] Date of Birth: 09-May-1951  Subjective/Objective: Met with patient at bedside to discuss discharge planning. Patient lives at home with his wife, [name]. He has a walker and BSC. Offered choice of home health agenices. [facility] unable to accept patients insurance. [facility] to [facility]. Patient will have a copay of $33/visit. Patient denies issues paying copay. [name]  PCP is Dr. Ancil Boozer.                     Action/Plan: [facility] for HHPT. Need to call in Lovenox prior to discharge.   Expected Discharge Date:                  Expected Discharge Plan:  [place]  In-House Referral:     Discharge planning Services  CM Consult  Post Acute Care Choice:  Home Health Choice offered to:  Patient  DME Arranged:    DME Agency:     HH Arranged:  PT [place]:  [place]  Status of Service:  In process, will continue to follow  If discussed at Long Length of Stay Meetings, dates discussed:    Additional Comments:  Jolly Mango, RN 05/28/2017, 2:30 PM

## 2017-05-28 NOTE — Progress Notes (Signed)
Physical Therapy Treatment Patient Details Name: Carlos Conley MRN: [id number] DOB: [birth year] Today's Date: 05/28/2017    History of Present Illness admitted for acute hospitalization status post R THR (05/27/17), WBAT, anterior THPs.    PT Comments    Completed gait around nursing station (220') with RW, cga; maintains continuous stepping pattern, but with slow, cautious cadence (10' walk time, 13 seconds).  Improving control/advancement of R LE, good tolerance for closed-chain activities; greatest difficulty with tasks requiring R hip flexion. Issued handout with written/pictorial descriptions of supine HEP for use upon discharge; patient voiced understanding of all information provided. Will need to complete stairs next date.    Follow Up Recommendations  Home health PT     Equipment Recommendations  Rolling walker with 5" wheels    Recommendations for Other Services       Precautions / Restrictions Precautions Precautions: Fall Restrictions Weight Bearing Restrictions: Yes RLE Weight Bearing: Weight bearing as tolerated    Mobility  Bed Mobility               General bed mobility comments: seated in recliner beginning/end of treatment session  Transfers Overall transfer level: Needs assistance Equipment used: Rolling walker (2 wheeled) Transfers: Sit to/from Stand Sit to Stand: Min guard;Supervision            Ambulation/Gait Ambulation/Gait assistance: Min guard Ambulation Distance (Feet): 220 Feet Assistive device: Rolling walker (2 wheeled)   Gait velocity: 10' walk time, 13 seconds   General Gait Details: continuous stepping pattern, but with decreased cadence/overall gait speed   Stairs            Wheelchair Mobility    Modified Rankin (Stroke Patients Only)       Balance Overall balance assessment: Needs assistance Sitting-balance support: No upper extremity supported;Feet supported Sitting balance-Leahy Scale: Good      Standing balance support: Bilateral upper extremity supported Standing balance-Leahy Scale: Fair                              Cognition Arousal/Alertness: Awake/alert Behavior During Therapy: WFL for tasks assessed/performed Overall Cognitive Status: Within Functional Limits for tasks assessed                                        Exercises Other Exercises Other Exercises: Standing LE therex, 1x10, AROM with RW, cga/close sup for muscular strength/endurance R LE in closed-chain position.    General Comments        Pertinent Vitals/Pain Pain Assessment: Faces Faces Pain Scale: Hurts even more Pain Location: R hip Pain Descriptors / Indicators: Aching;Grimacing;Guarding Pain Intervention(s): Monitored during session;Limited activity within patient's tolerance;Premedicated before session;Repositioned    Home Living                      Prior Function            PT Goals (current goals can now be found in the care plan section) Acute Rehab PT Goals Patient Stated Goal: to return home PT Goal Formulation: With patient/family Time For Goal Achievement: 06/10/17 Potential to Achieve Goals: Good Progress towards PT goals: Progressing toward goals    Frequency    BID      PT Plan Current plan remains appropriate    Co-evaluation  AM-PAC PT "6 Clicks" Daily Activity  Outcome Measure  Difficulty turning over in bed (including adjusting bedclothes, sheets and blankets)?: Unable Difficulty moving from lying on back to sitting on the side of the bed? : Unable Difficulty sitting down on and standing up from a chair with arms (e.g., wheelchair, bedside commode, etc,.)?: A Little Help needed moving to and from a bed to chair (including a wheelchair)?: A Little Help needed walking in hospital room?: A Little Help needed climbing 3-5 steps with a railing? : A Little 6 Click Score: 14    End of Session Equipment  Utilized During Treatment: Gait belt Activity Tolerance: Patient tolerated treatment well Patient left: in chair;with call bell/phone within reach;with chair alarm set;with family/visitor present Nurse Communication: Mobility status PT Visit Diagnosis: Difficulty in walking, not elsewhere classified (R26.2);Muscle weakness (generalized) (M62.81);Pain Pain - Right/Left: Right Pain - part of body: Hip     Time: 1440-1503 PT Time Calculation (min) (ACUTE ONLY): 23 min  Charges:  $Gait Training: 8-22 mins $Therapeutic Exercise: 8-22 mins                    G Codes:       Taylormarie Register H. Owens Shark, PT, DPT, NCS 05/28/17, 4:27 PM [phone]

## 2017-05-28 NOTE — Progress Notes (Signed)
   Subjective: 1 Day Post-Op Procedure(s) (LRB): TOTAL HIP ARTHROPLASTY ANTERIOR APPROACH (Right) Patient reports pain as 1 on 0-10 scale.   Patient is well, and has had no acute complaints or problems Denies any CP, SOB, ABD pain. We will continue therapy today.  Plan is to go Home after hospital stay.  Objective: Vital signs in last 24 hours: Temp:  [97.1 F (36.2 C)-99.8 F (37.7 C)] 98.9 F (37.2 C) (10/05 0745) Pulse Rate:  [45-88] 85 (10/05 0745) Resp:  [11-20] 19 (10/05 0519) BP: (103-140)/(56-83) 140/83 (10/05 0745) SpO2:  [97 %-100 %] 97 % (10/05 0745) Weight:  [93.4 kg (206 lb)] 93.4 kg (206 lb) (10/04 1047)  Intake/Output from previous day: 10/04 0701 - 10/05 0700 In: 3998.3 [P.O.:480; I.V.:3518.3] Out: 995 [Urine:745; Blood:250] Intake/Output this shift: Total I/O In: -  Out: 500 [Urine:500]   Recent Labs  05/27/17 0617 05/28/17 0333  HGB 14.5 12.2*    Recent Labs  05/27/17 0617 05/28/17 0333  WBC 6.6 8.6  RBC 4.35* 3.67*  HCT 42.8 35.6*  PLT 225 203    Recent Labs  05/27/17 0617 05/28/17 0333  NA  --  135  K  --  3.6  CL  --  101  CO2  --  27  BUN  --  9  CREATININE 1.10 0.99  GLUCOSE  --  115*  CALCIUM  --  8.4*   No results for input(s): LABPT, INR in the last 72 hours.  EXAM General - Patient is Alert, Appropriate and Oriented Extremity - Neurovascular intact Sensation intact distally Intact pulses distally Dorsiflexion/Plantar flexion intact No cellulitis present Compartment soft Dressing - dressing C/D/I and no drainage Motor Function - intact, moving foot and toes well on exam.   Past Medical History:  Diagnosis Date  . Arthritis   . Cancer [facility]    Prostate  . Hyperlipidemia   . Hypertension     Assessment/Plan:   1 Day Post-Op Procedure(s) (LRB): TOTAL HIP ARTHROPLASTY ANTERIOR APPROACH (Right) Active Problems:   Primary localized osteoarthritis of right hip  Estimated body mass index is 31.32 kg/m as  calculated from the following:   Height as of this encounter: 5\' 8"  (1.727 m).   Weight as of this encounter: 93.4 kg (206 lb). [facility] diet Up with therapy  Needs BM Recheck labs in the am CM to assist with discharge to home with HHPT  DVT Prophylaxis - Lovenox, Foot Pumps and TED hose Weight-Bearing as tolerated to right leg   T. Rachelle Hora, PA-C [place] 05/28/2017, 8:55 AM

## 2017-05-28 NOTE — Progress Notes (Signed)
Clinical Social Worker (CSW) received SNF consult. PT is recommending home health. RN case manager aware of above. Please reconsult if future social work needs arise. CSW signing off.   Seham Gardenhire, LCSW [phone] 

## 2017-05-29 LAB — BASIC METABOLIC PANEL
Anion gap: 8 (ref 5–15)
BUN: 7 mg/dL (ref 6–20)
CALCIUM: 8.6 mg/dL — AB (ref 8.9–10.3)
CO2: 27 mmol/L (ref 22–32)
CREATININE: 0.92 mg/dL (ref 0.61–1.24)
Chloride: 99 mmol/L — ABNORMAL LOW (ref 101–111)
GFR calc Af Amer: >60 mL/min (ref >60)
GLUCOSE: 117 mg/dL — AB (ref 65–99)
Potassium: 3.3 mmol/L — ABNORMAL LOW (ref 3.5–5.1)
SODIUM: 134 mmol/L — AB (ref 135–145)

## 2017-05-29 LAB — URINALYSIS, COMPLETE (UACMP) WITH MICROSCOPIC
Bacteria, UA: NONE SEEN
Bilirubin Urine: NEGATIVE
GLUCOSE, UA: NEGATIVE mg/dL
Ketones, ur: 5 mg/dL — AB
Nitrite: NEGATIVE
PH: 6 (ref 5.0–8.0)
Protein, ur: 30 mg/dL — AB
SPECIFIC GRAVITY, URINE: 1.019 (ref 1.005–1.030)

## 2017-05-29 LAB — CBC
HCT: 35.1 % — ABNORMAL LOW (ref 40.0–52.0)
Hemoglobin: 12.3 g/dL — ABNORMAL LOW (ref 13.0–18.0)
MCH: 33.7 pg (ref 26.0–34.0)
MCHC: 35.1 g/dL (ref 32.0–36.0)
MCV: 96 fL (ref 80.0–100.0)
PLATELETS: 198 10*3/uL (ref 150–440)
RBC: 3.65 MIL/uL — ABNORMAL LOW (ref 4.40–5.90)
RDW: 14.6 % — AB (ref 11.5–14.5)
WBC: 9.3 10*3/uL (ref 3.8–10.6)

## 2017-05-29 MED ORDER — HYDROCODONE-ACETAMINOPHEN 7.5-325 MG PO TABS: 1.0000 | ORAL_TABLET | ORAL | 0 refills | Status: DC | PRN

## 2017-05-29 MED ORDER — ENOXAPARIN SODIUM 40 MG/0.4ML ~~LOC~~ SOLN: 40.0000 mg | SUBCUTANEOUS | 0 refills | Status: DC

## 2017-05-29 NOTE — Progress Notes (Signed)
   Subjective: 2 Days Post-Op Procedure(s) (LRB): TOTAL HIP ARTHROPLASTY ANTERIOR APPROACH (Right) Patient reports pain as 1 on 0-10 scale.   Patient is well, and has had no acute complaints or problems Denies any CP, SOB, ABD pain. We will continue therapy today.  Plan is to go Home after hospital stay.  Objective: Vital signs in last 24 hours: Temp:  [98.9 F (37.2 C)-99.5 F (37.5 C)] 99.5 F (37.5 C) (10/05 1955) Pulse Rate:  [85-86] 86 (10/05 1955) Resp:  [18] 18 (10/05 1955) BP: (135-140)/(70-83) 135/70 (10/05 1955) SpO2:  [94 %-97 %] 94 % (10/05 1955)  Intake/Output from previous day: 10/05 0701 - 10/06 0700 In: 2226.7 [P.O.:1320; I.V.:906.7] Out: 1750 [Urine:1750] Intake/Output this shift: Total I/O In: 480 [P.O.:480] Out: 900 [Urine:900]   Recent Labs  05/27/17 0617 05/28/17 0333 05/29/17 0424  HGB 14.5 12.2* 12.3*    Recent Labs  05/28/17 0333 05/29/17 0424  WBC 8.6 9.3  RBC 3.67* 3.65*  HCT 35.6* 35.1*  PLT 203 198    Recent Labs  05/28/17 0333 05/29/17 0424  NA 135 134*  K 3.6 3.3*  CL 101 99*  CO2 27 27  BUN 9 7  CREATININE 0.99 0.92  GLUCOSE 115* 117*  CALCIUM 8.4* 8.6*   No results for input(s): LABPT, INR in the last 72 hours.  EXAM General - Patient is Alert, Appropriate and Oriented Extremity - Neurovascular intact Sensation intact distally Intact pulses distally Dorsiflexion/Plantar flexion intact No cellulitis present Compartment soft Dressing - Clean dry and intact with scant blood tinged drainage Motor Function - intact, moving foot and toes well on exam. Ambulated 220 feet.  Past Medical History:  Diagnosis Date  . Arthritis   . Cancer [facility])    Prostate  . Hyperlipidemia   . Hypertension     Assessment/Plan:   2 Days Post-Op Procedure(s) (LRB): TOTAL HIP ARTHROPLASTY ANTERIOR APPROACH (Right) Active Problems:   Primary localized osteoarthritis of right hip  Estimated body mass index is 31.32 kg/m as  calculated from the following:   Height as of this encounter: 5\' 8"  (1.727 m).   Weight as of this encounter: 93.4 kg (206 lb). [facility] diet Up with therapy  The patient had a bowel movement.  CM to assist with discharge to home with HHPT today.  DVT Prophylaxis - Lovenox, Foot Pumps and TED hose Weight-Bearing as tolerated to right leg   Reche Dixon, PA-C [place] 05/29/2017, 6:16 AM

## 2017-05-29 NOTE — Care Management Note (Signed)
Case Management Note  Patient Details  Name: SHEENA SIMONIS MRN: [id number] Date of Birth: 1950/10/29  Subjective/Objective:   Malachy Mood at [facility] now reports that [facility] can accept this patient but may be limited in the number of home visits that they can get authorized by [employer]. This referral for home health PT, RN was declined by [facility], [facility], [facility], [facility], and [facility] for one or more of the following reasons: staffing issues, do not accept insurance, home address is out of their catchment area.                  Action/Plan:   Expected Discharge Date:  05/29/17               Expected Discharge Plan:  [place]  In-House Referral:     Discharge planning Services  CM Consult  Post Acute Care Choice:  Home Health Choice offered to:  Patient  DME Arranged:    DME Agency:     HH Arranged:  PT [place]:  [place]  Status of Service:  Completed, signed off  If discussed at [place] of Stay Meetings, dates discussed:    Additional Comments:  Yi Haugan A, RN 05/29/2017, 2:41 PM

## 2017-05-29 NOTE — Progress Notes (Signed)
Physical Therapy Treatment Patient Details Name: Carlos Conley MRN: [id number] DOB: 1950-09-03 Today's Date: 05/29/2017    History of Present Illness admitted for acute hospitalization status post R THR (05/27/17), WBAT, anterior THPs.    PT Comments    Ready for session.  Continues to need min a x 1 for bed mobility skills.  Pt able to ambulate around unit and up/down 4 steps with right rail to simulate home environment with one seated rest break on mat for education.  Continues with +1 assist for safety but overall does well.  Decreased gait speed and at times hesitancy to move RLE due to pain.  Daughter in attendance for session.  Wife in room upon return and reviewed with her also.   Pt will benefit from +1 assist upon discharge to assist with mobility and transfers until movements are more fluid.  No further questions or concerns for therapy.   Follow Up Recommendations  Home health PT     Equipment Recommendations  Rolling walker with 5" wheels    Recommendations for Other Services       Precautions / Restrictions Precautions Precautions: Fall Restrictions Weight Bearing Restrictions: Yes RLE Weight Bearing: Weight bearing as tolerated    Mobility  Bed Mobility Overal bed mobility: Needs Assistance Bed Mobility: Supine to Sit;Sit to Supine     Supine to sit: Min assist Sit to supine: Min assist      Transfers Overall transfer level: Needs assistance Equipment used: Rolling walker (2 wheeled) Transfers: Sit to/from Stand Sit to Stand: Min guard;Supervision         General transfer comment: good carry-over of hand placement, improving use of R LE  Ambulation/Gait Ambulation/Gait assistance: Min guard Ambulation Distance (Feet): 220 Feet Assistive device: Rolling walker (2 wheeled)     Gait velocity interpretation: Below normal speed for age/gender General Gait Details: continuous stepping pattern, but with decreased cadence/overall gait  speed   Stairs Stairs: Yes   Stair Management: One rail Right Number of Stairs: 4 General stair comments: relies heavily on rail but does well  Wheelchair Mobility    Modified Rankin (Stroke Patients Only)       Balance Overall balance assessment: Needs assistance Sitting-balance support: No upper extremity supported;Feet supported Sitting balance-Leahy Scale: Good     Standing balance support: Bilateral upper extremity supported Standing balance-Leahy Scale: Fair                              Cognition Arousal/Alertness: Awake/alert Behavior During Therapy: WFL for tasks assessed/performed Overall Cognitive Status: Within Functional Limits for tasks assessed                                        Exercises Other Exercises Other Exercises: Standing LE therex, 1x10, AROM with RW, cga/close sup for muscular strength/endurance R LE in closed-chain position. Other Exercises: reviewed HEP    General Comments        Pertinent Vitals/Pain Pain Assessment: 0-10 Pain Score: 4  Pain Location: R hip Pain Descriptors / Indicators: Aching;Grimacing;Guarding Pain Intervention(s): Limited activity within patient's tolerance;Premedicated before session    Home Living                      Prior Function            PT Goals (current goals  can now be found in the care plan section) Progress towards PT goals: Progressing toward goals    Frequency    BID      PT Plan Current plan remains appropriate    Co-evaluation              AM-PAC PT "6 Clicks" Daily Activity  Outcome Measure  Difficulty turning over in bed (including adjusting bedclothes, sheets and blankets)?: Unable Difficulty moving from lying on back to sitting on the side of the bed? : Unable Difficulty sitting down on and standing up from a chair with arms (e.g., wheelchair, bedside commode, etc,.)?: A Little Help needed moving to and from a bed to chair  (including a wheelchair)?: A Little Help needed walking in hospital room?: A Little Help needed climbing 3-5 steps with a railing? : A Little 6 Click Score: 14    End of Session Equipment Utilized During Treatment: Gait belt Activity Tolerance: Patient tolerated treatment well Patient left: in chair;with call bell/phone within reach;with chair alarm set;with family/visitor present   Pain - Right/Left: Right Pain - part of body: Hip     Time: 8916-9450 PT Time Calculation (min) (ACUTE ONLY): 23 min  Charges:  $Gait Training: 8-22 mins $Therapeutic Exercise: 8-22 mins                    G Codes:       Chesley Noon, PTA 05/29/17, 9:52 AM

## 2017-05-29 NOTE — Care Management Note (Addendum)
Case Management Note  Patient Details  Name: NISHAN OVENS MRN: [id number] Date of Birth: 1951/08/03  Subjective/Objective:     A referral was called to [place] at [facility] for HH=PT. Judson Roch will call back to advise whether [facility] can accept. [facility]  Mr Luther Parody already has a RW and a BSC. No other needs identified. Referral for home health was declined by [facility], [place], [facility] and [facility] per insurance issues with [employer]/[facility] .            Action/Plan:   Expected Discharge Date:  05/29/17               Expected Discharge Plan:  [place]  In-House Referral:     Discharge planning Services  CM Consult  Post Acute Care Choice:  Home Health Choice offered to:  Patient  DME Arranged:    DME Agency:     HH Arranged:  PT HH Agency:  Unknown as to which agency will accept insurance. Denied by [facility], [facility],   Status of Service:  In process, will continue to follow  If discussed at Long Length of Stay Meetings, dates discussed:    Additional Comments:  Ramin Zoll A, RN 05/29/2017, 9:29 AM

## 2017-05-29 NOTE — Discharge Summary (Signed)
Physician Discharge Summary  Subjective: 2 Days Post-Op Procedure(s) (LRB): TOTAL HIP ARTHROPLASTY ANTERIOR APPROACH (Right) Patient reports pain as mild.   Patient seen in rounds with Dr. Roland Conley Patient is well, and has had no acute complaints or problems Patient is ready to go home with home health physical therapy  Physician Discharge Summary  Patient ID: Carlos Conley MRN: [id number] DOB/AGE: 01/01/1951 66 y.o.  Admit date: 05/27/2017 Discharge date: 05/29/2017  Admission Diagnoses:  Discharge Diagnoses:  Active Problems:   Primary localized osteoarthritis of right hip   Discharged Condition: good  Hospital Course: The patient is postop day 2 from a right anterior approach total hip replacement. He is done very well since surgery. His labs have remained normal. The patient has normal vitals with a slightly elevated temperature at 99.5. The patient will have a urinalysis ordered before discharge. He has no increased pain. The patient has had a bowel movement.  Treatments: surgery:  TOTAL HIP ARTHROPLASTY ANTERIOR APPROACH (Right)  SURGEON: Carlos Footman, MD  ASSISTANTS: None  ANESTHESIA:   spinal  EBL:  Total I/O In: 1500 [I.V.:1500] Out: 400 [Urine:150; Blood:250]  BLOOD ADMINISTERED:none  DRAINS: none   LOCAL MEDICATIONS USED:  MARCAINE     SPECIMEN:  Source of Specimen:  Right femoral head  DISPOSITION OF SPECIMEN:  PATHOLOGY  COUNTS:  YES  TOURNIQUET:  * No tourniquets in log *  IMPLANTS: Medacta AMIS lateralized size 3 with S 28 mm metal head,Mpact DM 56 mm cup and liner  Discharge Exam: Blood pressure 135/70, pulse 86, temperature 99.5 F (37.5 C), temperature source Oral, resp. rate 18, height 5\' 8"  (1.727 m), weight 93.4 kg (206 lb), SpO2 94 %.   Disposition: 01-Home or Self Care   Allergies as of 05/29/2017      Reactions   Lipitor [atorvastatin] Rash   Pravastatin Itching, Rash, Other (See Comments)   Redness from feet up  to knees.   Simvastatin Itching, Rash      Medication Conley    TAKE these medications   acetaminophen 500 MG tablet Commonly known as:  TYLENOL Take 1 tablet (500 mg total) by mouth every 6 (six) hours as needed.   aspirin EC 81 MG tablet Take 1 tablet (81 mg total) by mouth daily.   enoxaparin 40 MG/0.4ML injection Commonly known as:  LOVENOX Inject 0.4 mLs (40 mg total) into the skin daily.   HYDROcodone-acetaminophen 7.5-325 MG tablet Commonly known as:  NORCO Take 1-2 tablets by mouth every 4 (four) hours as needed for moderate pain.   losartan-hydrochlorothiazide 100-25 MG tablet Commonly known as:  HYZAAR Take 1 tablet by mouth daily.      Follow-up Information    Carlos Conley, Carlos Cork, MD Follow up in 2 week(s).   Specialty:  Surgery Why:  For staple removal Contact information: Carlos Conley [place] [address] [phone]           Signed: Prescott Conley, Carlos Conley 05/29/2017, 6:22 AM   Objective: Vital signs in last 24 hours: Temp:  [98.9 F (37.2 C)-99.5 F (37.5 C)] 99.5 F (37.5 C) (10/05 1955) Pulse Rate:  [85-86] 86 (10/05 1955) Resp:  [18] 18 (10/05 1955) BP: (135-140)/(70-83) 135/70 (10/05 1955) SpO2:  [94 %-97 %] 94 % (10/05 1955)  Intake/Output from previous day:  Intake/Output Summary (Last 24 hours) at 05/29/17 0622 Last data filed at 05/29/17 0609  Gross per 24 hour  Intake          2226.67  ml  Output             1750 ml  Net           476.67 ml    Intake/Output this shift: Total I/O In: 480 [P.O.:480] Out: 900 [Urine:900]  Labs:  Recent Labs  05/27/17 0617 05/28/17 0333 05/29/17 0424  HGB 14.5 12.2* 12.3*    Recent Labs  05/28/17 0333 05/29/17 0424  WBC 8.6 9.3  RBC 3.67* 3.65*  HCT 35.6* 35.1*  PLT 203 198    Recent Labs  05/28/17 0333 05/29/17 0424  NA 135 134*  K 3.6 3.3*  CL 101 99*  CO2 27 27  BUN 9 7  CREATININE 0.99 0.92  GLUCOSE 115* 117*  CALCIUM 8.4* 8.6*   No results for  input(s): LABPT, INR in the last 72 hours.  EXAM: General - Patient is Alert and Oriented Extremity - Neurovascular intact Intact pulses distally No cellulitis present Compartment soft Incision - clean, dry, scant drainage Motor Function -  plantar flexion and dorsiflexion intact.  Assessment/Plan: 2 Days Post-Op Procedure(s) (LRB): TOTAL HIP ARTHROPLASTY ANTERIOR APPROACH (Right) Procedure(s) (LRB): TOTAL HIP ARTHROPLASTY ANTERIOR APPROACH (Right) Past Medical History:  Diagnosis Date  . Arthritis   . Cancer [facility])    Prostate  . Hyperlipidemia   . Hypertension    Active Problems:   Primary localized osteoarthritis of right hip  Estimated body mass index is 31.32 kg/m as calculated from the following:   Height as of this encounter: 5\' 8"  (1.727 m).   Weight as of this encounter: 93.4 kg (206 lb). Discharge home with home health Diet - Regular diet Follow up - in 2 weeks Activity - WBAT Disposition - Home Condition Upon Discharge - Good DVT Prophylaxis - Lovenox and TED hose  Carlos Dixon, PA-C Orthopaedic Surgery 05/29/2017, 6:22 AM

## 2017-05-29 NOTE — Discharge Instructions (Signed)
ANTERIOR APPROACH TOTAL HIP REPLACEMENT POSTOPERATIVE DIRECTIONS ° ° °Hip Rehabilitation, Guidelines Following Surgery  °The results of a hip operation are greatly improved after range of motion and muscle strengthening exercises. Follow all safety measures which are given to protect your hip. If any of these exercises cause increased pain or swelling in your joint, decrease the amount until you are comfortable again. Then slowly increase the exercises. Call your caregiver if you have problems or questions.  ° °HOME CARE INSTRUCTIONS  °Remove items at home which could result in a fall. This includes throw rugs or furniture in walking pathways.  °· ICE to the affected hip every three hours for 30 minutes at a time and then as needed for pain and swelling.  Continue to use ice on the hip for pain and swelling from surgery. You may notice swelling that will progress down to the foot and ankle.  This is normal after surgery.  Elevate the leg when you are not up walking on it.   °· Continue to use the breathing machine which will help keep your temperature down.  It is common for your temperature to cycle up and down following surgery, especially at night when you are not up moving around and exerting yourself.  The breathing machine keeps your lungs expanded and your temperature down. °· Do not place pillow under knee, focus on keeping the knee straight while resting ° °DIET °You may resume your previous home diet once your are discharged from the hospital. ° °DRESSING / WOUND CARE / SHOWERING °Keep the surgical dressing until follow up.  The dressing is water proof, so you can shower without any extra covering.  IF THE DRESSING FALLS OFF or the wound gets wet inside, change the dressing with sterile gauze.  Please use good hand washing techniques before changing the dressing.  Do not use any lotions or creams on the incision until instructed by your surgeon.   °Keep your dressing dry with showering.  You can keep it  covered and pat dry. °Change the surgical dressing daily and reapply a dry dressing each time. ° °ACTIVITY °Walk with your walker as instructed. °Use walker as long as suggested by your caregivers. °Avoid periods of inactivity such as sitting longer than an hour when not asleep. This helps prevent blood clots.  °You may resume a sexual relationship in one month or when given the OK by your doctor.  °You may return to work once you are cleared by your doctor.  °Do not drive a car for 6 weeks or until released by you surgeon.  °Do not drive while taking narcotics. ° °WEIGHT BEARING °Weight bearing as tolerated with assist device (walker, cane, etc) as directed, use it as long as suggested by your surgeon or therapist, typically at least 4-6 weeks. ° °POSTOPERATIVE CONSTIPATION PROTOCOL °Constipation - defined medically as fewer than three stools per week and severe constipation as less than one stool per week. ° °One of the most common issues patients have following surgery is constipation.  Even if you have a regular bowel pattern at home, your normal regimen is likely to be disrupted due to multiple reasons following surgery.  Combination of anesthesia, postoperative narcotics, change in appetite and fluid intake all can affect your bowels.  In order to avoid complications following surgery, here are some recommendations in order to help you during your recovery period. ° °Colace (docusate) - Pick up an over-the-counter form of Colace or another stool softener and take twice   a day as long as you are requiring postoperative pain medications.  Take with a full glass of water daily.  If you experience loose stools or diarrhea, hold the colace until you stool forms back up.  If your symptoms do not get better within 1 week or if they get worse, check with your doctor. ° °Dulcolax (bisacodyl) - Pick up over-the-counter and take as directed by the product packaging as needed to assist with the movement of your bowels.   Take with a full glass of water.  Use this product as needed if not relieved by Colace only.  ° °MiraLax (polyethylene glycol) - Pick up over-the-counter to have on hand.  MiraLax is a solution that will increase the amount of water in your bowels to assist with bowel movements.  Take as directed and can mix with a glass of water, juice, soda, coffee, or tea.  Take if you go more than two days without a movement. °Do not use MiraLax more than once per day. Call your doctor if you are still constipated or irregular after using this medication for 7 days in a row. ° °If you continue to have problems with postoperative constipation, please contact the office for further assistance and recommendations.  If you experience "the worst abdominal pain ever" or develop nausea or vomiting, please contact the office immediatly for further recommendations for treatment. ° °ITCHING ° If you experience itching with your medications, try taking only a single pain pill, or even half a pain pill at a time.  You can also use Benadryl over the counter for itching or also to help with sleep.  ° °TED HOSE STOCKINGS °Wear the elastic stockings on both legs for three weeks following surgery during the day but you may remove then at night for sleeping. ° °MEDICATIONS °See your medication summary on the “After Visit Summary” that the nursing staff will review with you prior to discharge.  You may have some home medications which will be placed on hold until you complete the course of blood thinner medication.  It is important for you to complete the blood thinner medication as prescribed by your surgeon.  Continue your approved medications as instructed at time of discharge. ° °PRECAUTIONS °If you experience chest pain or shortness of breath - call 911 immediately for transfer to the hospital emergency department.  °If you develop a fever greater that 101 F, purulent drainage from wound, increased redness or drainage from wound, foul odor  from the wound/dressing, or calf pain - CONTACT YOUR SURGEON.   °                                                °FOLLOW-UP APPOINTMENTS °Make sure you keep all of your appointments after your operation with your surgeon and caregivers. You should call the office at the above phone number and make an appointment for approximately two weeks after the date of your surgery or on the date instructed by your surgeon outlined in the "After Visit Summary". ° °RANGE OF MOTION AND STRENGTHENING EXERCISES  °These exercises are designed to help you keep full movement of your hip joint. Follow your caregiver's or physical therapist's instructions. Perform all exercises about fifteen times, three times per day or as directed. Exercise both hips, even if you have had only one joint replacement. These exercises can be   done on a training (exercise) mat, on the floor, on a table or on a bed. Use whatever works the best and is most comfortable for you. Use music or television while you are exercising so that the exercises are a pleasant break in your day. This will make your life better with the exercises acting as a break in routine you can look forward to.  °Lying on your back, slowly slide your foot toward your buttocks, raising your knee up off the floor. Then slowly slide your foot back down until your leg is straight again.  °Lying on your back spread your legs as far apart as you can without causing discomfort.  °Lying on your side, raise your upper leg and foot straight up from the floor as far as is comfortable. Slowly lower the leg and repeat.  °Lying on your back, tighten up the muscle in the front of your thigh (quadriceps muscles). You can do this by keeping your leg straight and trying to raise your heel off the floor. This helps strengthen the largest muscle supporting your knee.  °Lying on your back, tighten up the muscles of your buttocks both with the legs straight and with the knee bent at a comfortable angle while  keeping your heel on the floor.  ° °IF YOU ARE TRANSFERRED TO A SKILLED REHAB FACILITY °If the patient is transferred to a skilled rehab facility following release from the hospital, a list of the current medications will be sent to the facility for the patient to continue.  When discharged from the skilled rehab facility, please have the facility set up the patient's Home Health Physical Therapy prior to being released. Also, the skilled facility will be responsible for providing the patient with their medications at time of release from the facility to include their pain medication, the muscle relaxants, and their blood thinner medication. If the patient is still at the rehab facility at time of the two week follow up appointment, the skilled rehab facility will also need to assist the patient in arranging follow up appointment in our office and any transportation needs. ° °MAKE SURE YOU:  °Understand these instructions.  °Get help right away if you are not doing well or get worse.  ° ° °Pick up stool softner and laxative for home use following surgery while on pain medications. °Do not submerge incision under water. °Please use good hand washing techniques while changing dressing each day. °May shower starting three days after surgery. °Please use a clean towel to pat the incision dry following showers. °Continue to use ice for pain and swelling after surgery. °Do not use any lotions or creams on the incision until instructed by your surgeon. ° °

## 2017-05-29 NOTE — Progress Notes (Signed)
Patient d/c home via family. Instructions and prescriptions given to patient, verbalized understanding. Verbalized understanding.

## 2017-05-31 LAB — SURGICAL PATHOLOGY

## 2017-06-01 ENCOUNTER — Encounter: Payer: BLUE CROSS/BLUE SHIELD | Admitting: Family Medicine

## 2017-07-09 ENCOUNTER — Ambulatory Visit (INDEPENDENT_AMBULATORY_CARE_PROVIDER_SITE_OTHER): Payer: BLUE CROSS/BLUE SHIELD

## 2017-07-09 DIAGNOSIS — Z23 Encounter for immunization: Secondary | ICD-10-CM | POA: Diagnosis not present

## 2017-08-11 ENCOUNTER — Ambulatory Visit: Payer: BLUE CROSS/BLUE SHIELD | Admitting: Family Medicine

## 2017-08-11 ENCOUNTER — Other Ambulatory Visit: Payer: Self-pay | Admitting: *Deleted

## 2017-08-11 ENCOUNTER — Encounter: Payer: Self-pay | Admitting: Family Medicine

## 2017-08-11 VITALS — BP 122/82 | HR 96 | Temp 98.9°F | Resp 14 | Wt 215.7 lb

## 2017-08-11 DIAGNOSIS — Z96641 Presence of right artificial hip joint: Secondary | ICD-10-CM | POA: Insufficient documentation

## 2017-08-11 DIAGNOSIS — J069 Acute upper respiratory infection, unspecified: Secondary | ICD-10-CM | POA: Diagnosis not present

## 2017-08-11 DIAGNOSIS — R059 Cough, unspecified: Secondary | ICD-10-CM

## 2017-08-11 DIAGNOSIS — R05 Cough: Secondary | ICD-10-CM

## 2017-08-11 MED ORDER — FLUTICASONE PROPIONATE 50 MCG/ACT NA SUSP: 2.0000 | Freq: Every day | NASAL | 6 refills | Status: DC

## 2017-08-11 MED ORDER — HYDROCOD POLST-CPM POLST ER 10-8 MG/5ML PO SUER: 5.0000 mL | Freq: Every evening | ORAL | 0 refills | Status: DC | PRN

## 2017-08-11 NOTE — Progress Notes (Signed)
Name: Carlos Conley   MRN: [id number]    DOB: Feb 15, 1951   Date:08/11/2017       Progress Note  Subjective  Chief Complaint  Chief Complaint  Patient presents with  . URI  . Cough    HPI  URI/cough:symptoms started four  days ago,  he states he noticed nasal congestion, post-nasal drainage that is worse at night, and a cough that has been keeping him up at night. Cough is usually dry but occasionally productive, white to yellow in color. No SOB , wheezing or fever. Denies headaches, change in appetite or in bowel movements. No rashes. Wife called because she is tired of his cough.   History of hip replacement: Oct 4th, doing well, started PT. Off pain medication  Patient Active Problem List   Diagnosis Date Noted  . History of total right hip replacement 08/11/2017  . Reflux esophagitis   . Benign neoplasm of transverse colon   . Hyperlipidemia 01/02/2016  . Essential hypertension 01/02/2016  . Hyperglycemia 01/02/2016  . ED (erectile dysfunction) of organic origin 02/16/2013  . Prostate cancer ([place]) 06/14/2012    Social History   Tobacco Use  . Smoking status: Never Smoker  . Smokeless tobacco: Never Used  Substance Use Topics  . Alcohol use: Yes    Alcohol/week: 1.2 - 1.8 oz    Types: 2 - 3 Cans of beer per week    Comment: daily     Current Outpatient Medications:  .  acetaminophen (TYLENOL) 500 MG tablet, Take 1 tablet (500 mg total) by mouth every 6 (six) hours as needed., Disp: 30 tablet, Rfl: 0 .  aspirin EC 81 MG tablet, Take 1 tablet (81 mg total) by mouth daily., Disp: 30 tablet, Rfl: 0 .  losartan-hydrochlorothiazide (HYZAAR) 100-25 MG tablet, Take 1 tablet by mouth daily., Disp: 90 tablet, Rfl: 1  Allergies  Allergen Reactions  . Lipitor [Atorvastatin] Rash  . Pravastatin Itching, Rash and Other (See Comments)    Redness from feet up to knees.  . Simvastatin Itching and Rash    ROS  Ten systems reviewed and is negative except as mentioned in  HPI   Objective  Vitals:   08/11/17 1014  BP: 122/82  Pulse: 96  Resp: 14  Temp: 98.9 F (37.2 C)  TempSrc: Oral  SpO2: 95%  Weight: 215 lb 11.2 oz (97.8 kg)    Body mass index is 32.8 kg/m.   Physical Exam  Constitutional: Patient appears well-developed and well-nourished. Obese  No distress.  HEENT: head atraumatic, normocephalic, pupils equal and reactive to light, ears normal TM bilaterally,  neck supple, throat within normal limits Cardiovascular: Normal rate, regular rhythm and normal heart sounds.  No murmur heard. No BLE edema. Pulmonary/Chest: Effort normal and breath sounds normal. No respiratory distress. Abdominal: Soft.  There is no tenderness. Psychiatric: Patient has a normal mood and affect. behavior is normal. Judgment and thought content normal.  Recent Results (from the past 2160 hour(s))  Type and screen [place]     Status: None   Collection Time: 05/27/17  6:16 AM  Result Value Ref Range   ABO/RH(D) A POS    Antibody Screen NEG    Sample Expiration 05/30/2017   CBC     Status: Abnormal   Collection Time: 05/27/17  6:17 AM  Result Value Ref Range   WBC 6.6 3.8 - 10.6 K/uL   RBC 4.35 (L) 4.40 - 5.90 MIL/uL   Hemoglobin 14.5 13.0 -  18.0 g/dL   HCT 42.8 40.0 - 52.0 %   MCV 98.3 80.0 - 100.0 fL   MCH 33.4 26.0 - 34.0 pg   MCHC 34.0 32.0 - 36.0 g/dL   RDW 14.8 (H) 11.5 - 14.5 %   Platelets 225 150 - 440 K/uL  Creatinine, serum     Status: None   Collection Time: 05/27/17  6:17 AM  Result Value Ref Range   Creatinine, Ser 1.10 0.61 - 1.24 mg/dL   GFR calc non Af Amer >60 >60 mL/min   GFR calc Af Amer >60 >60 mL/min    Comment: (NOTE) The eGFR has been calculated using the CKD EPI equation. This calculation has not been validated in all clinical situations. eGFR's persistently <60 mL/min signify possible Chronic Kidney Disease.   Surgical pathology     Status: None   Collection Time: 05/27/17  8:11 AM  Result Value  Ref Range   SURGICAL PATHOLOGY      Surgical Pathology CASE: [id number] PATIENT: Carlos Conley Surgical Pathology Report     SPECIMEN SUBMITTED: A. Femoral head, right  CLINICAL HISTORY: None provided  PRE-OPERATIVE DIAGNOSIS: Primary localized osteoarthritis of right hip  POST-OPERATIVE DIAGNOSIS: Same as pre-op     DIAGNOSIS: A. FEMORAL HEAD, RIGHT; ARTHROPLASTY: - OSTEOARTHROSIS. - TRILINEAGE HEMATOPOIESIS. - NEGATIVE FOR MALIGNANCY.   GROSS DESCRIPTION:  A. Labeled: right femoral head  Size of specimen:      Head -4.6 x 4.8 cm      Neck -3.7 x 3.0 Cm  Articular surface: slightly misshapened with nodularity and eburnation  Cut surface: yellow-brown  Other findings: none noted  Block summary: 1 - representative section(s), post decalcification    Final Diagnosis performed by Quay Burow, MD.  Electronically signed 05/31/2017 10:18:52AM    The electronic signature indicates that the named Attending Pathologist has evaluated the specimen  Technical component p erformed at [facility], [address], [place], [place] [address] Lab: [phone] Dir: Darrick Penna. Evette Doffing, MD  Professional component performed at [facility], [facility], [place], [place], [place] [address] Lab: [phone] Dir: Dellia Nims. Rubinas, MD    CBC     Status: Abnormal   Collection Time: 05/28/17  3:33 AM  Result Value Ref Range   WBC 8.6 3.8 - 10.6 K/uL   RBC 3.67 (L) 4.40 - 5.90 MIL/uL   Hemoglobin 12.2 (L) 13.0 - 18.0 g/dL   HCT 35.6 (L) 40.0 - 52.0 %   MCV 97.0 80.0 - 100.0 fL   MCH 33.3 26.0 - 34.0 pg   MCHC 34.3 32.0 - 36.0 g/dL   RDW 14.5 11.5 - 14.5 %   Platelets 203 150 - 440 K/uL  Basic metabolic panel     Status: Abnormal   Collection Time: 05/28/17  3:33 AM  Result Value Ref Range   Sodium 135 135 - 145 mmol/L   Potassium 3.6 3.5 - 5.1 mmol/L   Chloride 101 101 - 111 mmol/L   CO2 27 22 - 32 mmol/L   Glucose, Bld 115 (H) 65 - 99  mg/dL   BUN 9 6 - 20 mg/dL   Creatinine, Ser 0.99 0.61 - 1.24 mg/dL   Calcium 8.4 (L) 8.9 - 10.3 mg/dL   GFR calc non Af Amer >60 >60 mL/min   GFR calc Af Amer >60 >60 mL/min    Comment: (NOTE) The eGFR has been calculated using the CKD EPI equation. This calculation has not been validated in all clinical situations. eGFR's persistently <60  mL/min signify possible Chronic Kidney Disease.    Anion gap 7 5 - 15  CBC     Status: Abnormal   Collection Time: 05/29/17  4:24 AM  Result Value Ref Range   WBC 9.3 3.8 - 10.6 K/uL   RBC 3.65 (L) 4.40 - 5.90 MIL/uL   Hemoglobin 12.3 (L) 13.0 - 18.0 g/dL   HCT 35.1 (L) 40.0 - 52.0 %   MCV 96.0 80.0 - 100.0 fL   MCH 33.7 26.0 - 34.0 pg   MCHC 35.1 32.0 - 36.0 g/dL   RDW 14.6 (H) 11.5 - 14.5 %   Platelets 198 150 - 440 K/uL  Basic metabolic panel     Status: Abnormal   Collection Time: 05/29/17  4:24 AM  Result Value Ref Range   Sodium 134 (L) 135 - 145 mmol/L   Potassium 3.3 (L) 3.5 - 5.1 mmol/L   Chloride 99 (L) 101 - 111 mmol/L   CO2 27 22 - 32 mmol/L   Glucose, Bld 117 (H) 65 - 99 mg/dL   BUN 7 6 - 20 mg/dL   Creatinine, Ser 0.92 0.61 - 1.24 mg/dL   Calcium 8.6 (L) 8.9 - 10.3 mg/dL   GFR calc non Af Amer >60 >60 mL/min   GFR calc Af Amer >60 >60 mL/min    Comment: (NOTE) The eGFR has been calculated using the CKD EPI equation. This calculation has not been validated in all clinical situations. eGFR's persistently <60 mL/min signify possible Chronic Kidney Disease.    Anion gap 8 5 - 15  Urinalysis, Complete w Microscopic     Status: Abnormal   Collection Time: 05/29/17  6:26 AM  Result Value Ref Range   Color, Urine YELLOW (A) YELLOW   APPearance CLEAR (A) CLEAR   Specific Gravity, Urine 1.019 1.005 - 1.030   pH 6.0 5.0 - 8.0   Glucose, UA NEGATIVE NEGATIVE mg/dL   Hgb urine dipstick SMALL (A) NEGATIVE   Bilirubin Urine NEGATIVE NEGATIVE   Ketones, ur 5 (A) NEGATIVE mg/dL   Protein, ur 30 (A) NEGATIVE mg/dL   Nitrite  NEGATIVE NEGATIVE   Leukocytes, UA MODERATE (A) NEGATIVE   RBC / HPF 6-30 0 - 5 RBC/hpf   WBC, UA 6-30 0 - 5 WBC/hpf   Bacteria, UA NONE SEEN NONE SEEN   Squamous Epithelial / LPF 0-5 (A) NONE SEEN   Mucus PRESENT      Assessment & Plan  1. Cough  - fluticasone (FLONASE) 50 MCG/ACT nasal spray; Place 2 sprays into both nostrils daily.  Dispense: 16 g; Refill: 6 - chlorpheniramine-HYDROcodone (TUSSIONEX PENNKINETIC ER) 10-8 MG/5ML SUER; Take 5 mLs by mouth at bedtime as needed.  Dispense: 140 mL; Refill: 0  2. Viral upper respiratory illness  - fluticasone (FLONASE) 50 MCG/ACT nasal spray; Place 2 sprays into both nostrils daily.  Dispense: 16 g; Refill: 6  -Red flags and when to present for emergency care or RTC including fever >101.68F, chest pain, shortness of breath, new/worsening/un-resolving symptoms Follow up and care instructions discussed and provided in AVS.

## 2017-10-20 ENCOUNTER — Inpatient Hospital Stay: Payer: BLUE CROSS/BLUE SHIELD | Attending: Radiation Oncology

## 2017-10-20 DIAGNOSIS — C61 Malignant neoplasm of prostate: Secondary | ICD-10-CM | POA: Diagnosis not present

## 2017-10-20 LAB — PSA: PROSTATIC SPECIFIC ANTIGEN: 0.05 ng/mL (ref 0.00–4.00)

## 2017-10-25 ENCOUNTER — Other Ambulatory Visit: Payer: Self-pay

## 2017-10-25 ENCOUNTER — Ambulatory Visit
Admission: RE | Admit: 2017-10-25 | Discharge: 2017-10-25 | Disposition: A | Discharge status: Home / Self Care | Payer: BLUE CROSS/BLUE SHIELD | Source: Ambulatory Visit | Attending: Radiation Oncology | Admitting: Radiation Oncology

## 2017-10-25 ENCOUNTER — Encounter: Payer: Self-pay | Admitting: Radiation Oncology

## 2017-10-25 VITALS — BP 160/96 | HR 85 | Temp 96.8°F | Resp 18 | Wt 213.4 lb

## 2017-10-25 DIAGNOSIS — C61 Malignant neoplasm of prostate: Secondary | ICD-10-CM | POA: Diagnosis present

## 2017-10-25 DIAGNOSIS — Z923 Personal history of irradiation: Secondary | ICD-10-CM | POA: Insufficient documentation

## 2017-10-25 NOTE — Progress Notes (Signed)
Radiation Oncology Follow up Note  Name: Carlos Conley   Date:   10/25/2017 MRN:  [id number] DOB: [birth year]    This 67 y.o. male presents to the clinic today for for your follow-up status post salvage radiation therapy for adenocarcinoma prostate status post prostatectomy 2003.  REFERRING PROVIDER: Steele Sizer, MD  HPI: Patient is a 67 year old male now out 4 years having completed salvage radiation therapy for Gleason 7 (4+3) adenocarcinoma status post radical prostatectomy back in 2003. He developed biochemical failure 2013. He is seen today in routine follow-up and is doing well. He specifically denies diarrhea dysuria or any other other lower urinary tract symptoms. His most recent PSA performed February 27 was 0.05.Marland Kitchen  COMPLICATIONS OF TREATMENT: none  FOLLOW UP COMPLIANCE: keeps appointments   PHYSICAL EXAM:  BP (!) 160/96   Pulse 85   Temp (!) 96.8 F (36 C)   Resp 18   Wt 213 lb 6.5 oz (96.8 kg)   BMI 32.45 kg/m  Well-developed well-nourished patient in NAD. HEENT reveals PERLA, EOMI, discs not visualized.  Oral cavity is clear. No oral mucosal lesions are identified. Neck is clear without evidence of cervical or supraclavicular adenopathy. Lungs are clear to A&P. Cardiac examination is essentially unremarkable with regular rate and rhythm without murmur rub or thrill. Abdomen is benign with no organomegaly or masses noted. Motor sensory and DTR levels are equal and symmetric in the upper and lower extremities. Cranial nerves II through XII are grossly intact. Proprioception is intact. No peripheral adenopathy or edema is identified. No motor or sensory levels are noted. Crude visual fields are within normal range.  RADIOLOGY RESULTS: No current films for review  PLAN: Present time patient is under excellent biochemical chemical control of his prostate cancer. I'm please was overall progress. I've asked to see him back in 1 year for follow-up. Patient knows to call sooner  with any concerns.  I would like to take this opportunity to thank you for allowing me to participate in the care of your patient.Noreene Filbert, MD

## 2018-05-24 ENCOUNTER — Ambulatory Visit (INDEPENDENT_AMBULATORY_CARE_PROVIDER_SITE_OTHER): Payer: Medicare HMO

## 2018-05-24 DIAGNOSIS — Z23 Encounter for immunization: Secondary | ICD-10-CM

## 2018-06-02 ENCOUNTER — Encounter: Payer: Self-pay | Admitting: Family Medicine

## 2018-06-02 ENCOUNTER — Ambulatory Visit (INDEPENDENT_AMBULATORY_CARE_PROVIDER_SITE_OTHER): Payer: Medicare HMO | Admitting: Family Medicine

## 2018-06-02 VITALS — BP 165/98 | HR 88 | Temp 97.9°F | Resp 16 | Ht 66.54 in | Wt 206.2 lb

## 2018-06-02 DIAGNOSIS — Z8546 Personal history of malignant neoplasm of prostate: Secondary | ICD-10-CM | POA: Diagnosis not present

## 2018-06-02 DIAGNOSIS — I10 Essential (primary) hypertension: Secondary | ICD-10-CM | POA: Diagnosis not present

## 2018-06-02 DIAGNOSIS — R739 Hyperglycemia, unspecified: Secondary | ICD-10-CM

## 2018-06-02 DIAGNOSIS — Z0101 Encounter for examination of eyes and vision with abnormal findings: Secondary | ICD-10-CM | POA: Diagnosis not present

## 2018-06-02 DIAGNOSIS — N5231 Erectile dysfunction following radical prostatectomy: Secondary | ICD-10-CM | POA: Diagnosis not present

## 2018-06-02 DIAGNOSIS — R94120 Abnormal auditory function study: Secondary | ICD-10-CM

## 2018-06-02 DIAGNOSIS — E7800 Pure hypercholesterolemia, unspecified: Secondary | ICD-10-CM

## 2018-06-02 DIAGNOSIS — Z1159 Encounter for screening for other viral diseases: Secondary | ICD-10-CM

## 2018-06-02 DIAGNOSIS — Z0000 Encounter for general adult medical examination without abnormal findings: Secondary | ICD-10-CM | POA: Diagnosis not present

## 2018-06-02 MED ORDER — AMLODIPINE BESYLATE 2.5 MG PO TABS
2.5000 mg | ORAL_TABLET | Freq: Every day | ORAL | 0 refills | Status: DC
Start: 2018-06-02 — End: 2018-06-30

## 2018-06-02 MED ORDER — LOSARTAN POTASSIUM-HCTZ 100-25 MG PO TABS
1.0000 | ORAL_TABLET | Freq: Every day | ORAL | 0 refills | Status: DC
Start: 2018-06-02 — End: 2018-06-30

## 2018-06-02 MED ORDER — SILDENAFIL CITRATE 20 MG PO TABS: 20.0000 mg | ORAL_TABLET | Freq: Three times a day (TID) | ORAL | 0 refills | Status: DC

## 2018-06-02 NOTE — Progress Notes (Signed)
Patient: Carlos Conley, Male    DOB: 1951/06/08, 67 y.o.   MRN: [id number]  Visit Date: 06/02/2018  Today's Provider: Loistine Chance, MD   Chief Complaint  Patient presents with  . Welcome to Medicare    Subjective:   Carlos Conley is a 67 y.o. male who presents today for his Subsequent Annual Wellness Visit also follow up  HTN: last rx of losartan hctz was given 18 months ago, he states he takes it sporadically , took medication this morning, but bp is elevated even after rest. No chest pain or palpitation. Explained importance of compliance with medication   Hyperlipidemia: not on statin therapy we will recheck levels  History of prostate cancer: last PSA was done 09/2017 and it was below 0.5. No longer seeing Dr. Donella Stade going to have it monitored at our office  ED from complications of prostatectomy: he takes viagra prn and would like a refill.   Patient/Caregiver input:    HPI  Review of Systems   Constitutional: Negative for fever , positive for weight change.  Respiratory: Negative for cough and shortness of breath.   Cardiovascular: Negative for chest pain or palpitations.  Gastrointestinal: Negative for abdominal pain, no bowel changes.  Musculoskeletal: Negative for gait problem or joint swelling.  Skin: Negative for rash.  Neurological: Negative for dizziness or headache.  No other specific complaints in a complete review of systems (except as listed in HPI above).  Past Medical History:  Diagnosis Date  . Arthritis   . Cancer [facility])    Prostate  . Hyperlipidemia   . Hypertension     Past Surgical History:  Procedure Laterality Date  . COLONOSCOPY WITH PROPOFOL N/A 07/28/2016   Procedure: COLONOSCOPY WITH PROPOFOL;  Surgeon: Lucilla Lame, MD;  Location: [facility] ENDOSCOPY;  Service: Endoscopy;  Laterality: N/A;  . PROSTATE SURGERY  2002  . PROSTATE SURGERY  2002  . TOTAL HIP ARTHROPLASTY Right 05/27/2017   Procedure: TOTAL HIP ARTHROPLASTY ANTERIOR  APPROACH;  Surgeon: Hessie Knows, MD;  Location: [facility] ORS;  Service: Orthopedics;  Laterality: Right;    Family History  Problem Relation Age of Onset  . Cancer Father   . Leukemia Father   . Hypertension Sister   . Hypertension Brother   . Hypertension Sister   . Hypertension Sister     Social History   Socioeconomic History  . Marital status: Married    Spouse name: [name]  . Number of children: 3  . Years of education: Not on file  . Highest education level: Some college, no degree  Occupational History  . Occupation: Retired  Scientific laboratory technician  . Financial resource strain: Not hard at all  . Food insecurity:    Worry: Never true    Inability: Never true  . Transportation needs:    Medical: No    Non-medical: No  Tobacco Use  . Smoking status: Never Smoker  . Smokeless tobacco: Never Used  Substance and Sexual Activity  . Alcohol use: Yes    Alcohol/week: 2.0 - 3.0 standard drinks    Types: 2 - 3 Cans of beer per week    Comment: daily  . Drug use: No  . Sexual activity: Yes    Partners: Female  Lifestyle  . Physical activity:    Days per week: 0 days    Minutes per session: 0 min  . Stress: Not at all  Relationships  . Social connections:    Talks on phone: More than three times  a week    Gets together: More than three times a week    Attends religious service: More than 4 times per year    Active member of club or organization: Yes    Attends meetings of clubs or organizations: More than 4 times per year    Relationship status: Married  . Intimate partner violence:    Fear of current or ex partner: No    Emotionally abused: No    Physically abused: No    Forced sexual activity: No  Other Topics Concern  . Not on file  Social History Narrative  . Not on file    Outpatient Encounter Medications as of 06/02/2018  Medication Sig  . acetaminophen (TYLENOL) 500 MG tablet Take 1 tablet (500 mg total) by mouth every 6 (six) hours as needed.  Marland Kitchen aspirin EC 81  MG tablet Take 1 tablet (81 mg total) by mouth daily.  Marland Kitchen losartan-hydrochlorothiazide (HYZAAR) 100-25 MG tablet Take 1 tablet by mouth daily.  . [DISCONTINUED] losartan-hydrochlorothiazide (HYZAAR) 100-25 MG tablet Take 1 tablet by mouth daily.  Marland Kitchen amLODipine (NORVASC) 2.5 MG tablet Take 1 tablet (2.5 mg total) by mouth daily.  . sildenafil (REVATIO) 20 MG tablet Take 1-5 tablets (20-100 mg total) by mouth 3 (three) times daily.  . [DISCONTINUED] chlorpheniramine-HYDROcodone (TUSSIONEX PENNKINETIC ER) 10-8 MG/5ML SUER Take 5 mLs by mouth at bedtime as needed. (Patient not taking: Reported on 06/02/2018)  . [DISCONTINUED] fluticasone (FLONASE) 50 MCG/ACT nasal spray Place 2 sprays into both nostrils daily. (Patient not taking: Reported on 06/02/2018)   No facility-administered encounter medications on file as of 06/02/2018.     Allergies  Allergen Reactions  . Lipitor [Atorvastatin] Rash  . Pravastatin Itching, Rash and Other (See Comments)    Redness from feet up to knees.  . Simvastatin Itching and Rash    Care Team Updated in EHR: Yes  Last Vision Exam: around 2016  Wears corrective lenses: Yes Last Dental Exam: recently, does not recall name of dentist Last Hearing Exam: today  Wears Hearing Aids: No  Functional Ability / Safety Screening 1.  Was the timed Get Up and Go test longer than 30 seconds?  yes 2.  Does the patient need help with the phone, transportation, shopping,      preparing meals, housework, laundry, medications, or managing money?  yes 3.  Does the patient's home have:  loose throw rugs in the hallway?   no      Grab bars in the bathroom? no      Handrails on the stairs?   yes      Poor lighting?   yes 4.  Has the patient noticed any hearing difficulties?   no  Diet Recall and Exercise Regimen: he usually eats lots of fruit and vegetables per day, also fish weekly very seldom red meat.   Fall Risk: no falls. See screening under Objective  Information  Depression Screen: normal screen  See screening under Objective Information  Advanced Directives: A voluntary discussion about advance care planning including the explanation and discussion of advance directives was discussed with the patient. Explanation about the health care proxy and living will was reviewed.  During this discussion, the patient was able to identify a health care proxy as wife. Does patient have a HCPOA?    no If yes, name and contact information:  Does patient have a living will or MOST form?  no  Cancer Screenings:  Skin: atypical lesion  Lung: Low Dose CT  Chest recommended if Age 49-80 years, 30 pack-year currently smoking OR have quit w/in 15years. Patient does not qualify.  Lifestyle risk factor issued reviewed: Diet, exercise, weight management, advised patient smoking is not healthy, nutrition/diet.    Prostate: history of prostatectomy  Colon: 2017  Additional Screenings:   Hepatitis B/HIV/Syphillis: not interested  Hepatitis C Screening: today  Intimate Partner Violence: negative  AAA Screen: Men age 3 to 31 years if ever smoked recommended to get a one time AAA ultrasound screening exam. Patient does not qualify.  Objective:   Vitals: BP (!) 165/98 (BP Location: Left Arm, Patient Position: Sitting, Cuff Size: Large)   Pulse 88   Temp 97.9 F (36.6 C) (Oral)   Resp 16   Ht 5' 6.54" (1.69 m)   Wt 206 lb 3.2 oz (93.5 kg)   SpO2 95%   BMI 32.75 kg/m  Body mass index is 32.75 kg/m.  Lab Results  Component Value Date   CHOL 197 02/26/2017   CHOL 184 02/04/2016   CHOL 248 (H) 06/19/2015   Lab Results  Component Value Date   HDL 47 02/26/2017   HDL 56 02/04/2016   HDL 53 06/19/2015   Lab Results  Component Value Date   LDLCALC 128 (H) 02/26/2017   LDLCALC 105 (H) 02/04/2016   LDLCALC 171 (H) 06/19/2015   Lab Results  Component Value Date   TRIG 112 02/26/2017   TRIG 113 02/04/2016   TRIG 118 06/19/2015   Lab Results   Component Value Date   CHOLHDL 4.2 02/26/2017   CHOLHDL 3.3 02/04/2016   CHOLHDL 4.7 06/19/2015   No results found for: LDLDIRECT   Hearing Screening   125Hz  250Hz  500Hz  1000Hz  2000Hz  3000Hz  4000Hz  6000Hz  8000Hz   Right ear:   Pass Pass Pass  Fail    Left ear:   Pass Pass Pass  Pass      Visual Acuity Screening   Right eye Left eye Both eyes  Without correction: 20/30 20/50 20/40   With correction:       Physical Exam   Constitutional: Patient appears well-developed and well-nourished. Obese  No distress.  HEENT: head atraumatic, normocephalic, pupils equal and reactive to light,neck supple, throat within normal limits Cardiovascular: Normal rate, regular rhythm and normal heart sounds.  No murmur heard. No BLE edema. Pulmonary/Chest: Effort normal and breath sounds normal. No respiratory distress. Abdominal: Soft.  There is no tenderness. Psychiatric: Patient has a normal mood and affect. behavior is normal. Judgment and thought content normal.   Cognitive Testing - 6-CIT  Correct? Score   What year is it? yes 0 Yes = 0    No = 4  What month is it? yes 0 Yes = 0    No = 3  Remember:     [address]ia [address], [place], [place]     What time is it? yes 0 Yes = 0    No = 3  Count backwards from 20 to 1 yes 0 Correct = 0    1 error = 2   More than 1 error = 4  Say the months of the year in reverse. yes 0 Correct = 0    1 error = 2   More than 1 error = 4  What address did I ask you to remember? yes 2 Correct = 0  1 error = 2    2 error = 4    3 error = 6    4 error = 8  All wrong = 10       TOTAL SCORE  2/28   Interpretation:  Normal  Normal (0-7) Abnormal (8-28)   Fall Risk: Fall Risk  06/02/2018 10/25/2017 03/19/2017 06/02/2016 03/26/2016  Falls in the past year? No No No No No    Depression Screen Depression screen [facility] 2/9 06/02/2018 10/25/2017 03/19/2017 06/02/2016 03/26/2016  Decreased Interest 0 0 0 0 0  Down, Depressed, Hopeless 0 0 0 0 0  PHQ - 2 Score 0 0 0 0 0    No  results found for this or any previous visit (from the past 2160 hour(s)).   Assessment & Plan:     1. Welcome to Medicare preventive visit  - EKG 12-Lead - Visual acuity screening - Hearing screening; Future  2. Essential hypertension  - EKG 12-Lead - Visual acuity screening - Hearing screening; Future - losartan-hydrochlorothiazide (HYZAAR) 100-25 MG tablet; Take 1 tablet by mouth daily.  Dispense: 30 tablet; Refill: 0 - amLODipine (NORVASC) 2.5 MG tablet; Take 1 tablet (2.5 mg total) by mouth daily.  Dispense: 30 tablet; Refill: 0 - CBC with Differential/Platelet - COMPLETE METABOLIC PANEL WITH GFR - TSH  3. Hyperglycemia   4. Pure hypercholesterolemia  - Lipid panel  5. Erectile dysfunction following radical prostatectomy  - sildenafil (REVATIO) 20 MG tablet; Take 1-5 tablets (20-100 mg total) by mouth 3 (three) times daily.  Dispense: 50 tablet; Refill: 0  6. History of prostate cancer   7. Need for hepatitis C screening test  - Hepatitis C antibody  8. Failed vision screen  He forgot his glassed  9. Failed hearing screening  Monitor , only one frequency  Exercise Activities and Dietary recommendations Goals: he will cut down on drinking beer, from 6 pack while shooting pool to at most 4 per day   Discussed health benefits of physical activity, and encouraged him to engage in regular exercise appropriate for his age and condition.   Immunization History  Administered Date(s) Administered  . Influenza, High Dose Seasonal PF 05/24/2018  . Influenza,inj,Quad PF,6+ Mos 06/19/2015, 06/02/2016, 07/09/2017  . Influenza-Unspecified 06/04/2013  . Pneumococcal Conjugate-13 05/24/2018  . Tdap 03/26/2016  . Zoster 03/26/2016    Health Maintenance  Topic Date Due  . PNA vac Low Risk Adult (2 of 2 - PPSV23) 05/25/2019  . TETANUS/TDAP  03/26/2026  . COLONOSCOPY  07/28/2026  . INFLUENZA VACCINE  Completed  . Hepatitis C Screening  Completed  . HIV Screening   Completed     Meds ordered this encounter  Medications  . losartan-hydrochlorothiazide (HYZAAR) 100-25 MG tablet    Sig: Take 1 tablet by mouth daily.    Dispense:  30 tablet    Refill:  0  . amLODipine (NORVASC) 2.5 MG tablet    Sig: Take 1 tablet (2.5 mg total) by mouth daily.    Dispense:  30 tablet    Refill:  0  . sildenafil (REVATIO) 20 MG tablet    Sig: Take 1-5 tablets (20-100 mg total) by mouth 3 (three) times daily.    Dispense:  50 tablet    Refill:  0    Current Outpatient Medications:  .  acetaminophen (TYLENOL) 500 MG tablet, Take 1 tablet (500 mg total) by mouth every 6 (six) hours as needed., Disp: 30 tablet, Rfl: 0 .  aspirin EC 81 MG tablet, Take 1 tablet (81 mg total) by mouth daily., Disp: 30 tablet, Rfl: 0 .  losartan-hydrochlorothiazide (HYZAAR) 100-25 MG tablet, Take 1 tablet by  mouth daily., Disp: 30 tablet, Rfl: 0 .  amLODipine (NORVASC) 2.5 MG tablet, Take 1 tablet (2.5 mg total) by mouth daily., Disp: 30 tablet, Rfl: 0 .  sildenafil (REVATIO) 20 MG tablet, Take 1-5 tablets (20-100 mg total) by mouth 3 (three) times daily., Disp: 50 tablet, Rfl: 0 Medications Discontinued During This Encounter  Medication Reason  . chlorpheniramine-HYDROcodone (TUSSIONEX PENNKINETIC ER) 10-8 MG/5ML SUER Completed Course  . fluticasone (FLONASE) 50 MCG/ACT nasal spray Completed Course  . losartan-hydrochlorothiazide (HYZAAR) 100-25 MG tablet Reorder    I have personally reviewed and addressed the Medicare Annual Wellness health risk assessment questionnaire and have noted the following in the patient's chart:  A.         Medical and social history & family history B.         Use of alcohol, tobacco or illicit drugs  C.         Current medications and supplements D.         Functional and Cognitive ability and status E.         Nutritional status F.         Physical activity G.        Advance directives H.         List of other physicians I.           Hospitalizations, surgeries, and ER visits in previous 12 months J.         [place] such as hearing and vision if needed, cognitive and depression L.         Referrals and appointments -   In addition, I have reviewed and discussed with patient certain preventive protocols, quality metrics, and best practice recommendations. A written personalized care plan for preventive services as well as general preventive health recommendations were provided to patient.   See attached scanned questionnaire for additional information.

## 2018-06-02 NOTE — Patient Instructions (Signed)
Preventive Care 65 Years and Older, Male Preventive care refers to lifestyle choices and visits with your health care provider that can promote health and wellness. What does preventive care include?  A yearly physical exam. This is also called an annual well check.  Dental exams once or twice a year.  Routine eye exams. Ask your health care provider how often you should have your eyes checked.  Personal lifestyle choices, including: ? Daily care of your teeth and gums. ? Regular physical activity. ? Eating a healthy diet. ? Avoiding tobacco and drug use. ? Limiting alcohol use. ? Practicing safe sex. ? Taking low doses of aspirin every day. ? Taking vitamin and mineral supplements as recommended by your health care provider. What happens during an annual well check? The services and screenings done by your health care provider during your annual well check will depend on your age, overall health, lifestyle risk factors, and family history of disease. Counseling Your health care provider may ask you questions about your:  Alcohol use.  Tobacco use.  Drug use.  Emotional well-being.  Home and relationship well-being.  Sexual activity.  Eating habits.  History of falls.  Memory and ability to understand (cognition).  Work and work environment.  Screening You may have the following tests or measurements:  Height, weight, and BMI.  Blood pressure.  Lipid and cholesterol levels. These may be checked every 5 years, or more frequently if you are over 50 years old.  Skin check.  Lung cancer screening. You may have this screening every year starting at age 55 if you have a 30-pack-year history of smoking and currently smoke or have quit within the past 15 years.  Fecal occult blood test (FOBT) of the stool. You may have this test every year starting at age 50.  Flexible sigmoidoscopy or colonoscopy. You may have a sigmoidoscopy every 5 years or a colonoscopy every 10  years starting at age 50.  Prostate cancer screening. Recommendations will vary depending on your family history and other risks.  Hepatitis C blood test.  Hepatitis B blood test.  Sexually transmitted disease (STD) testing.  Diabetes screening. This is done by checking your blood sugar (glucose) after you have not eaten for a while (fasting). You may have this done every 1-3 years.  Abdominal aortic aneurysm (AAA) screening. You may need this if you are a current or former smoker.  Osteoporosis. You may be screened starting at age 70 if you are at high risk.  Talk with your health care provider about your test results, treatment options, and if necessary, the need for more tests. Vaccines Your health care provider may recommend certain vaccines, such as:  Influenza vaccine. This is recommended every year.  Tetanus, diphtheria, and acellular pertussis (Tdap, Td) vaccine. You may need a Td booster every 10 years.  Varicella vaccine. You may need this if you have not been vaccinated.  Zoster vaccine. You may need this after age 60.  Measles, mumps, and rubella (MMR) vaccine. You may need at least one dose of MMR if you were born in 1957 or later. You may also need a second dose.  Pneumococcal 13-valent conjugate (PCV13) vaccine. One dose is recommended after age 65.  Pneumococcal polysaccharide (PPSV23) vaccine. One dose is recommended after age 65.  Meningococcal vaccine. You may need this if you have certain conditions.  Hepatitis A vaccine. You may need this if you have certain conditions or if you travel or work in places where you   may be exposed to hepatitis A.  Hepatitis B vaccine. You may need this if you have certain conditions or if you travel or work in places where you may be exposed to hepatitis B.  Haemophilus influenzae type b (Hib) vaccine. You may need this if you have certain risk factors.  Talk to your health care provider about which screenings and vaccines  you need and how often you need them. This information is not intended to replace advice given to you by your health care provider. Make sure you discuss any questions you have with your health care provider. Document Released: 09/06/2015 Document Revised: 04/29/2016 Document Reviewed: 06/11/2015 Elsevier Interactive Patient Education  2018 [employer]  

## 2018-06-03 LAB — CBC WITH DIFFERENTIAL/PLATELET
BASOS ABS: 43 {cells}/uL (ref 0–200)
Basophils Relative: 0.6 %
EOS ABS: 107 {cells}/uL (ref 15–500)
Eosinophils Relative: 1.5 %
HCT: 46.9 % (ref 38.5–50.0)
Hemoglobin: 16.3 g/dL (ref 13.2–17.1)
Lymphs Abs: 973 cells/uL (ref 850–3900)
MCH: 32.9 pg (ref 27.0–33.0)
MCHC: 34.8 g/dL (ref 32.0–36.0)
MCV: 94.6 fL (ref 80.0–100.0)
MONOS PCT: 6.5 %
MPV: 9.1 fL (ref 7.5–12.5)
Neutro Abs: 5517 cells/uL (ref 1500–7800)
Neutrophils Relative %: 77.7 %
PLATELETS: 221 10*3/uL (ref 140–400)
RBC: 4.96 10*6/uL (ref 4.20–5.80)
RDW: 13.6 % (ref 11.0–15.0)
TOTAL LYMPHOCYTE: 13.7 %
WBC: 7.1 10*3/uL (ref 3.8–10.8)
WBCMIX: 462 {cells}/uL (ref 200–950)

## 2018-06-03 LAB — COMPLETE METABOLIC PANEL WITH GFR
AG RATIO: 1.4 (calc) (ref 1.0–2.5)
ALT: 31 U/L (ref 9–46)
AST: 63 U/L — AB (ref 10–35)
Albumin: 4.1 g/dL (ref 3.6–5.1)
Alkaline phosphatase (APISO): 77 U/L (ref 40–115)
BUN/Creatinine Ratio: 4 (calc) — ABNORMAL LOW (ref 6–22)
BUN: 4 mg/dL — AB (ref 7–25)
CO2: 30 mmol/L (ref 20–32)
Calcium: 9.7 mg/dL (ref 8.6–10.3)
Chloride: 99 mmol/L (ref 98–110)
Creat: 0.91 mg/dL (ref 0.70–1.25)
GFR, EST AFRICAN AMERICAN: 102 mL/min/{1.73_m2} (ref >=60)
GFR, EST NON AFRICAN AMERICAN: 88 mL/min/{1.73_m2} (ref >=60)
GLUCOSE: 88 mg/dL (ref 65–99)
Globulin: 3 g/dL (calc) (ref 1.9–3.7)
POTASSIUM: 4.4 mmol/L (ref 3.5–5.3)
Sodium: 138 mmol/L (ref 135–146)
Total Bilirubin: 0.7 mg/dL (ref 0.2–1.2)
Total Protein: 7.1 g/dL (ref 6.1–8.1)

## 2018-06-03 LAB — HEPATITIS C ANTIBODY
Hepatitis C Ab: NONREACTIVE
SIGNAL TO CUT-OFF: 0.04 (ref <1.00)

## 2018-06-03 LAB — LIPID PANEL
CHOL/HDL RATIO: 4.8 (calc) (ref <5.0)
CHOLESTEROL: 239 mg/dL — AB (ref <200)
HDL: 50 mg/dL (ref >40)
LDL Cholesterol (Calc): 169 mg/dL (calc) — ABNORMAL HIGH
Non-HDL Cholesterol (Calc): 189 mg/dL (calc) — ABNORMAL HIGH (ref <130)
TRIGLYCERIDES: 91 mg/dL (ref <150)

## 2018-06-03 LAB — TSH: TSH: 0.88 mIU/L (ref 0.40–4.50)

## 2018-06-30 ENCOUNTER — Encounter: Payer: Self-pay | Admitting: Family Medicine

## 2018-06-30 ENCOUNTER — Ambulatory Visit (INDEPENDENT_AMBULATORY_CARE_PROVIDER_SITE_OTHER): Payer: Medicare HMO | Admitting: Family Medicine

## 2018-06-30 VITALS — BP 128/72 | HR 81 | Temp 98.4°F | Resp 16 | Ht 67.0 in | Wt 203.8 lb

## 2018-06-30 DIAGNOSIS — E7800 Pure hypercholesterolemia, unspecified: Secondary | ICD-10-CM

## 2018-06-30 DIAGNOSIS — I10 Essential (primary) hypertension: Secondary | ICD-10-CM

## 2018-06-30 MED ORDER — AMLODIPINE BESYLATE 2.5 MG PO TABS: 2.5000 mg | ORAL_TABLET | Freq: Every day | ORAL | 1 refills | Status: DC

## 2018-06-30 MED ORDER — LOSARTAN POTASSIUM-HCTZ 100-25 MG PO TABS: 1.0000 | ORAL_TABLET | Freq: Every day | ORAL | 1 refills | Status: DC

## 2018-06-30 NOTE — Progress Notes (Signed)
Name: Carlos Conley   MRN: [id number]    DOB: 03-14-1951   Date:06/30/2018       Progress Note  Subjective  Chief Complaint  Chief Complaint  Patient presents with  . Follow-up    1 month F/U  . Hypertension    Got lightheaded on one episode which was yesterday    HPI  HTN : he is now taking losartan hctz daily and we added norvasc back in 05/2018, bp today is at goal. He denies chest pain or palpitation, however he felt dizzy two days ago when he was working around the house ( chopping wood, mowing the yard, Physicist, medical ), he was outside for about 4 hours. He sat down and symptoms resolved. He states he had a banana and orange juice before he went outside, did not drink any water. Advised to stay hydrated specially when active. Monitor for orthostatic changes, continue current regiment for now  Patient Active Problem List   Diagnosis Date Noted  . History of total right hip replacement 08/11/2017  . Reflux esophagitis   . Benign neoplasm of transverse colon   . Hyperlipidemia 01/02/2016  . Essential hypertension 01/02/2016  . Hyperglycemia 01/02/2016  . ED (erectile dysfunction) of organic origin 02/16/2013  . Prostate cancer ([place]) 06/14/2012    Past Surgical History:  Procedure Laterality Date  . COLONOSCOPY WITH PROPOFOL N/A 07/28/2016   Procedure: COLONOSCOPY WITH PROPOFOL;  Surgeon: Lucilla Lame, MD;  Location: [facility] ENDOSCOPY;  Service: Endoscopy;  Laterality: N/A;  . PROSTATE SURGERY  2002  . PROSTATE SURGERY  2002  . TOTAL HIP ARTHROPLASTY Right 05/27/2017   Procedure: TOTAL HIP ARTHROPLASTY ANTERIOR APPROACH;  Surgeon: Hessie Knows, MD;  Location: [facility] ORS;  Service: Orthopedics;  Laterality: Right;    Family History  Problem Relation Age of Onset  . Cancer Father   . Leukemia Father   . Hypertension Sister   . Hypertension Brother   . Hypertension Sister   . Hypertension Sister     Social History   Socioeconomic History  . Marital status: Married     Spouse name: [name]  . Number of children: 3  . Years of education: Not on file  . Highest education level: Some college, no degree  Occupational History  . Occupation: Retired  Scientific laboratory technician  . Financial resource strain: Not hard at all  . Food insecurity:    Worry: Never true    Inability: Never true  . Transportation needs:    Medical: No    Non-medical: No  Tobacco Use  . Smoking status: Never Smoker  . Smokeless tobacco: Never Used  Substance and Sexual Activity  . Alcohol use: Yes    Alcohol/week: 2.0 - 3.0 standard drinks    Types: 2 - 3 Cans of beer per week    Comment: daily  . Drug use: No  . Sexual activity: Yes    Partners: Female  Lifestyle  . Physical activity:    Days per week: 0 days    Minutes per session: 0 min  . Stress: Not at all  Relationships  . Social connections:    Talks on phone: More than three times a week    Gets together: More than three times a week    Attends religious service: More than 4 times per year    Active member of club or organization: Yes    Attends meetings of clubs or organizations: More than 4 times per year    Relationship status:  Married  . Intimate partner violence:    Fear of current or ex partner: No    Emotionally abused: No    Physically abused: No    Forced sexual activity: No  Other Topics Concern  . Not on file  Social History Narrative  . Not on file     Current Outpatient Medications:  .  acetaminophen (TYLENOL) 500 MG tablet, Take 1 tablet (500 mg total) by mouth every 6 (six) hours as needed., Disp: 30 tablet, Rfl: 0 .  amLODipine (NORVASC) 2.5 MG tablet, Take 1 tablet (2.5 mg total) by mouth daily., Disp: 30 tablet, Rfl: 0 .  aspirin EC 81 MG tablet, Take 1 tablet (81 mg total) by mouth daily., Disp: 30 tablet, Rfl: 0 .  losartan-hydrochlorothiazide (HYZAAR) 100-25 MG tablet, Take 1 tablet by mouth daily., Disp: 30 tablet, Rfl: 0 .  sildenafil (REVATIO) 20 MG tablet, Take 1-5 tablets (20-100 mg total)  by mouth 3 (three) times daily., Disp: 50 tablet, Rfl: 0  Allergies  Allergen Reactions  . Lipitor [Atorvastatin] Rash  . Pravastatin Itching, Rash and Other (See Comments)    Redness from feet up to knees.  . Simvastatin Itching and Rash    I personally reviewed active problem list, medication list, allergies, family history, social history with the patient/caregiver today.   ROS  Constitutional: Negative for fever or weight change.  Respiratory: Negative for cough and shortness of breath.   Cardiovascular: Negative for chest pain or palpitations.  Gastrointestinal: Negative for abdominal pain, no bowel changes.  Musculoskeletal: Negative for gait problem or joint swelling.  Skin: Negative for rash.  Neurological: positive  For one episode of  dizziness ( while cutting wood and working in the yard yesterday )or headache.  No other specific complaints in a complete review of systems (except as listed in HPI above).   Objective  Vitals:   06/30/18 1039  BP: 128/72  Pulse: 81  Resp: 16  Temp: 98.4 F (36.9 C)  TempSrc: Oral  SpO2: 99%  Weight: 203 lb 12.8 oz (92.4 kg)  Height: 5\' 7"  (1.702 m)    Body mass index is 31.92 kg/m.  Physical Exam  Constitutional: Patient appears well-developed and well-nourished. Obese  No distress.  HEENT: head atraumatic, normocephalic, pupils equal and reactive to light, neck supple, throat within normal limits Cardiovascular: Normal rate, regular rhythm and normal heart sounds.  No murmur heard. No BLE edema. Pulmonary/Chest: Effort normal and breath sounds normal. No respiratory distress. Abdominal: Soft.  There is no tenderness. Psychiatric: Patient has a normal mood and affect. behavior is normal. Judgment and thought content normal.   Recent Results (from the past 2160 hour(s))  CBC with Differential/Platelet     Status: None   Collection Time: 06/02/18 11:08 AM  Result Value Ref Range   WBC 7.1 3.8 - 10.8 Thousand/uL   RBC  4.96 4.20 - 5.80 Million/uL   Hemoglobin 16.3 13.2 - 17.1 g/dL   HCT 46.9 38.5 - 50.0 %   MCV 94.6 80.0 - 100.0 fL   MCH 32.9 27.0 - 33.0 pg   MCHC 34.8 32.0 - 36.0 g/dL   RDW 13.6 11.0 - 15.0 %   Platelets 221 140 - 400 Thousand/uL   MPV 9.1 7.5 - 12.5 fL   Neutro Abs 5,517 1,500 - 7,800 cells/uL   Lymphs Abs 973 850 - 3,900 cells/uL   WBC mixed population 462 200 - 950 cells/uL   Eosinophils Absolute 107 15 - 500 cells/uL  Basophils Absolute 43 0 - 200 cells/uL   Neutrophils Relative % 77.7 %   Total Lymphocyte 13.7 %   Monocytes Relative 6.5 %   Eosinophils Relative 1.5 %   Basophils Relative 0.6 %  COMPLETE METABOLIC PANEL WITH GFR     Status: Abnormal   Collection Time: 06/02/18 11:08 AM  Result Value Ref Range   Glucose, Bld 88 65 - 99 mg/dL    Comment: .            Fasting reference interval .    BUN 4 (L) 7 - 25 mg/dL   Creat 0.91 0.70 - 1.25 mg/dL    Comment: For patients >44 years of age, the reference limit for Creatinine is approximately 13% higher for people identified as African-American. .    GFR, Est Non African American 88 > OR = 60 mL/min/1.50m2   GFR, Est African American 102 > OR = 60 mL/min/1.93m2   BUN/Creatinine Ratio 4 (L) 6 - 22 (calc)   Sodium 138 135 - 146 mmol/L   Potassium 4.4 3.5 - 5.3 mmol/L   Chloride 99 98 - 110 mmol/L   CO2 30 20 - 32 mmol/L   Calcium 9.7 8.6 - 10.3 mg/dL   Total Protein 7.1 6.1 - 8.1 g/dL   Albumin 4.1 3.6 - 5.1 g/dL   Globulin 3.0 1.9 - 3.7 g/dL (calc)   AG Ratio 1.4 1.0 - 2.5 (calc)   Total Bilirubin 0.7 0.2 - 1.2 mg/dL   Alkaline phosphatase (APISO) 77 40 - 115 U/L   AST 63 (H) 10 - 35 U/L   ALT 31 9 - 46 U/L  TSH     Status: None   Collection Time: 06/02/18 11:08 AM  Result Value Ref Range   TSH 0.88 0.40 - 4.50 mIU/L  Lipid panel     Status: Abnormal   Collection Time: 06/02/18 11:08 AM  Result Value Ref Range   Cholesterol 239 (H) <200 mg/dL   HDL 50 >40 mg/dL   Triglycerides 91 <150 mg/dL   LDL  Cholesterol (Calc) 169 (H) mg/dL (calc)    Comment: Reference range: <100 . Desirable range <100 mg/dL for primary prevention;   <70 mg/dL for patients with CHD or diabetic patients  with > or = 2 CHD risk factors. Marland Kitchen LDL-C is now calculated using the Martin-Hopkins  calculation, which is a validated novel method providing  better accuracy than the Friedewald equation in the  estimation of LDL-C.  Cresenciano Genre et al. Annamaria Helling. 5993;570(17): 2061-2068  (http://education.[facility].com/faq/FAQ164)    Total CHOL/HDL Ratio 4.8 <5.0 (calc)   Non-HDL Cholesterol (Calc) 189 (H) <130 mg/dL (calc)    Comment: For patients with diabetes plus 1 major ASCVD risk  factor, treating to a non-HDL-C goal of <100 mg/dL  (LDL-C of <70 mg/dL) is considered a therapeutic  option.   Hepatitis C antibody     Status: None   Collection Time: 06/02/18 11:08 AM  Result Value Ref Range   Hepatitis C Ab NON-REACTIVE NON-REACTI   SIGNAL TO CUT-OFF 0.04 <1.00    Comment: . HCV antibody was non-reactive. There is no laboratory  evidence of HCV infection. . In most cases, no further action is required. However, if recent HCV exposure is suspected, a test for HCV RNA (test code [phone]) is suggested. . For additional information please refer to http://education.[facility].com/faq/FAQ22v1 (This link is being provided for informational/ educational purposes only.) .       PHQ2/9: Depression screen [facility] 2/9 06/02/2018 10/25/2017  03/19/2017 06/02/2016 03/26/2016  Decreased Interest 0 0 0 0 0  Down, Depressed, Hopeless 0 0 0 0 0  PHQ - 2 Score 0 0 0 0 0     Fall Risk: Fall Risk  06/30/2018 06/02/2018 10/25/2017 03/19/2017 06/02/2016  Falls in the past year? 0 No No No No  Number falls in past yr: 0 - - - -  Injury with Fall? 0 - - - -      Assessment & Plan  1. Essential hypertension  - amLODipine (NORVASC) 2.5 MG tablet; Take 1 tablet (2.5 mg total) by mouth daily.  Dispense: 90 tablet; Refill:  1 - losartan-hydrochlorothiazide (HYZAAR) 100-25 MG tablet; Take 1 tablet by mouth daily.  Dispense: 90 tablet; Refill: 1   2. Pure hypercholesterolemia  He does not want to take statin at this time, he is going to try diet and exercise for now

## 2018-07-03 ENCOUNTER — Other Ambulatory Visit: Payer: Self-pay | Admitting: Family Medicine

## 2018-07-03 DIAGNOSIS — I10 Essential (primary) hypertension: Secondary | ICD-10-CM

## 2018-07-11 ENCOUNTER — Other Ambulatory Visit: Payer: Self-pay

## 2018-07-11 MED ORDER — HYDROCHLOROTHIAZIDE 25 MG PO TABS: 25.0000 mg | ORAL_TABLET | Freq: Every day | ORAL | 0 refills | Status: DC

## 2018-07-11 MED ORDER — LOSARTAN POTASSIUM 100 MG PO TABS: 100.0000 mg | ORAL_TABLET | Freq: Every day | ORAL | 0 refills | Status: DC

## 2018-07-11 NOTE — Telephone Encounter (Signed)
[facility] faxed [place] the Losartan-HCTZ is on backordered indefinite please separate and send in new prescriptions.

## 2018-08-31 ENCOUNTER — Encounter: Payer: Self-pay | Admitting: Family Medicine

## 2018-08-31 ENCOUNTER — Ambulatory Visit (INDEPENDENT_AMBULATORY_CARE_PROVIDER_SITE_OTHER): Payer: Medicare HMO | Admitting: Family Medicine

## 2018-08-31 VITALS — BP 140/86 | HR 89 | Temp 97.9°F | Resp 16 | Ht 66.5 in | Wt 208.9 lb

## 2018-08-31 DIAGNOSIS — Z0000 Encounter for general adult medical examination without abnormal findings: Secondary | ICD-10-CM

## 2018-08-31 DIAGNOSIS — C61 Malignant neoplasm of prostate: Secondary | ICD-10-CM

## 2018-08-31 NOTE — Progress Notes (Signed)
Name: Carlos Conley   MRN: [id number]    DOB: [birth year]   Date:08/31/2018       Progress Note  Subjective  Chief Complaint  Chief Complaint  Patient presents with  . Annual Exam    HPI  Patient presents for annual CPE .  Diet: he gained 4 lbs in the past 2 months , states not following a healthy diet during the holidays  Exercise: advised 30 minutes   Depression:  Depression screen [facility] 2/9 08/31/2018 06/02/2018 10/25/2017 03/19/2017 06/02/2016  Decreased Interest 0 0 0 0 0  Down, Depressed, Hopeless 0 0 0 0 0  PHQ - 2 Score 0 0 0 0 0  Altered sleeping 0 - - - -  Tired, decreased energy 0 - - - -  Change in appetite 0 - - - -  Feeling bad or failure about yourself  0 - - - -  Trouble concentrating 0 - - - -  Moving slowly or fidgety/restless 0 - - - -  Suicidal thoughts 0 - - - -  PHQ-9 Score 0 - - - -  Difficult doing work/chores Not difficult at all - - - -    Hypertension:  BP Readings from Last 3 Encounters:  08/31/18 140/86  06/30/18 128/72  06/02/18 (!) 165/98    Obesity: Wt Readings from Last 3 Encounters:  08/31/18 208 lb 14.4 oz (94.8 kg)  06/30/18 203 lb 12.8 oz (92.4 kg)  06/02/18 206 lb 3.2 oz (93.5 kg)   BMI Readings from Last 3 Encounters:  08/31/18 33.21 kg/m  06/30/18 31.92 kg/m  06/02/18 32.75 kg/m     Lipids:  Lab Results  Component Value Date   CHOL 239 (H) 06/02/2018   CHOL 197 02/26/2017   CHOL 184 02/04/2016   Lab Results  Component Value Date   HDL 50 06/02/2018   HDL 47 02/26/2017   HDL 56 02/04/2016   Lab Results  Component Value Date   LDLCALC 169 (H) 06/02/2018   LDLCALC 128 (H) 02/26/2017   LDLCALC 105 (H) 02/04/2016   Lab Results  Component Value Date   TRIG 91 06/02/2018   TRIG 112 02/26/2017   TRIG 113 02/04/2016   Lab Results  Component Value Date   CHOLHDL 4.8 06/02/2018   CHOLHDL 4.2 02/26/2017   CHOLHDL 3.3 02/04/2016   No results found for: LDLDIRECT Glucose:  Glucose, Bld  Date Value Ref Range  Status  06/02/2018 88 65 - 99 mg/dL Final    Comment:    .            Fasting reference interval .   05/29/2017 117 (H) 65 - 99 mg/dL Final  05/28/2017 115 (H) 65 - 99 mg/dL Final      Office Visit from 08/31/2018 in [facility]  AUDIT-C Score  5      Married STD testing and prevention (HIV/chl/gon/syphilis): N/A Hep C: negative 06/02/2018  Skin cancer: discussed atypical lesions  Colorectal cancer: up to date - 07/2016 Prostate cancer:history of prostate cancer  Lab Results  Component Value Date   PSA 0.06 10/22/2016   PSA 0.09 10/21/2015    IPSS Questionnaire (AUA-7): Over the past month.   1)  How often have you had a sensation of not emptying your bladder completely after you finish urinating?  0 - Not at all  2)  How often have you had to urinate again less than two hours after you finished urinating? 1 - Less than 1  time in 5  3)  How often have you found you stopped and started again several times when you urinated?  0 - Not at all  4) How difficult have you found it to postpone urination?  0 - Not at all  5) How often have you had a weak urinary stream?  0 - Not at all  6) How often have you had to push or strain to begin urination?  0 - Not at all  7) How many times did you most typically get up to urinate from the time you went to bed until the time you got up in the morning?  2 - 2 times  Total score:  0-7 mildly symptomatic   8-19 moderately symptomatic   20-35 severely symptomatic    Lung cancer:   Low Dose CT Chest recommended if Age 20-80 years, 30 pack-year currently smoking OR have quit w/in 15years. Patient does not qualify.   AAA:  The USPSTF recommends one-time screening with ultrasonography in men ages 71 to 25 years who have ever smoked, he never smoked  ECG:  05/2018  Advanced Care Planning: A voluntary discussion about advance care planning including the explanation and discussion of advance directives.  Discussed health care  proxy and Living will, and the patient was able to identify a health care proxy as wife  Patient does not have a living will at present time. If patient does have living will, I have requested they bring this to the clinic to be scanned in to their chart.  Patient Active Problem List   Diagnosis Date Noted  . History of total right hip replacement 08/11/2017  . Reflux esophagitis   . Benign neoplasm of transverse colon   . Hyperlipidemia 01/02/2016  . Essential hypertension 01/02/2016  . Hyperglycemia 01/02/2016  . ED (erectile dysfunction) of organic origin 02/16/2013  . Prostate cancer ([place]) 06/14/2012    Past Surgical History:  Procedure Laterality Date  . COLONOSCOPY WITH PROPOFOL N/A 07/28/2016   Procedure: COLONOSCOPY WITH PROPOFOL;  Surgeon: Lucilla Lame, MD;  Location: [facility] ENDOSCOPY;  Service: Endoscopy;  Laterality: N/A;  . PROSTATE SURGERY  2002  . PROSTATE SURGERY  2002  . TOTAL HIP ARTHROPLASTY Right 05/27/2017   Procedure: TOTAL HIP ARTHROPLASTY ANTERIOR APPROACH;  Surgeon: Hessie Knows, MD;  Location: [facility] ORS;  Service: Orthopedics;  Laterality: Right;    Family History  Problem Relation Age of Onset  . Cancer Father   . Leukemia Father   . Hypertension Sister   . Hypertension Brother   . Hypertension Sister   . Hypertension Sister     Social History   Socioeconomic History  . Marital status: Married    Spouse name: [name]  . Number of children: 3  . Years of education: Not on file  . Highest education level: Some college, no degree  Occupational History  . Occupation: Retired    Comment: Cytogeneticist at a plant   Social Needs  . Financial resource strain: Not hard at all  . Food insecurity:    Worry: Never true    Inability: Never true  . Transportation needs:    Medical: No    Non-medical: No  Tobacco Use  . Smoking status: Never Smoker  . Smokeless tobacco: Never Used  Substance and Sexual Activity  . Alcohol use: Yes    Alcohol/week:  2.0 - 3.0 standard drinks    Types: 2 - 3 Cans of beer per week    Comment: daily  .  Drug use: No  . Sexual activity: Yes    Partners: Female  Lifestyle  . Physical activity:    Days per week: 5 days    Minutes per session: 10 min  . Stress: Not at all  Relationships  . Social connections:    Talks on phone: More than three times a week    Gets together: More than three times a week    Attends religious service: More than 4 times per year    Active member of club or organization: Yes    Attends meetings of clubs or organizations: More than 4 times per year    Relationship status: Married  . Intimate partner violence:    Fear of current or ex partner: No    Emotionally abused: No    Physically abused: No    Forced sexual activity: No  Other Topics Concern  . Not on file  Social History Narrative   Retired April of 2016     Current Outpatient Medications:  .  acetaminophen (TYLENOL) 500 MG tablet, Take 1 tablet (500 mg total) by mouth every 6 (six) hours as needed., Disp: 30 tablet, Rfl: 0 .  aspirin EC 81 MG tablet, Take 1 tablet (81 mg total) by mouth daily., Disp: 30 tablet, Rfl: 0 .  hydrochlorothiazide (HYDRODIURIL) 25 MG tablet, Take 1 tablet (25 mg total) by mouth daily., Disp: 90 tablet, Rfl: 0 .  losartan (COZAAR) 100 MG tablet, Take 1 tablet (100 mg total) by mouth daily., Disp: 90 tablet, Rfl: 0 .  sildenafil (REVATIO) 20 MG tablet, Take 1-5 tablets (20-100 mg total) by mouth 3 (three) times daily., Disp: 50 tablet, Rfl: 0 .  amLODipine (NORVASC) 2.5 MG tablet, Take 1 tablet (2.5 mg total) by mouth daily. (Patient not taking: Reported on 08/31/2018), Disp: 90 tablet, Rfl: 1  Allergies  Allergen Reactions  . Lipitor [Atorvastatin] Rash  . Pravastatin Itching, Rash and Other (See Comments)    Redness from feet up to knees.  . Simvastatin Itching and Rash     ROS  Constitutional: Negative for fever or weight change.  Respiratory: Negative for cough and shortness  of breath.   Cardiovascular: Negative for chest pain or palpitations.  Gastrointestinal: Negative for abdominal pain, no bowel changes.  Musculoskeletal: Negative for gait problem or joint swelling.  Skin: Negative for rash.  Neurological: Negative for dizziness or headache.  No other specific complaints in a complete review of systems (except as listed in HPI above).   Objective  Vitals:   08/31/18 0848  BP: 140/86  Pulse: 89  Resp: 16  Temp: 97.9 F (36.6 C)  TempSrc: Oral  SpO2: 99%  Weight: 208 lb 14.4 oz (94.8 kg)  Height: 5' 6.5" (1.689 m)    Body mass index is 33.21 kg/m.  Physical Exam  Constitutional: Patient appears well-developed and well-nourished. No distress.  HENT: Head: Normocephalic and atraumatic. Ears: B TMs ok, no erythema or effusion; Nose: Nose normal. Mouth/Throat: Oropharynx is clear and moist. No oropharyngeal exudate.  Eyes: Conjunctivae and EOM are normal. Pupils are equal, round, and reactive to light. No scleral icterus.  Neck: Normal range of motion. Neck supple. No JVD present. No thyromegaly present.  Cardiovascular: Normal rate, regular rhythm and normal heart sounds.  No murmur heard. No BLE edema. Pulmonary/Chest: Effort normal and breath sounds normal. No respiratory distress. Abdominal: Soft. Bowel sounds are normal, no distension. There is no tenderness. no masses MALE GENITALIA: Normal external genitalia  RECTAL: under the  care of Dr. Baruch Gouty  Musculoskeletal: Normal range of motion, no joint effusions. No gross deformities Neurological: he is alert and oriented to person, place, and time. No cranial nerve deficit. Coordination, balance, strength, speech and gait are normal.  Skin: Skin is warm and dry. No rash noted. No erythema.  Psychiatric: Patient has a normal mood and affect. behavior is normal. Judgment and thought content normal.  Recent Results (from the past 2160 hour(s))  CBC with Differential/Platelet     Status: None    Collection Time: 06/02/18 11:08 AM  Result Value Ref Range   WBC 7.1 3.8 - 10.8 Thousand/uL   RBC 4.96 4.20 - 5.80 Million/uL   Hemoglobin 16.3 13.2 - 17.1 g/dL   HCT 46.9 38.5 - 50.0 %   MCV 94.6 80.0 - 100.0 fL   MCH 32.9 27.0 - 33.0 pg   MCHC 34.8 32.0 - 36.0 g/dL   RDW 13.6 11.0 - 15.0 %   Platelets 221 140 - 400 Thousand/uL   MPV 9.1 7.5 - 12.5 fL   Neutro Abs 5,517 1,500 - 7,800 cells/uL   Lymphs Abs 973 850 - 3,900 cells/uL   WBC mixed population 462 200 - 950 cells/uL   Eosinophils Absolute 107 15 - 500 cells/uL   Basophils Absolute 43 0 - 200 cells/uL   Neutrophils Relative % 77.7 %   Total Lymphocyte 13.7 %   Monocytes Relative 6.5 %   Eosinophils Relative 1.5 %   Basophils Relative 0.6 %  COMPLETE METABOLIC PANEL WITH GFR     Status: Abnormal   Collection Time: 06/02/18 11:08 AM  Result Value Ref Range   Glucose, Bld 88 65 - 99 mg/dL    Comment: .            Fasting reference interval .    BUN 4 (L) 7 - 25 mg/dL   Creat 0.91 0.70 - 1.25 mg/dL    Comment: For patients >74 years of age, the reference limit for Creatinine is approximately 13% higher for people identified as African-American. .    GFR, Est Non African American 88 > OR = 60 mL/min/1.7m2   GFR, Est African American 102 > OR = 60 mL/min/1.56m2   BUN/Creatinine Ratio 4 (L) 6 - 22 (calc)   Sodium 138 135 - 146 mmol/L   Potassium 4.4 3.5 - 5.3 mmol/L   Chloride 99 98 - 110 mmol/L   CO2 30 20 - 32 mmol/L   Calcium 9.7 8.6 - 10.3 mg/dL   Total Protein 7.1 6.1 - 8.1 g/dL   Albumin 4.1 3.6 - 5.1 g/dL   Globulin 3.0 1.9 - 3.7 g/dL (calc)   AG Ratio 1.4 1.0 - 2.5 (calc)   Total Bilirubin 0.7 0.2 - 1.2 mg/dL   Alkaline phosphatase (APISO) 77 40 - 115 U/L   AST 63 (H) 10 - 35 U/L   ALT 31 9 - 46 U/L  TSH     Status: None   Collection Time: 06/02/18 11:08 AM  Result Value Ref Range   TSH 0.88 0.40 - 4.50 mIU/L  Lipid panel     Status: Abnormal   Collection Time: 06/02/18 11:08 AM  Result Value Ref  Range   Cholesterol 239 (H) <200 mg/dL   HDL 50 >40 mg/dL   Triglycerides 91 <150 mg/dL   LDL Cholesterol (Calc) 169 (H) mg/dL (calc)    Comment: Reference range: <100 . Desirable range <100 mg/dL for primary prevention;   <70 mg/dL for patients with CHD  or diabetic patients  with > or = 2 CHD risk factors. Marland Kitchen LDL-C is now calculated using the Martin-Hopkins  calculation, which is a validated novel method providing  better accuracy than the Friedewald equation in the  estimation of LDL-C.  Cresenciano Genre et al. Annamaria Helling. 3536;144(31): 2061-2068  (http://education.[facility].com/faq/FAQ164)    Total CHOL/HDL Ratio 4.8 <5.0 (calc)   Non-HDL Cholesterol (Calc) 189 (H) <130 mg/dL (calc)    Comment: For patients with diabetes plus 1 major ASCVD risk  factor, treating to a non-HDL-C goal of <100 mg/dL  (LDL-C of <70 mg/dL) is considered a therapeutic  option.   Hepatitis C antibody     Status: None   Collection Time: 06/02/18 11:08 AM  Result Value Ref Range   Hepatitis C Ab NON-REACTIVE NON-REACTI   SIGNAL TO CUT-OFF 0.04 <1.00    Comment: . HCV antibody was non-reactive. There is no laboratory  evidence of HCV infection. . In most cases, no further action is required. However, if recent HCV exposure is suspected, a test for HCV RNA (test code [phone]) is suggested. . For additional information please refer to http://education.[facility].com/faq/FAQ22v1 (This link is being provided for informational/ educational purposes only.) .      PHQ2/9: Depression screen [facility] 2/9 08/31/2018 06/02/2018 10/25/2017 03/19/2017 06/02/2016  Decreased Interest 0 0 0 0 0  Down, Depressed, Hopeless 0 0 0 0 0  PHQ - 2 Score 0 0 0 0 0  Altered sleeping 0 - - - -  Tired, decreased energy 0 - - - -  Change in appetite 0 - - - -  Feeling bad or failure about yourself  0 - - - -  Trouble concentrating 0 - - - -  Moving slowly or fidgety/restless 0 - - - -  Suicidal thoughts 0 - - - -  PHQ-9  Score 0 - - - -  Difficult doing work/chores Not difficult at all - - - -    Fall Risk: Fall Risk  08/31/2018 06/30/2018 06/02/2018 10/25/2017 03/19/2017  Falls in the past year? 0 0 No No No  Number falls in past yr: 0 0 - - -  Injury with Fall? 0 0 - - -     Functional Status Survey: Is the patient deaf or have difficulty hearing?: No Does the patient have difficulty seeing, even when wearing glasses/contacts?: Yes(glasses) Does the patient have difficulty concentrating, remembering, or making decisions?: No Does the patient have difficulty walking or climbing stairs?: No Does the patient have difficulty dressing or bathing?: No Does the patient have difficulty doing errands alone such as visiting a doctor's office or shopping?: No    Assessment & Plan   1. Encounter for routine history and physical exam for male  He will return in one month and bring all his medication and check bp. He states he has been taking 2 pills but not sure which ones.   2. Malignant neoplasm of prostate ([place])  Had prostatectomy in 2003 but PSA went up in 2013 and had a round of radiation done, last PSA was 0.05, he is still seeing Dr. Donella Stade yearly.   -Prostate cancer screening and PSA options (with potential risks and benefits of testing vs not testing) were discussed along with recent recs/guidelines. -USPSTF grade A and B recommendations reviewed with patient; age-appropriate recommendations, preventive care, screening tests, etc discussed and encouraged; healthy living encouraged; see AVS for patient education given to patient -Discussed importance of 150 minutes of physical activity weekly, eat two servings  of fish weekly, eat one serving of tree nuts ( cashews, pistachios, pecans, almonds.Marland Kitchen) every other day, eat 6 servings of fruit/vegetables daily and drink plenty of water and avoid sweet beverages.

## 2018-08-31 NOTE — Patient Instructions (Addendum)
Call and ask [employer] if you can get Shingrix done here or at local pharmacy   Preventive Care 65 Years and Older, Male Preventive care refers to lifestyle choices and visits with your health care provider that can promote health and wellness. What does preventive care include?   A yearly physical exam. This is also called an annual well check.  Dental exams once or twice a year.  Routine eye exams. Ask your health care provider how often you should have your eyes checked.  Personal lifestyle choices, including: ? Daily care of your teeth and gums. ? Regular physical activity. ? Eating a healthy diet. ? Avoiding tobacco and drug use. ? Limiting alcohol use. ? Practicing safe sex. ? Taking low doses of aspirin every day. ? Taking vitamin and mineral supplements as recommended by your health care provider. What happens during an annual well check? The services and screenings done by your health care provider during your annual well check will depend on your age, overall health, lifestyle risk factors, and family history of disease. Counseling Your health care provider may ask you questions about your:  Alcohol use.  Tobacco use.  Drug use.  Emotional well-being.  Home and relationship well-being.  Sexual activity.  Eating habits.  History of falls.  Memory and ability to understand (cognition).  Work and work environment. Screening You may have the following tests or measurements:  Height, weight, and BMI.  Blood pressure.  Lipid and cholesterol levels. These may be checked every 5 years, or more frequently if you are over 50 years old.  Skin check.  Lung cancer screening. You may have this screening every year starting at age 55 if you have a 30-pack-year history of smoking and currently smoke or have quit within the past 15 years.  Colorectal cancer screening. All adults should have this screening starting at age 50 and continuing until age 75. You will have  tests every 1-10 years, depending on your results and the type of screening test. People at increased risk should start screening at an earlier age. Screening tests may include: ? Guaiac-based fecal occult blood testing. ? Fecal immunochemical test (FIT). ? Stool DNA test. ? Virtual colonoscopy. ? Sigmoidoscopy. During this test, a flexible tube with a tiny camera (sigmoidoscope) is used to examine your rectum and lower colon. The sigmoidoscope is inserted through your anus into your rectum and lower colon. ? Colonoscopy. During this test, a long, thin, flexible tube with a tiny camera (colonoscope) is used to examine your entire colon and rectum.  Prostate cancer screening. Recommendations will vary depending on your family history and other risks.  Hepatitis C blood test.  Hepatitis B blood test.  Sexually transmitted disease (STD) testing.  Diabetes screening. This is done by checking your blood sugar (glucose) after you have not eaten for a while (fasting). You may have this done every 1-3 years.  Abdominal aortic aneurysm (AAA) screening. You may need this if you are a current or former smoker.  Osteoporosis. You may be screened starting at age 70 if you are at high risk. Talk with your health care provider about your test results, treatment options, and if necessary, the need for more tests. Vaccines Your health care provider may recommend certain vaccines, such as:  Influenza vaccine. This is recommended every year.  Tetanus, diphtheria, and acellular pertussis (Tdap, Td) vaccine. You may need a Td booster every 10 years.  Varicella vaccine. You may need this if you have not been vaccinated.    Zoster vaccine. You may need this after age 60.  Measles, mumps, and rubella (MMR) vaccine. You may need at least one dose of MMR if you were born in 1957 or later. You may also need a second dose.  Pneumococcal 13-valent conjugate (PCV13) vaccine. One dose is recommended after age  65.  Pneumococcal polysaccharide (PPSV23) vaccine. One dose is recommended after age 65.  Meningococcal vaccine. You may need this if you have certain conditions.  Hepatitis A vaccine. You may need this if you have certain conditions or if you travel or work in places where you may be exposed to hepatitis A.  Hepatitis B vaccine. You may need this if you have certain conditions or if you travel or work in places where you may be exposed to hepatitis B.  Haemophilus influenzae type b (Hib) vaccine. You may need this if you have certain risk factors. Talk to your health care provider about which screenings and vaccines you need and how often you need them. This information is not intended to replace advice given to you by your health care provider. Make sure you discuss any questions you have with your health care provider. Document Released: 09/06/2015 Document Revised: 09/30/2017 Document Reviewed: 06/11/2015 Elsevier Interactive Patient Education  2019 Elsevier Inc.   

## 2018-09-16 ENCOUNTER — Encounter: Payer: Self-pay | Admitting: *Deleted

## 2018-09-26 ENCOUNTER — Ambulatory Visit: Payer: Medicare HMO | Admitting: Family Medicine

## 2018-10-10 ENCOUNTER — Other Ambulatory Visit: Payer: Self-pay | Admitting: Family Medicine

## 2018-10-24 ENCOUNTER — Inpatient Hospital Stay: Payer: Medicare HMO | Attending: Radiation Oncology

## 2018-10-24 DIAGNOSIS — C61 Malignant neoplasm of prostate: Secondary | ICD-10-CM | POA: Diagnosis not present

## 2018-10-24 LAB — PSA: PROSTATIC SPECIFIC ANTIGEN: 0.03 ng/mL (ref 0.00–4.00)

## 2018-10-31 ENCOUNTER — Ambulatory Visit: Payer: BLUE CROSS/BLUE SHIELD | Admitting: Radiation Oncology

## 2018-11-07 ENCOUNTER — Ambulatory Visit
Admission: RE | Admit: 2018-11-07 | Discharge: 2018-11-07 | Disposition: A | Discharge status: Home / Self Care | Payer: Medicare HMO | Source: Ambulatory Visit | Attending: Radiation Oncology | Admitting: Radiation Oncology

## 2018-11-07 ENCOUNTER — Ambulatory Visit (INDEPENDENT_AMBULATORY_CARE_PROVIDER_SITE_OTHER): Payer: Medicare HMO | Admitting: Family Medicine

## 2018-11-07 ENCOUNTER — Encounter (INDEPENDENT_AMBULATORY_CARE_PROVIDER_SITE_OTHER): Payer: Self-pay

## 2018-11-07 ENCOUNTER — Other Ambulatory Visit: Payer: Self-pay

## 2018-11-07 ENCOUNTER — Encounter: Payer: Self-pay | Admitting: Family Medicine

## 2018-11-07 VITALS — BP 156/78 | HR 83 | Temp 98.1°F | Resp 16 | Ht 67.0 in | Wt 210.4 lb

## 2018-11-07 VITALS — BP 181/107 | HR 81 | Temp 97.2°F | Resp 18 | Wt 213.7 lb

## 2018-11-07 DIAGNOSIS — Z923 Personal history of irradiation: Secondary | ICD-10-CM | POA: Insufficient documentation

## 2018-11-07 DIAGNOSIS — I10 Essential (primary) hypertension: Secondary | ICD-10-CM | POA: Diagnosis not present

## 2018-11-07 DIAGNOSIS — E7800 Pure hypercholesterolemia, unspecified: Secondary | ICD-10-CM | POA: Diagnosis not present

## 2018-11-07 DIAGNOSIS — C61 Malignant neoplasm of prostate: Secondary | ICD-10-CM

## 2018-11-07 DIAGNOSIS — Z789 Other specified health status: Secondary | ICD-10-CM | POA: Diagnosis not present

## 2018-11-07 DIAGNOSIS — M25531 Pain in right wrist: Secondary | ICD-10-CM

## 2018-11-07 DIAGNOSIS — N5231 Erectile dysfunction following radical prostatectomy: Secondary | ICD-10-CM

## 2018-11-07 DIAGNOSIS — Z8546 Personal history of malignant neoplasm of prostate: Secondary | ICD-10-CM | POA: Diagnosis not present

## 2018-11-07 MED ORDER — SILDENAFIL CITRATE 100 MG PO TABS: 100.0000 mg | ORAL_TABLET | Freq: Every day | ORAL | 0 refills | Status: DC | PRN

## 2018-11-07 MED ORDER — AMLODIPINE BESYLATE 5 MG PO TABS: 5.0000 mg | ORAL_TABLET | Freq: Every day | ORAL | 0 refills | Status: DC

## 2018-11-07 MED ORDER — EZETIMIBE 10 MG PO TABS: 10.0000 mg | ORAL_TABLET | Freq: Every day | ORAL | 0 refills | Status: DC

## 2018-11-07 MED ORDER — OLMESARTAN-AMLODIPINE-HCTZ 40-5-25 MG PO TABS: 1.0000 | ORAL_TABLET | Freq: Every day | ORAL | 1 refills | Status: DC

## 2018-11-07 NOTE — Progress Notes (Signed)
Radiation Oncology Follow up Note  Name: Carlos Conley   Date:   11/07/2018 MRN:  [id number] DOB: 03-17-1951    This 68 y.o. male presents to the clinic today for 16 month follow-up status post salvage radiation therapy for adenocarcinoma prostate.  REFERRING PROVIDER: Steele Sizer, MD  HPI: patient is a 68 year old male now out.16 months having completed salvage radiation therapy status post prostatectomy 2003. Seen today in routine follow-up he is doing well. He specifically denies diarrhea dysuria or any other GI/GU complaints his most recent PSA was 0.03. He is currently on androgen deprivation therapy.he specifically denies any increased lower urinary tract symptoms diarrhea or fatigue.  COMPLICATIONS OF TREATMENT: none  FOLLOW UP COMPLIANCE: keeps appointments   PHYSICAL EXAM:  BP (!) 181/107   Pulse 81   Temp (!) 97.2 F (36.2 C)   Resp 18   Wt 213 lb 10.7 oz (96.9 kg)   BMI 33.97 kg/m  Well-developed well-nourished patient in NAD. HEENT reveals PERLA, EOMI, discs not visualized.  Oral cavity is clear. No oral mucosal lesions are identified. Neck is clear without evidence of cervical or supraclavicular adenopathy. Lungs are clear to A&P. Cardiac examination is essentially unremarkable with regular rate and rhythm without murmur rub or thrill. Abdomen is benign with no organomegaly or masses noted. Motor sensory and DTR levels are equal and symmetric in the upper and lower extremities. Cranial nerves II through XII are grossly intact. Proprioception is intact. No peripheral adenopathy or edema is identified. No motor or sensory levels are noted. Crude visual fields are within normal range.  RADIOLOGY RESULTS: no currentfilms for review  PLAN: present time he is under excellent biochemical control of his prostate cancer. I believe he has one more Lupron injection. I've asked to see him back in 1 year for follow-up. Patient is to call sooner with any concerns. He continues  close follow-up care with urology.  I would like to take this opportunity to thank you for allowing me to participate in the care of your patient.Noreene Filbert, MD

## 2018-11-07 NOTE — Progress Notes (Signed)
Name: Carlos Conley   MRN: [id number]    DOB: 1950/09/24   Date:11/07/2018       Progress Note  Subjective  Chief Complaint  Chief Complaint  Patient presents with  . Follow-up    1 month F/U  . Hypertension    Denies any symptoms-has been running 145/80's at home  . Wrist Pain    Onset-Friday of last week, right wrist, intermittently-throbbing pain    HPI  Hyperlipidemia: discussed importance of starting statin therapy and he agrees, we will recheck labs after he starts Crestor in about 6 weeks.   The 10-year ASCVD risk score [name], et al., 2013) is: 24.7%   Values used to calculate the score:     Age: 68 years     Sex: Male     Is Non-Hispanic African American: Yes     Diabetic: No     Tobacco smoker: No     Systolic Blood Pressure: 315 mmHg     Is BP treated: Yes     HDL Cholesterol: 50 mg/dL     Total Cholesterol: 239 mg/dL   HTN: he states he has been taking his medications , but skips doses and bp today is very high, also states at home bp has been in the 150's range. No chest pain or palpitation. No swelling. He states that he skips doses once a twice a week.   History of prostate cancer: last PSA was done 10/2018 and it was below 0.3 He went to see Dr. Donella Stade today and was nervous   ED from complications of prostatectomy: he takes viagra prn, he would like a refill today   Right wrist pain: he shoots pool daily and also works in his yard pain is on dorsal aspect of right wrist, no redness or swelling, he stops shooting pool when the pain starts and improves, he will try a brace, advised topical medication and maybe cut down on shooting pool to a few times a week. Over the past week    Patient Active Problem List   Diagnosis Date Noted  . History of total right hip replacement 08/11/2017  . Reflux esophagitis   . Benign neoplasm of transverse colon   . Hyperlipidemia 01/02/2016  . Essential hypertension 01/02/2016  . Hyperglycemia 01/02/2016  . ED  (erectile dysfunction) of organic origin 02/16/2013  . Prostate cancer ([place]) 06/14/2012    Past Surgical History:  Procedure Laterality Date  . COLONOSCOPY WITH PROPOFOL N/A 07/28/2016   Procedure: COLONOSCOPY WITH PROPOFOL;  Surgeon: Lucilla Lame, MD;  Location: [facility] ENDOSCOPY;  Service: Endoscopy;  Laterality: N/A;  . PROSTATE SURGERY  2002  . PROSTATE SURGERY  2002  . TOTAL HIP ARTHROPLASTY Right 05/27/2017   Procedure: TOTAL HIP ARTHROPLASTY ANTERIOR APPROACH;  Surgeon: Hessie Knows, MD;  Location: [facility] ORS;  Service: Orthopedics;  Laterality: Right;    Family History  Problem Relation Age of Onset  . Cancer Father   . Leukemia Father   . Hypertension Sister   . Hypertension Brother   . Hypertension Sister   . Hypertension Sister     Social History   Socioeconomic History  . Marital status: Married    Spouse name: [name]  . Number of children: 3  . Years of education: Not on file  . Highest education level: Some college, no degree  Occupational History  . Occupation: Retired    Comment: Cytogeneticist at a plant   Social Needs  . Financial resource strain: Not  hard at all  . Food insecurity:    Worry: Never true    Inability: Never true  . Transportation needs:    Medical: No    Non-medical: No  Tobacco Use  . Smoking status: Never Smoker  . Smokeless tobacco: Never Used  Substance and Sexual Activity  . Alcohol use: Yes    Alcohol/week: 2.0 - 3.0 standard drinks    Types: 2 - 3 Cans of beer per week    Comment: daily  . Drug use: No  . Sexual activity: Yes    Partners: Female  Lifestyle  . Physical activity:    Days per week: 5 days    Minutes per session: 10 min  . Stress: Not at all  Relationships  . Social connections:    Talks on phone: More than three times a week    Gets together: More than three times a week    Attends religious service: More than 4 times per year    Active member of club or organization: Yes    Attends meetings of clubs  or organizations: More than 4 times per year    Relationship status: Married  . Intimate partner violence:    Fear of current or ex partner: No    Emotionally abused: No    Physically abused: No    Forced sexual activity: No  Other Topics Concern  . Not on file  Social History Narrative   Retired April of 2016     Current Outpatient Medications:  .  acetaminophen (TYLENOL) 500 MG tablet, Take 1 tablet (500 mg total) by mouth every 6 (six) hours as needed., Disp: 30 tablet, Rfl: 0 .  amLODipine (NORVASC) 2.5 MG tablet, Take 1 tablet (2.5 mg total) by mouth daily., Disp: 90 tablet, Rfl: 1 .  aspirin EC 81 MG tablet, Take 1 tablet (81 mg total) by mouth daily., Disp: 30 tablet, Rfl: 0 .  hydrochlorothiazide (HYDRODIURIL) 25 MG tablet, TAKE 1 TABLET BY MOUTH EVERY DAY, Disp: 90 tablet, Rfl: 0 .  losartan (COZAAR) 100 MG tablet, TAKE 1 TABLET BY MOUTH EVERY DAY, Disp: 90 tablet, Rfl: 0 .  sildenafil (REVATIO) 20 MG tablet, Take 1-5 tablets (20-100 mg total) by mouth 3 (three) times daily., Disp: 50 tablet, Rfl: 0  Allergies  Allergen Reactions  . Lipitor [Atorvastatin] Rash  . Pravastatin Itching, Rash and Other (See Comments)    Redness from feet up to knees.  . Simvastatin Itching and Rash    I personally reviewed active problem list, medication list, allergies, family history, social history with the patient/caregiver today.   ROS  Constitutional: Negative for fever or weight change.  Respiratory: Negative for cough and shortness of breath.   Cardiovascular: Negative for chest pain or palpitations.  Gastrointestinal: Negative for abdominal pain, no bowel changes.  Musculoskeletal: Negative for gait problem or joint swelling.  Skin: Negative for rash.  Neurological: Negative for dizziness or headache.  No other specific complaints in a complete review of systems (except as listed in HPI above).  Objective  Vitals:   11/07/18 0945  BP: (!) 160/102  Pulse: 83  Resp: 16   Temp: 98.1 F (36.7 C)  TempSrc: Oral  SpO2: 97%  Weight: 210 lb 6.4 oz (95.4 kg)  Height: 5\' 7"  (1.702 m)    Body mass index is 32.95 kg/m.  Physical Exam  Constitutional: Patient appears well-developed and well-nourished. Obese No distress.  HEENT: head atraumatic, normocephalic, pupils equal and reactive to light,  neck supple, throat within normal limits Cardiovascular: Normal rate, regular rhythm and normal heart sounds.  No murmur heard. No BLE edema. Pulmonary/Chest: Effort normal and breath sounds normal. No respiratory distress. Muscular skeletal: normal wrist exam  Abdominal: Soft.  There is no tenderness. Psychiatric: Patient has a normal mood and affect. behavior is normal. Judgment and thought content normal.  Recent Results (from the past 2160 hour(s))  PSA     Status: None   Collection Time: 10/24/18  8:22 AM  Result Value Ref Range   Prostatic Specific Antigen 0.03 0.00 - 4.00 ng/mL    Comment: (NOTE) While PSA levels of <=4.0 ng/ml are reported as reference range, some men with levels below 4.0 ng/ml can have prostate cancer and many men with PSA above 4.0 ng/ml do not have prostate cancer.  Other tests such as free PSA, age specific reference ranges, PSA velocity and PSA doubling time may be helpful especially in men less than 36 years old. Performed at [facility], [place] [address], [place], [place] [address]      PHQ2/9: Depression screen [facility] 2/9 11/07/2018 08/31/2018 06/02/2018 10/25/2017 03/19/2017  Decreased Interest 0 0 0 0 0  Down, Depressed, Hopeless 0 0 0 0 0  PHQ - 2 Score 0 0 0 0 0  Altered sleeping 0 0 - - -  Tired, decreased energy 0 0 - - -  Change in appetite 0 0 - - -  Feeling bad or failure about yourself  0 0 - - -  Trouble concentrating 0 0 - - -  Moving slowly or fidgety/restless 0 0 - - -  Suicidal thoughts 0 0 - - -  PHQ-9 Score 0 0 - - -  Difficult doing work/chores Not difficult at all Not difficult at all - - -      Fall Risk: Fall Risk  11/07/2018 08/31/2018 06/30/2018 06/02/2018 10/25/2017  Falls in the past year? 0 0 0 No No  Number falls in past yr: 0 0 0 - -  Injury with Fall? 0 0 0 - -    Functional Status Survey: Is the patient deaf or have difficulty hearing?: No Does the patient have difficulty seeing, even when wearing glasses/contacts?: No Does the patient have difficulty concentrating, remembering, or making decisions?: No Does the patient have difficulty walking or climbing stairs?: No Does the patient have difficulty dressing or bathing?: No Does the patient have difficulty doing errands alone such as visiting a doctor's office or shopping?: No    Assessment & Plan  1. Essential hypertension  Change from  Losartan 100, Hctz 25 and norvasc 2.5 to combo below  - Olmesartan-amLODIPine-HCTZ 40-5-25 MG TABS; Take 1 tablet by mouth daily. In place of all bp medications  Dispense: 30 tablet; Refill: 1  2. Pure hypercholesterolemia  - ezetimibe (ZETIA) 10 MG tablet; Take 1 tablet (10 mg total) by mouth daily.  Dispense: 90 tablet; Refill: 0  3. Erectile dysfunction following radical prostatectomy  - sildenafil (VIAGRA) 100 MG tablet; Take 1 tablet (100 mg total) by mouth daily as needed for erectile dysfunction.  Dispense: 30 tablet; Refill: 0  4. History of prostate cancer  Reviewed labs  5. Right wrist pain  Discussed decrease in activity that aggravates pain   6. Statin intolerance  Rash and myalgia

## 2018-12-29 ENCOUNTER — Other Ambulatory Visit: Payer: Self-pay

## 2018-12-29 ENCOUNTER — Ambulatory Visit (INDEPENDENT_AMBULATORY_CARE_PROVIDER_SITE_OTHER): Payer: Medicare HMO | Admitting: Family Medicine

## 2018-12-29 ENCOUNTER — Encounter: Payer: Self-pay | Admitting: Family Medicine

## 2018-12-29 VITALS — BP 132/74 | HR 93 | Temp 98.0°F | Resp 14 | Ht 67.0 in | Wt 209.9 lb

## 2018-12-29 DIAGNOSIS — I10 Essential (primary) hypertension: Secondary | ICD-10-CM | POA: Diagnosis not present

## 2018-12-29 DIAGNOSIS — E7800 Pure hypercholesterolemia, unspecified: Secondary | ICD-10-CM | POA: Diagnosis not present

## 2018-12-29 DIAGNOSIS — N5231 Erectile dysfunction following radical prostatectomy: Secondary | ICD-10-CM

## 2018-12-29 DIAGNOSIS — Z789 Other specified health status: Secondary | ICD-10-CM

## 2018-12-29 MED ORDER — EZETIMIBE 10 MG PO TABS: 10.0000 mg | ORAL_TABLET | Freq: Every day | ORAL | 0 refills | Status: DC

## 2018-12-29 MED ORDER — VALSARTAN-HYDROCHLOROTHIAZIDE 320-25 MG PO TABS: 1.0000 | ORAL_TABLET | Freq: Every day | ORAL | 0 refills | Status: DC

## 2018-12-29 MED ORDER — SILDENAFIL CITRATE 100 MG PO TABS: 100.0000 mg | ORAL_TABLET | Freq: Every day | ORAL | 0 refills | Status: DC | PRN

## 2018-12-29 MED ORDER — AMLODIPINE BESYLATE 5 MG PO TABS: 5.0000 mg | ORAL_TABLET | Freq: Every day | ORAL | 0 refills | Status: DC

## 2018-12-29 NOTE — Progress Notes (Signed)
Name: Carlos Conley   MRN: [id number]    DOB: 17-Mar-1951   Date:12/29/2018       Progress Note  Subjective  Chief Complaint  Chief Complaint  Patient presents with  . Follow-up    6 months  . Hypertension    pt states new med to exspensive  . Hyperlipidemia    HPI  Hyperlipidemia: last time ASCVD was very high we started him on statin but caused muscle pain, he is now on zetia and denies side effects, offered to recheck labs today but he would like to hold off for now  HTN: he is taking Azor but states too expensive since he change insurances, we will split dose into Norvasc 5 mg and Diovan 320/25 and monitor. He feels tried when bp it towards low end of normal, but he keeps moving, no dizziness. He states he has not been as active lately, but will try to walk more .   History of prostate cancer: doing well, seeing Dr. Donella Stade yearly now  ED from complications of prostatectomy: he takes viagra prn, he would like a refill today Denies any side effects    Patient Active Problem List   Diagnosis Date Noted  . History of total right hip replacement 08/11/2017  . Reflux esophagitis   . Benign neoplasm of transverse colon   . Hyperlipidemia 01/02/2016  . Essential hypertension 01/02/2016  . Hyperglycemia 01/02/2016  . ED (erectile dysfunction) of organic origin 02/16/2013  . Prostate cancer ([place]) 06/14/2012    Past Surgical History:  Procedure Laterality Date  . COLONOSCOPY WITH PROPOFOL N/A 07/28/2016   Procedure: COLONOSCOPY WITH PROPOFOL;  Surgeon: Lucilla Lame, MD;  Location: [facility] ENDOSCOPY;  Service: Endoscopy;  Laterality: N/A;  . PROSTATE SURGERY  2002  . PROSTATE SURGERY  2002  . TOTAL HIP ARTHROPLASTY Right 05/27/2017   Procedure: TOTAL HIP ARTHROPLASTY ANTERIOR APPROACH;  Surgeon: Hessie Knows, MD;  Location: [facility] ORS;  Service: Orthopedics;  Laterality: Right;    Family History  Problem Relation Age of Onset  . Cancer Father   . Leukemia Father   .  Hypertension Sister   . Hypertension Brother   . Hypertension Sister   . Hypertension Sister     Social History   Socioeconomic History  . Marital status: Married    Spouse name: [name]  . Number of children: 3  . Years of education: Not on file  . Highest education level: Some college, no degree  Occupational History  . Occupation: Retired    Comment: Cytogeneticist at a plant   Social Needs  . Financial resource strain: Not hard at all  . Food insecurity:    Worry: Never true    Inability: Never true  . Transportation needs:    Medical: No    Non-medical: No  Tobacco Use  . Smoking status: Never Smoker  . Smokeless tobacco: Never Used  Substance and Sexual Activity  . Alcohol use: Yes    Alcohol/week: 2.0 - 3.0 standard drinks    Types: 2 - 3 Cans of beer per week    Comment: daily  . Drug use: No  . Sexual activity: Yes    Partners: Female  Lifestyle  . Physical activity:    Days per week: 5 days    Minutes per session: 10 min  . Stress: Not at all  Relationships  . Social connections:    Talks on phone: More than three times a week    Gets together: More  than three times a week    Attends religious service: More than 4 times per year    Active member of club or organization: Yes    Attends meetings of clubs or organizations: More than 4 times per year    Relationship status: Married  . Intimate partner violence:    Fear of current or ex partner: No    Emotionally abused: No    Physically abused: No    Forced sexual activity: No  Other Topics Concern  . Not on file  Social History Narrative   Retired April of 2016     Current Outpatient Medications:  .  acetaminophen (TYLENOL) 500 MG tablet, Take 1 tablet (500 mg total) by mouth every 6 (six) hours as needed., Disp: 30 tablet, Rfl: 0 .  aspirin EC 81 MG tablet, Take 1 tablet (81 mg total) by mouth daily., Disp: 30 tablet, Rfl: 0 .  ezetimibe (ZETIA) 10 MG tablet, Take 1 tablet (10 mg total) by  mouth daily., Disp: 90 tablet, Rfl: 0 .  sildenafil (VIAGRA) 100 MG tablet, Take 1 tablet (100 mg total) by mouth daily as needed for erectile dysfunction., Disp: 30 tablet, Rfl: 0 .  amLODipine (NORVASC) 5 MG tablet, Take 1 tablet (5 mg total) by mouth daily., Disp: 90 tablet, Rfl: 0 .  valsartan-hydrochlorothiazide (DIOVAN-HCT) 320-25 MG tablet, Take 1 tablet by mouth daily., Disp: 90 tablet, Rfl: 0  Allergies  Allergen Reactions  . Lipitor [Atorvastatin] Rash  . Pravastatin Itching, Rash and Other (See Comments)    Redness from feet up to knees.  . Simvastatin Itching and Rash    I personally reviewed active problem list, medication list, allergies, family history with the patient/caregiver today.   ROS  Constitutional: Negative for fever or weight change.  Respiratory: Negative for cough and shortness of breath.   Cardiovascular: Negative for chest pain or palpitations.  Gastrointestinal: Negative for abdominal pain, no bowel changes.  Musculoskeletal: Negative for gait problem or joint swelling.  Skin: Negative for rash.  Neurological: Negative for dizziness or headache.  No other specific complaints in a complete review of systems (except as listed in HPI above).  Objective  Vitals:   12/29/18 0754  BP: 132/74  Pulse: 93  Resp: 14  Temp: 98 F (36.7 C)  TempSrc: Oral  SpO2: 98%  Weight: 209 lb 14.4 oz (95.2 kg)  Height: 5\' 7"  (1.702 m)    Body mass index is 32.87 kg/m.  Physical Exam  Constitutional: Patient appears well-developed and well-nourished. Obese  No distress.  HEENT: head atraumatic, normocephalic, pupils equal and reactive to light, neck supple, throat within normal limits Cardiovascular: Normal rate, regular rhythm and normal heart sounds.  No murmur heard. No BLE edema. Pulmonary/Chest: Effort normal and breath sounds normal. No respiratory distress. Abdominal: Soft.  There is no tenderness. Psychiatric: Patient has a normal mood and affect.  behavior is normal. Judgment and thought content normal.   Recent Results (from the past 2160 hour(s))  PSA     Status: None   Collection Time: 10/24/18  8:22 AM  Result Value Ref Range   Prostatic Specific Antigen 0.03 0.00 - 4.00 ng/mL    Comment: (NOTE) While PSA levels of <=4.0 ng/ml are reported as reference range, some men with levels below 4.0 ng/ml can have prostate cancer and many men with PSA above 4.0 ng/ml do not have prostate cancer.  Other tests such as free PSA, age specific reference ranges, PSA velocity and PSA doubling  time may be helpful especially in men less than 66 years old. Performed at [facility], [place] [address], [place], [place] [address]      PHQ2/9: Depression screen [facility] 2/9 12/29/2018 11/07/2018 08/31/2018 06/02/2018 10/25/2017  Decreased Interest 0 0 0 0 0  Down, Depressed, Hopeless 0 0 0 0 0  PHQ - 2 Score 0 0 0 0 0  Altered sleeping 0 0 0 - -  Tired, decreased energy 0 0 0 - -  Change in appetite 0 0 0 - -  Feeling bad or failure about yourself  0 0 0 - -  Trouble concentrating 0 0 0 - -  Moving slowly or fidgety/restless 0 0 0 - -  Suicidal thoughts 0 0 0 - -  PHQ-9 Score 0 0 0 - -  Difficult doing work/chores Not difficult at all Not difficult at all Not difficult at all - -    phq 9 is negative   Fall Risk: Fall Risk  12/29/2018 11/07/2018 08/31/2018 06/30/2018 06/02/2018  Falls in the past year? 0 0 0 0 No  Number falls in past yr: 0 0 0 0 -  Injury with Fall? 0 0 0 0 -    Functional Status Survey: Is the patient deaf or have difficulty hearing?: No Does the patient have difficulty seeing, even when wearing glasses/contacts?: No Does the patient have difficulty concentrating, remembering, or making decisions?: No Does the patient have difficulty walking or climbing stairs?: No Does the patient have difficulty dressing or bathing?: No Does the patient have difficulty doing errands alone such as visiting a doctor's office or shopping?:  No    Assessment & Plan  1. Essential hypertension  - amLODipine (NORVASC) 5 MG tablet; Take 1 tablet (5 mg total) by mouth daily.  Dispense: 90 tablet; Refill: 0 - valsartan-hydrochlorothiazide (DIOVAN-HCT) 320-25 MG tablet; Take 1 tablet by mouth daily.  Dispense: 90 tablet; Refill: 0  2. Erectile dysfunction following radical prostatectomy  - sildenafil (VIAGRA) 100 MG tablet; Take 1 tablet (100 mg total) by mouth daily as needed for erectile dysfunction.  Dispense: 30 tablet; Refill: 0  3. Pure hypercholesterolemia  - ezetimibe (ZETIA) 10 MG tablet; Take 1 tablet (10 mg total) by mouth daily.  Dispense: 90 tablet; Refill: 0 He wants to hold off on labs at this time   4. Statin intolerance

## 2018-12-30 ENCOUNTER — Other Ambulatory Visit: Payer: Self-pay | Admitting: Family Medicine

## 2018-12-30 DIAGNOSIS — I10 Essential (primary) hypertension: Secondary | ICD-10-CM

## 2019-01-30 ENCOUNTER — Telehealth: Payer: Self-pay | Admitting: Internal Medicine

## 2019-01-30 ENCOUNTER — Other Ambulatory Visit: Payer: Medicare HMO

## 2019-01-30 DIAGNOSIS — R6889 Other general symptoms and signs: Secondary | ICD-10-CM | POA: Diagnosis not present

## 2019-01-30 DIAGNOSIS — Z20822 Contact with and (suspected) exposure to covid-19: Secondary | ICD-10-CM

## 2019-01-30 NOTE — Telephone Encounter (Signed)
Appt scheduled at [facility] at 2:45 pm

## 2019-01-30 NOTE — Telephone Encounter (Signed)
Thayer Headings from [facility] Dept called to get pt scheduled for Covid testing. Order placed

## 2019-02-02 LAB — NOVEL CORONAVIRUS, NAA: SARS-CoV-2, NAA: NOT DETECTED

## 2019-02-03 ENCOUNTER — Telehealth: Payer: Self-pay | Admitting: Family Medicine

## 2019-02-03 NOTE — Telephone Encounter (Signed)
Copied from [place] [id number]. Topic: Quick Communication - Lab Results (Clinic Use ONLY) >> Feb 03, 2019 12:50 PM Erick Blinks wrote: Made aware of negative results

## 2019-03-18 ENCOUNTER — Encounter (HOSPITAL_COMMUNITY): Payer: Self-pay | Admitting: Emergency Medicine

## 2019-03-18 ENCOUNTER — Emergency Department (HOSPITAL_COMMUNITY): Payer: Medicare HMO

## 2019-03-18 ENCOUNTER — Emergency Department (HOSPITAL_COMMUNITY)
Admission: EM | Admit: 2019-03-18 | Discharge: 2019-03-18 | Disposition: A | Discharge status: Home / Self Care | Payer: Medicare HMO | Attending: Emergency Medicine | Admitting: Emergency Medicine

## 2019-03-18 ENCOUNTER — Other Ambulatory Visit: Payer: Self-pay

## 2019-03-18 DIAGNOSIS — I10 Essential (primary) hypertension: Secondary | ICD-10-CM | POA: Insufficient documentation

## 2019-03-18 DIAGNOSIS — Z79899 Other long term (current) drug therapy: Secondary | ICD-10-CM | POA: Diagnosis not present

## 2019-03-18 DIAGNOSIS — R55 Syncope and collapse: Secondary | ICD-10-CM | POA: Diagnosis not present

## 2019-03-18 DIAGNOSIS — R569 Unspecified convulsions: Secondary | ICD-10-CM | POA: Diagnosis not present

## 2019-03-18 DIAGNOSIS — G40909 Epilepsy, unspecified, not intractable, without status epilepticus: Secondary | ICD-10-CM | POA: Diagnosis not present

## 2019-03-18 DIAGNOSIS — Z7982 Long term (current) use of aspirin: Secondary | ICD-10-CM | POA: Insufficient documentation

## 2019-03-18 LAB — URINALYSIS, ROUTINE W REFLEX MICROSCOPIC
Bilirubin Urine: NEGATIVE
Glucose, UA: NEGATIVE mg/dL
Hgb urine dipstick: NEGATIVE
Ketones, ur: NEGATIVE mg/dL
Leukocytes,Ua: NEGATIVE
Nitrite: NEGATIVE
Protein, ur: NEGATIVE mg/dL
Specific Gravity, Urine: 1.004 — ABNORMAL LOW (ref 1.005–1.030)
pH: 6 (ref 5.0–8.0)

## 2019-03-18 LAB — CBC WITH DIFFERENTIAL/PLATELET
Abs Immature Granulocytes: 0.04 10*3/uL (ref 0.00–0.07)
Basophils Absolute: 0 10*3/uL (ref 0.0–0.1)
Basophils Relative: 0 %
Eosinophils Absolute: 0.2 10*3/uL (ref 0.0–0.5)
Eosinophils Relative: 2 %
HCT: 45.4 % (ref 39.0–52.0)
Hemoglobin: 14.8 g/dL (ref 13.0–17.0)
Immature Granulocytes: 1 %
Lymphocytes Relative: 8 %
Lymphs Abs: 0.7 10*3/uL (ref 0.7–4.0)
MCH: 32.5 pg (ref 26.0–34.0)
MCHC: 32.6 g/dL (ref 30.0–36.0)
MCV: 99.6 fL (ref 80.0–100.0)
Monocytes Absolute: 0.5 10*3/uL (ref 0.1–1.0)
Monocytes Relative: 6 %
Neutro Abs: 7.2 10*3/uL (ref 1.7–7.7)
Neutrophils Relative %: 83 %
Platelets: 221 10*3/uL (ref 150–400)
RBC: 4.56 MIL/uL (ref 4.22–5.81)
RDW: 13.9 % (ref 11.5–15.5)
WBC: 8.6 10*3/uL (ref 4.0–10.5)
nRBC: 0 % (ref 0.0–0.2)

## 2019-03-18 LAB — COMPREHENSIVE METABOLIC PANEL
ALT: 33 U/L (ref 0–44)
AST: 43 U/L — ABNORMAL HIGH (ref 15–41)
Albumin: 3.6 g/dL (ref 3.5–5.0)
Alkaline Phosphatase: 52 U/L (ref 38–126)
Anion gap: 9 (ref 5–15)
BUN: 6 mg/dL — ABNORMAL LOW (ref 8–23)
CO2: 25 mmol/L (ref 22–32)
Calcium: 8.8 mg/dL — ABNORMAL LOW (ref 8.9–10.3)
Chloride: 102 mmol/L (ref 98–111)
Creatinine, Ser: 0.91 mg/dL (ref 0.61–1.24)
GFR calc Af Amer: >60 mL/min (ref >60)
GFR calc non Af Amer: >60 mL/min (ref >60)
Glucose, Bld: 121 mg/dL — ABNORMAL HIGH (ref 70–99)
Potassium: 3.4 mmol/L — ABNORMAL LOW (ref 3.5–5.1)
Sodium: 136 mmol/L (ref 135–145)
Total Bilirubin: 0.7 mg/dL (ref 0.3–1.2)
Total Protein: 7 g/dL (ref 6.5–8.1)

## 2019-03-18 LAB — CBG MONITORING, ED: Glucose-Capillary: 147 mg/dL — ABNORMAL HIGH (ref 70–99)

## 2019-03-18 MED ORDER — LEVETIRACETAM 500 MG PO TABS: 500.0000 mg | ORAL_TABLET | Freq: Two times a day (BID) | ORAL | 1 refills | Status: DC

## 2019-03-18 MED ORDER — LEVETIRACETAM IN NACL 1000 MG/100ML IV SOLN
1000.0000 mg | Freq: Once | INTRAVENOUS | Status: AC
  Administered 2019-03-18: 23:00:00 1000 mg via INTRAVENOUS
  Filled 2019-03-18: qty 100

## 2019-03-18 NOTE — ED Triage Notes (Signed)
Patient does have blood on his tongue.

## 2019-03-18 NOTE — ED Provider Notes (Signed)
[facility] EMERGENCY DEPARTMENT Provider Note   CSN: [id number] Arrival date & time: 03/18/19  1913    History   Chief Complaint Chief Complaint  Patient presents with  . Loss of Consciousness    questionable seizure    HPI Carlos Conley is a 68 y.o. male.     HPI  The patient is a 68 year old male with a known history of prostate cancer, hypertension, hyperlipidemia.  He also reports to me a childhood history of seizures.  He states he has not had seizures since he was very very young.  That being said approximately 1 week ago the patient had some type of passing out spell after having excessive amounts of diarrhea, the diarrhea stopped, over the last week he did not have been any passing out or seizure activity though the initial one did have some shaking afterwards.  Today after having another episode of diarrhea and some abdominal cramping as well, he had another spell where he passed out, his eyes rolled back and there was some brief shaking afterwards.  The patient states that he did bite his tongue, he recalls feeling like he was going to pass out but does not remember anything after that.  He denies headaches, denies nausea or vomiting, denies fevers or chills, denies coughing or shortness of breath or chest pain.  Denies any cardiac or pulmonary history.  He reports that he does not take any antiseizure medications and never has.  At this time the patient has complained only of some abdominal cramps and diarrhea but states it was just loose.  No blood in the stool, no recent travel, no recent antibiotics.  Past Medical History:  Diagnosis Date  . Arthritis   . Cancer [facility])    Prostate  . Hyperlipidemia   . Hypertension     Patient Active Problem List   Diagnosis Date Noted  . History of total right hip replacement 08/11/2017  . Reflux esophagitis   . Benign neoplasm of transverse colon   . Hyperlipidemia 01/02/2016  . Essential hypertension 01/02/2016  .  Hyperglycemia 01/02/2016  . ED (erectile dysfunction) of organic origin 02/16/2013  . Prostate cancer ([place]) 06/14/2012    Past Surgical History:  Procedure Laterality Date  . COLONOSCOPY WITH PROPOFOL N/A 07/28/2016   Procedure: COLONOSCOPY WITH PROPOFOL;  Surgeon: Lucilla Lame, MD;  Location: [facility] ENDOSCOPY;  Service: Endoscopy;  Laterality: N/A;  . PROSTATE SURGERY  2002  . PROSTATE SURGERY  2002  . TOTAL HIP ARTHROPLASTY Right 05/27/2017   Procedure: TOTAL HIP ARTHROPLASTY ANTERIOR APPROACH;  Surgeon: Hessie Knows, MD;  Location: [facility] ORS;  Service: Orthopedics;  Laterality: Right;        Home Medications    Prior to Admission medications   Medication Sig Start Date End Date Taking? Authorizing Provider  amLODipine (NORVASC) 5 MG tablet Take 1 tablet (5 mg total) by mouth daily. 12/29/18  Yes Sowles, Drue Stager, MD  ezetimibe (ZETIA) 10 MG tablet Take 1 tablet (10 mg total) by mouth daily. 12/29/18  Yes Sowles, Drue Stager, MD  valsartan-hydrochlorothiazide (DIOVAN-HCT) 320-25 MG tablet Take 1 tablet by mouth daily. 12/29/18  Yes Sowles, Drue Stager, MD  acetaminophen (TYLENOL) 500 MG tablet Take 1 tablet (500 mg total) by mouth every 6 (six) hours as needed. 04/19/17   Steele Sizer, MD  aspirin EC 81 MG tablet Take 1 tablet (81 mg total) by mouth daily. 01/02/16   Steele Sizer, MD  levETIRAcetam (KEPPRA) 500 MG tablet Take 1 tablet (500 mg total)  by mouth 2 (two) times daily. 03/18/19   Noemi Chapel, MD  sildenafil (VIAGRA) 100 MG tablet Take 1 tablet (100 mg total) by mouth daily as needed for erectile dysfunction. 12/29/18   Steele Sizer, MD    Family History Family History  Problem Relation Age of Onset  . Cancer Father   . Leukemia Father   . Hypertension Sister   . Hypertension Brother   . Hypertension Sister   . Hypertension Sister     Social History Social History   Tobacco Use  . Smoking status: Never Smoker  . Smokeless tobacco: Never Used  Substance Use Topics  .  Alcohol use: Yes    Alcohol/week: 2.0 - 3.0 standard drinks    Types: 2 - 3 Cans of beer per week    Comment: daily  . Drug use: No     Allergies   Lipitor [atorvastatin], Pravastatin, and Simvastatin   Review of Systems Review of Systems  All other systems reviewed and are negative.    Physical Exam Updated Vital Signs BP 140/88   Pulse 82   Temp 98.4 F (36.9 C) (Oral)   Resp 16   Ht 1.702 m (5\' 7" )   Wt 92.5 kg   SpO2 97%   BMI 31.95 kg/m   Physical Exam Vitals signs and nursing note reviewed.  Constitutional:      General: He is not in acute distress.    Appearance: He is well-developed.  HENT:     Head: Normocephalic and atraumatic.     Comments: Slight bite to the left side of the tongue, no laceration, dentition intact, no blood in the pharynx    Mouth/Throat:     Pharynx: No oropharyngeal exudate.  Eyes:     General: No scleral icterus.       Right eye: No discharge.        Left eye: No discharge.     Conjunctiva/sclera: Conjunctivae normal.     Pupils: Pupils are equal, round, and reactive to light.  Neck:     Musculoskeletal: Normal range of motion and neck supple.     Thyroid: No thyromegaly.     Vascular: No JVD.  Cardiovascular:     Rate and Rhythm: Normal rate and regular rhythm.     Heart sounds: Normal heart sounds. No murmur. No friction rub. No gallop.   Pulmonary:     Effort: Pulmonary effort is normal. No respiratory distress.     Breath sounds: Normal breath sounds. No wheezing or rales.  Abdominal:     General: Bowel sounds are normal. There is no distension.     Palpations: Abdomen is soft. There is no mass.     Tenderness: There is no abdominal tenderness.     Comments: Well-healed scars on the left side of his abdomen.  Overall very soft with slight increased bowel sounds  Musculoskeletal: Normal range of motion.        General: No tenderness.  Lymphadenopathy:     Cervical: No cervical adenopathy.  Skin:    General: Skin is  warm and dry.     Findings: No erythema or rash.  Neurological:     Mental Status: He is alert.     Coordination: Coordination normal.     Comments: The patient is able to move all 4 extremities, follows commands without difficulty, speech is clear, able to perform normal coordination tasks, memory seems to be intact.  Psychiatric:        Behavior:  Behavior normal.      ED Treatments / Results  Labs (all labs ordered are listed, but only abnormal results are displayed) Labs Reviewed  COMPREHENSIVE METABOLIC PANEL - Abnormal; Notable for the following components:      Result Value   Potassium 3.4 (*)    Glucose, Bld 121 (*)    BUN 6 (*)    Calcium 8.8 (*)    AST 43 (*)    All other components within normal limits  URINALYSIS, ROUTINE W REFLEX MICROSCOPIC - Abnormal; Notable for the following components:   Color, Urine STRAW (*)    Specific Gravity, Urine 1.004 (*)    All other components within normal limits  CBG MONITORING, ED - Abnormal; Notable for the following components:   Glucose-Capillary 147 (*)    All other components within normal limits  CBC WITH DIFFERENTIAL/PLATELET    EKG None  Radiology Ct Head Wo Contrast  Result Date: 03/18/2019 CLINICAL DATA:  New nontraumatic seizure EXAM: CT HEAD WITHOUT CONTRAST TECHNIQUE: Contiguous axial images were obtained from the base of the skull through the vertex without intravenous contrast. COMPARISON:  12/20/2008 FINDINGS: Brain: Mild generalized atrophy. Normal ventricular morphology. No midline shift or mass effect. Scattered dural calcifications of falx. No intracranial hemorrhage, mass lesion, or evidence of acute infarction. No extra-axial fluid collections. Vascular: Atherosclerotic calcifications of internal carotid and vertebral arteries at skull base Skull: Intact Sinuses/Orbits: Small amount of fluid or mucus within the sphenoid sinus. Remaining visualized paranasal sinuses and mastoid air cells clear Other: N/A  IMPRESSION: No acute intracranial abnormalities. Electronically Signed   By: Lavonia Dana M.D.   On: 03/18/2019 20:17    Procedures Procedures (including critical care time)  Medications Ordered in ED Medications  levETIRAcetam (KEPPRA) IVPB 1000 mg/100 mL premix (has no administration in time range)     Initial Impression / Assessment and Plan / ED Course  I have reviewed the triage vital signs and the nursing notes.  Pertinent labs & imaging results that were available during my care of the patient were reviewed by me and considered in my medical decision making (see chart for details).        At this time the patient actually appears well however it is a possibility that the patient is actually had seizures versus syncope given his diarrhea.  At this time he will undergo CT scan of the brain to make sure he does not have metastatic cancer or other sources of anatomic lesions.  He will also need to have lab work including ruling out hyponatremia, severe dehydration.  EKG will be performed to rule out arrhythmia.  The patient may well need to be started on antiepileptics until he follows up with neurology.  He is awake alert and amenable to the plan.  I reviewed his vital signs which appear normal.  Labs are unremarkable, potassium was 3.4 and urinalysis was totally clean without signs of dehydration.  Blood counts normal and CT scan without evidence of tumors or other masses, metastatic disease, hemorrhage or other concerns.  The patient has not had any seizure activity here.  It is unclear exactly what caused the syncopal episode or seizure activity earlier but given that he has had a couple of these in the last 2 weeks as well as a childhood history of possible seizures I would feel more comfortable starting antiepileptic medications at this time.  He will be given 1 g of Keppra prior to discharge and encouraged to follow-up with neurology.  The patient has expressed his understanding.   Final Clinical Impressions(s) / ED Diagnoses   Final diagnoses:  Seizure ([place])  Syncope, unspecified syncope type    ED Discharge Orders         Ordered    levETIRAcetam (KEPPRA) 500 MG tablet  2 times daily     03/18/19 2229           Noemi Chapel, MD 03/18/19 2230

## 2019-03-18 NOTE — ED Triage Notes (Signed)
Patient states he was at home and had an episode where he passed out. His wife reported that he was shaking for a few minutes before regaining consciousness. Patient states that he had one seizure last week but did not seek medical treatment. Patient states that he does not take any medication for seizure and is current complaining of abdominal pain.

## 2019-03-18 NOTE — Discharge Instructions (Signed)
Your x-ray of the brain and blood work were unremarkable and reassuring You may very well of had a seizure and will need to start taking medication Please take Keppra, 1 tablet twice daily You will need to follow-up with a neurologist.  I have given you the phone number above.  Call the office on Monday morning for the next available appointment but you should be seen within 1 to 2 weeks. If you should develop ongoing seizures, passing out spells or any severe or worsening symptoms please return to the emergency department immediately. Please call your family doctor to let them know about this medication change Until you are cleared by the neurologist you should not swim or drive a vehicle or do anything that may harm somebody else if you have a seizure while doing it.  It is against the law to drive until you are cleared by the neurologist.

## 2019-03-21 ENCOUNTER — Other Ambulatory Visit: Payer: Self-pay | Admitting: Family Medicine

## 2019-03-21 DIAGNOSIS — I10 Essential (primary) hypertension: Secondary | ICD-10-CM

## 2019-03-21 NOTE — Telephone Encounter (Signed)
Hypertension medication request: Valsartan HCTZZ  Last office visit pertaining to hypertension: 12/29/2018   BP Readings from Last 3 Encounters:  03/18/19 (!) 134/93  12/29/18 132/74  11/07/18 (!) 181/107    Lab Results  Component Value Date   CREATININE 0.91 03/18/2019   BUN 6 (L) 03/18/2019   NA 136 03/18/2019   K 3.4 (L) 03/18/2019   CL 102 03/18/2019   CO2 25 03/18/2019     Follow up on 03/24/2019

## 2019-03-24 ENCOUNTER — Ambulatory Visit (INDEPENDENT_AMBULATORY_CARE_PROVIDER_SITE_OTHER): Payer: Medicare HMO | Admitting: Family Medicine

## 2019-03-24 ENCOUNTER — Other Ambulatory Visit: Payer: Self-pay

## 2019-03-24 ENCOUNTER — Encounter: Payer: Self-pay | Admitting: Family Medicine

## 2019-03-24 VITALS — BP 138/78 | HR 86 | Temp 97.3°F | Resp 14 | Ht 67.0 in | Wt 211.2 lb

## 2019-03-24 DIAGNOSIS — R195 Other fecal abnormalities: Secondary | ICD-10-CM | POA: Diagnosis not present

## 2019-03-24 DIAGNOSIS — R569 Unspecified convulsions: Secondary | ICD-10-CM | POA: Diagnosis not present

## 2019-03-24 NOTE — Progress Notes (Signed)
Name: Carlos Conley   MRN: [id number]    DOB: 10-30-1950   Date:03/24/2019       Progress Note  Subjective  Chief Complaint  Chief Complaint  Patient presents with  . Follow-up    ER for seizures, mdicationmakes him dizzy  . Referral    Neurologist    HPI  PT presents today for ER follow up - he was seen on 03/18/2019 for seizure.  He has history of seizures as a child, and notes he maybe has 1 seizure once a year.  He notes was drinking about 4 beers a day, but had quit on the 16th of July, had first seizure on the 18th.  He also notes that he drank a small amount on July 25th, just prior to his 2nd seizure.  He was given loading dose of keppra and PO Rx for home dosing, but he felt that the medication made him drowsy.  Does not want to restart until he talks to a neurologist.  His wife did drive him here today - he is not allowed to drive until cleared by speciality, and he verbalizes understanding.  - He does note some darker stools lately - he had colonoscopy in 2017 and was told to repeat in 10 years.  No blood in stool, no abdominal pain, no history of colorectal cancer in family.  he drank about 10 beers that day, and he normally drinks only about 4 a day.   - CBC - normal; CMP showed mild hypokaliemia, slightly decreased BUN and very mildly elevated AST.  We will recheck today to trend. CT Head negative  Patient Active Problem List   Diagnosis Date Noted  . History of total right hip replacement 08/11/2017  . Reflux esophagitis   . Benign neoplasm of transverse colon   . Hyperlipidemia 01/02/2016  . Essential hypertension 01/02/2016  . Hyperglycemia 01/02/2016  . ED (erectile dysfunction) of organic origin 02/16/2013  . Prostate cancer ([place]) 06/14/2012    Past Surgical History:  Procedure Laterality Date  . COLONOSCOPY WITH PROPOFOL N/A 07/28/2016   Procedure: COLONOSCOPY WITH PROPOFOL;  Surgeon: Lucilla Lame, MD;  Location: [facility] ENDOSCOPY;  Service: Endoscopy;   Laterality: N/A;  . PROSTATE SURGERY  2002  . PROSTATE SURGERY  2002  . TOTAL HIP ARTHROPLASTY Right 05/27/2017   Procedure: TOTAL HIP ARTHROPLASTY ANTERIOR APPROACH;  Surgeon: Hessie Knows, MD;  Location: [facility] ORS;  Service: Orthopedics;  Laterality: Right;    Family History  Problem Relation Age of Onset  . Cancer Father   . Leukemia Father   . Hypertension Sister   . Hypertension Brother   . Hypertension Sister   . Hypertension Sister     Social History   Socioeconomic History  . Marital status: Married    Spouse name: [name]  . Number of children: 3  . Years of education: Not on file  . Highest education level: Some college, no degree  Occupational History  . Occupation: Retired    Comment: Cytogeneticist at a plant   Social Needs  . Financial resource strain: Not hard at all  . Food insecurity    Worry: Never true    Inability: Never true  . Transportation needs    Medical: No    Non-medical: No  Tobacco Use  . Smoking status: Never Smoker  . Smokeless tobacco: Never Used  Substance and Sexual Activity  . Alcohol use: Yes    Alcohol/week: 2.0 - 3.0 standard drinks  Types: 2 - 3 Cans of beer per week    Comment: daily  . Drug use: No  . Sexual activity: Yes    Partners: Female  Lifestyle  . Physical activity    Days per week: 5 days    Minutes per session: 10 min  . Stress: Not at all  Relationships  . Social connections    Talks on phone: More than three times a week    Gets together: More than three times a week    Attends religious service: More than 4 times per year    Active member of club or organization: Yes    Attends meetings of clubs or organizations: More than 4 times per year    Relationship status: Married  . Intimate partner violence    Fear of current or ex partner: No    Emotionally abused: No    Physically abused: No    Forced sexual activity: No  Other Topics Concern  . Not on file  Social History Narrative   Retired  April of 2016     Current Outpatient Medications:  .  acetaminophen (TYLENOL) 500 MG tablet, Take 1 tablet (500 mg total) by mouth every 6 (six) hours as needed., Disp: 30 tablet, Rfl: 0 .  amLODipine (NORVASC) 5 MG tablet, Take 1 tablet (5 mg total) by mouth daily., Disp: 90 tablet, Rfl: 0 .  aspirin EC 81 MG tablet, Take 1 tablet (81 mg total) by mouth daily., Disp: 30 tablet, Rfl: 0 .  ezetimibe (ZETIA) 10 MG tablet, Take 1 tablet (10 mg total) by mouth daily., Disp: 90 tablet, Rfl: 0 .  levETIRAcetam (KEPPRA) 500 MG tablet, Take 1 tablet (500 mg total) by mouth 2 (two) times daily., Disp: 60 tablet, Rfl: 1 .  sildenafil (VIAGRA) 100 MG tablet, Take 1 tablet (100 mg total) by mouth daily as needed for erectile dysfunction., Disp: 30 tablet, Rfl: 0 .  valsartan-hydrochlorothiazide (DIOVAN-HCT) 320-25 MG tablet, TAKE 1 TABLET BY MOUTH EVERY DAY, Disp: 90 tablet, Rfl: 0  Allergies  Allergen Reactions  . Lipitor [Atorvastatin] Rash  . Pravastatin Itching, Rash and Other (See Comments)    Redness from feet up to knees.  . Simvastatin Itching and Rash    I personally reviewed active problem list, medication list, allergies, notes from last encounter, lab results with the patient/caregiver today.   ROS  Constitutional: Negative for fever or weight change.  Respiratory: Negative for cough and shortness of breath.   Cardiovascular: Negative for chest pain or palpitations.  Gastrointestinal: Negative for abdominal pain, no bowel changes.  Musculoskeletal: Negative for gait problem or joint swelling.  Skin: Negative for rash.  Neurological: Negative for dizziness or headache.  No other specific complaints in a complete review of systems (except as listed in HPI above).   Objective  Vitals:   03/24/19 1355  BP: 138/78  Pulse: 86  Resp: 14  Temp: (!) 97.3 F (36.3 C)  TempSrc: Oral  SpO2: 93%  Weight: 211 lb 3.2 oz (95.8 kg)  Height: 5\' 7"  (1.702 m)   Body mass index is 33.08  kg/m.  Physical Exam  Constitutional: Patient appears well-developed and well-nourished. No distress.  HENT: Head: Normocephalic and atraumatic. Eyes: Conjunctivae and EOM are normal. No scleral icterus.  Pupils are equal, round, and reactive to light.  Neck: Normal range of motion. Neck supple. No JVD present. No thyromegaly present.  Cardiovascular: Normal rate, regular rhythm and normal heart sounds.  No murmur heard. No  BLE edema. Pulmonary/Chest: Effort normal and breath sounds normal. No respiratory distress. Musculoskeletal: Normal range of motion, no joint effusions. No gross deformities Neurological: Pt is alert and oriented to person, place, and time. No cranial nerve deficit. Coordination, balance, strength, speech and gait are normal.  Skin: Skin is warm and dry. No rash noted. No erythema.  Psychiatric: Patient has a normal mood and affect. behavior is normal. Judgment and thought content normal.  No results found for this or any previous visit (from the past 72 hour(s)).  PHQ2/9: Depression screen [facility] 2/9 03/24/2019 12/29/2018 11/07/2018 08/31/2018 06/02/2018  Decreased Interest 0 0 0 0 0  Down, Depressed, Hopeless 0 0 0 0 0  PHQ - 2 Score 0 0 0 0 0  Altered sleeping 0 0 0 0 -  Tired, decreased energy 0 0 0 0 -  Change in appetite 0 0 0 0 -  Feeling bad or failure about yourself  0 0 0 0 -  Trouble concentrating 0 0 0 0 -  Moving slowly or fidgety/restless 0 0 0 0 -  Suicidal thoughts 0 0 0 0 -  PHQ-9 Score 0 0 0 0 -  Difficult doing work/chores Not difficult at all Not difficult at all Not difficult at all Not difficult at all -   PHQ-2/9 Result is negative.    Fall Risk: Fall Risk  03/24/2019 12/29/2018 11/07/2018 08/31/2018 06/30/2018  Falls in the past year? 0 0 0 0 0  Number falls in past yr: 0 0 0 0 0  Injury with Fall? 0 0 0 0 0  Follow up Falls evaluation completed - - - -    Assessment & Plan  1. Seizures ([place]) - Ambulatory referral to Neurology - COMPLETE  METABOLIC PANEL WITH GFR  2. Dark stools - POC Hemoccult Bld/Stl (3-Cd Home Screen); Future

## 2019-03-25 LAB — COMPLETE METABOLIC PANEL WITH GFR
AG Ratio: 1.7 (calc) (ref 1.0–2.5)
ALT: 22 U/L (ref 9–46)
AST: 33 U/L (ref 10–35)
Albumin: 4 g/dL (ref 3.6–5.1)
Alkaline phosphatase (APISO): 57 U/L (ref 35–144)
BUN/Creatinine Ratio: 6 (calc) (ref 6–22)
BUN: 6 mg/dL — ABNORMAL LOW (ref 7–25)
CO2: 25 mmol/L (ref 20–32)
Calcium: 9.7 mg/dL (ref 8.6–10.3)
Chloride: 104 mmol/L (ref 98–110)
Creat: 1.01 mg/dL (ref 0.70–1.25)
GFR, Est African American: 89 mL/min/{1.73_m2} (ref >=60)
GFR, Est Non African American: 77 mL/min/{1.73_m2} (ref >=60)
Globulin: 2.4 g/dL (calc) (ref 1.9–3.7)
Glucose, Bld: 80 mg/dL (ref 65–99)
Potassium: 3.8 mmol/L (ref 3.5–5.3)
Sodium: 139 mmol/L (ref 135–146)
Total Bilirubin: 0.6 mg/dL (ref 0.2–1.2)
Total Protein: 6.4 g/dL (ref 6.1–8.1)

## 2019-04-04 ENCOUNTER — Telehealth: Payer: Self-pay | Admitting: Family Medicine

## 2019-04-04 ENCOUNTER — Other Ambulatory Visit (INDEPENDENT_AMBULATORY_CARE_PROVIDER_SITE_OTHER): Payer: Medicare HMO

## 2019-04-04 DIAGNOSIS — R195 Other fecal abnormalities: Secondary | ICD-10-CM

## 2019-04-04 LAB — POC HEMOCCULT BLD/STL (HOME/3-CARD/SCREEN)
Card #2 Fecal Occult Blod, POC: NEGATIVE
Card #3 Fecal Occult Blood, POC: NEGATIVE
Fecal Occult Blood, POC: NEGATIVE

## 2019-04-04 NOTE — Telephone Encounter (Signed)
Stool card is in the lab for [facility] patient.

## 2019-04-05 NOTE — Telephone Encounter (Signed)
Done

## 2019-04-10 ENCOUNTER — Other Ambulatory Visit: Payer: Self-pay | Admitting: Emergency Medicine

## 2019-04-10 DIAGNOSIS — R55 Syncope and collapse: Secondary | ICD-10-CM | POA: Diagnosis not present

## 2019-04-10 DIAGNOSIS — R569 Unspecified convulsions: Secondary | ICD-10-CM | POA: Diagnosis not present

## 2019-04-10 DIAGNOSIS — E785 Hyperlipidemia, unspecified: Secondary | ICD-10-CM | POA: Diagnosis not present

## 2019-04-10 DIAGNOSIS — I10 Essential (primary) hypertension: Secondary | ICD-10-CM | POA: Diagnosis not present

## 2019-04-20 DIAGNOSIS — R569 Unspecified convulsions: Secondary | ICD-10-CM | POA: Diagnosis not present

## 2019-05-04 ENCOUNTER — Other Ambulatory Visit: Payer: Self-pay | Admitting: Family Medicine

## 2019-05-04 DIAGNOSIS — E7800 Pure hypercholesterolemia, unspecified: Secondary | ICD-10-CM

## 2019-05-12 ENCOUNTER — Encounter: Payer: Self-pay | Admitting: Family Medicine

## 2019-05-15 ENCOUNTER — Other Ambulatory Visit: Payer: Self-pay

## 2019-05-15 ENCOUNTER — Ambulatory Visit (INDEPENDENT_AMBULATORY_CARE_PROVIDER_SITE_OTHER): Payer: Medicare HMO

## 2019-05-15 DIAGNOSIS — Z23 Encounter for immunization: Secondary | ICD-10-CM

## 2019-05-17 ENCOUNTER — Telehealth: Payer: Self-pay

## 2019-05-17 NOTE — Telephone Encounter (Signed)
Copied from [place] [phone]. Topic: Referral - Status >> May 17, 2019  9:52 AM Reyne Dumas L wrote: Reason for CRM:   Kia from the sleep study center at [facility] calling.  States that they did get a referral and authorization for pt to have a sleep study.  Pt doesn't want to have that done.  Just FYI for office. Kia can be reached at [phone]

## 2019-05-31 ENCOUNTER — Other Ambulatory Visit: Payer: Self-pay | Admitting: Family Medicine

## 2019-05-31 DIAGNOSIS — I10 Essential (primary) hypertension: Secondary | ICD-10-CM

## 2019-05-31 DIAGNOSIS — Z0000 Encounter for general adult medical examination without abnormal findings: Secondary | ICD-10-CM

## 2019-06-06 ENCOUNTER — Other Ambulatory Visit: Payer: Self-pay

## 2019-06-06 ENCOUNTER — Ambulatory Visit (INDEPENDENT_AMBULATORY_CARE_PROVIDER_SITE_OTHER): Payer: Medicare HMO

## 2019-06-06 VITALS — BP 142/80 | HR 78 | Temp 97.3°F | Resp 16 | Ht 67.0 in | Wt 208.9 lb

## 2019-06-06 DIAGNOSIS — Z0000 Encounter for general adult medical examination without abnormal findings: Secondary | ICD-10-CM | POA: Diagnosis not present

## 2019-06-06 DIAGNOSIS — Z23 Encounter for immunization: Secondary | ICD-10-CM | POA: Diagnosis not present

## 2019-06-06 NOTE — Patient Instructions (Addendum)
Carlos Conley , Thank you for taking time to come for your Medicare Wellness Visit. I appreciate your ongoing commitment to your health goals. Please review the following plan we discussed and let me know if I can assist you in the future.   Screening recommendations/referrals: Colonoscopy: done 07/28/16. Repeat in 2027. Recommended yearly ophthalmology/optometry visit for glaucoma screening and checkup Recommended yearly dental visit for hygiene and checkup  Vaccinations: Influenza vaccine: done 05/15/19 Pneumococcal vaccine: done today Tdap vaccine: done 03/26/16 Shingles vaccine: Shingrix discussed. Please contact your pharmacy for coverage information.   Advanced directives: Advance directive discussed with you today. I have provided a copy for you to complete at home and have notarized. Once this is complete please bring a copy in to our office so we can scan it into your chart.  Conditions/risks identified: Keep up the great work!  Next appointment: Please follow up in one year for your Medicare Annual Wellness visit.    Preventive Care 54 Years and Older, Male Preventive care refers to lifestyle choices and visits with your health care provider that can promote health and wellness. What does preventive care include?  A yearly physical exam. This is also called an annual well check.  Dental exams once or twice a year.  Routine eye exams. Ask your health care provider how often you should have your eyes checked.  Personal lifestyle choices, including:  Daily care of your teeth and gums.  Regular physical activity.  Eating a healthy diet.  Avoiding tobacco and drug use.  Limiting alcohol use.  Practicing safe sex.  Taking low doses of aspirin every day.  Taking vitamin and mineral supplements as recommended by your health care provider. What happens during an annual well check? The services and screenings done by your health care provider during your annual well  check will depend on your age, overall health, lifestyle risk factors, and family history of disease. Counseling  Your health care provider may ask you questions about your:  Alcohol use.  Tobacco use.  Drug use.  Emotional well-being.  Home and relationship well-being.  Sexual activity.  Eating habits.  History of falls.  Memory and ability to understand (cognition).  Work and work Statistician. Screening  You may have the following tests or measurements:  Height, weight, and BMI.  Blood pressure.  Lipid and cholesterol levels. These may be checked every 5 years, or more frequently if you are over 51 years old.  Skin check.  Lung cancer screening. You may have this screening every year starting at age 49 if you have a 30-pack-year history of smoking and currently smoke or have quit within the past 15 years.  Fecal occult blood test (FOBT) of the stool. You may have this test every year starting at age 43.  Flexible sigmoidoscopy or colonoscopy. You may have a sigmoidoscopy every 5 years or a colonoscopy every 10 years starting at age 39.  Prostate cancer screening. Recommendations will vary depending on your family history and other risks.  Hepatitis C blood test.  Hepatitis B blood test.  Sexually transmitted disease (STD) testing.  Diabetes screening. This is done by checking your blood sugar (glucose) after you have not eaten for a while (fasting). You may have this done every 1-3 years.  Abdominal aortic aneurysm (AAA) screening. You may need this if you are a current or former smoker.  Osteoporosis. You may be screened starting at age 66 if you are at high risk. Talk with your health care provider  about your test results, treatment options, and if necessary, the need for more tests. Vaccines  Your health care provider may recommend certain vaccines, such as:  Influenza vaccine. This is recommended every year.  Tetanus, diphtheria, and acellular  pertussis (Tdap, Td) vaccine. You may need a Td booster every 10 years.  Zoster vaccine. You may need this after age 18.  Pneumococcal 13-valent conjugate (PCV13) vaccine. One dose is recommended after age 64.  Pneumococcal polysaccharide (PPSV23) vaccine. One dose is recommended after age 66. Talk to your health care provider about which screenings and vaccines you need and how often you need them. This information is not intended to replace advice given to you by your health care provider. Make sure you discuss any questions you have with your health care provider. Document Released: 09/06/2015 Document Revised: 04/29/2016 Document Reviewed: 06/11/2015 Elsevier Interactive Patient Education  2017 [place] Prevention in the Home Falls can cause injuries. They can happen to people of all ages. There are many things you can do to make your home safe and to help prevent falls. What can I do on the outside of my home?  Regularly fix the edges of walkways and driveways and fix any cracks.  Remove anything that might make you trip as you walk through a door, such as a raised step or threshold.  Trim any bushes or trees on the path to your home.  Use bright outdoor lighting.  Clear any walking paths of anything that might make someone trip, such as rocks or tools.  Regularly check to see if handrails are loose or broken. Make sure that both sides of any steps have handrails.  Any raised decks and porches should have guardrails on the edges.  Have any leaves, snow, or ice cleared regularly.  Use sand or salt on walking paths during winter.  Clean up any spills in your garage right away. This includes oil or grease spills. What can I do in the bathroom?  Use night lights.  Install grab bars by the toilet and in the tub and shower. Do not use towel bars as grab bars.  Use non-skid mats or decals in the tub or shower.  If you need to sit down in the shower, use a plastic,  non-slip stool.  Keep the floor dry. Clean up any water that spills on the floor as soon as it happens.  Remove soap buildup in the tub or shower regularly.  Attach bath mats securely with double-sided non-slip rug tape.  Do not have throw rugs and other things on the floor that can make you trip. What can I do in the bedroom?  Use night lights.  Make sure that you have a light by your bed that is easy to reach.  Do not use any sheets or blankets that are too big for your bed. They should not hang down onto the floor.  Have a firm chair that has side arms. You can use this for support while you get dressed.  Do not have throw rugs and other things on the floor that can make you trip. What can I do in the kitchen?  Clean up any spills right away.  Avoid walking on wet floors.  Keep items that you use a lot in easy-to-reach places.  If you need to reach something above you, use a strong step stool that has a grab bar.  Keep electrical cords out of the way.  Do not use floor polish or  wax that makes floors slippery. If you must use wax, use non-skid floor wax.  Do not have throw rugs and other things on the floor that can make you trip. What can I do with my stairs?  Do not leave any items on the stairs.  Make sure that there are handrails on both sides of the stairs and use them. Fix handrails that are broken or loose. Make sure that handrails are as long as the stairways.  Check any carpeting to make sure that it is firmly attached to the stairs. Fix any carpet that is loose or worn.  Avoid having throw rugs at the top or bottom of the stairs. If you do have throw rugs, attach them to the floor with carpet tape.  Make sure that you have a light switch at the top of the stairs and the bottom of the stairs. If you do not have them, ask someone to add them for you. What else can I do to help prevent falls?  Wear shoes that:  Do not have high heels.  Have rubber  bottoms.  Are comfortable and fit you well.  Are closed at the toe. Do not wear sandals.  If you use a stepladder:  Make sure that it is fully opened. Do not climb a closed stepladder.  Make sure that both sides of the stepladder are locked into place.  Ask someone to hold it for you, if possible.  Clearly mark and make sure that you can see:  Any grab bars or handrails.  First and last steps.  Where the edge of each step is.  Use tools that help you move around (mobility aids) if they are needed. These include:  Canes.  Walkers.  Scooters.  Crutches.  Turn on the lights when you go into a dark area. Replace any light bulbs as soon as they burn out.  Set up your furniture so you have a clear path. Avoid moving your furniture around.  If any of your floors are uneven, fix them.  If there are any pets around you, be aware of where they are.  Review your medicines with your doctor. Some medicines can make you feel dizzy. This can increase your chance of falling. Ask your doctor what other things that you can do to help prevent falls. This information is not intended to replace advice given to you by your health care provider. Make sure you discuss any questions you have with your health care provider. Document Released: 06/06/2009 Document Revised: 01/16/2016 Document Reviewed: 09/14/2014 Elsevier Interactive Patient Education  2017 [employer]  Pneumococcal Polysaccharide Vaccine (PPSV23): What You Need to Know 1. Why get vaccinated? Pneumococcal polysaccharide vaccine (PPSV23) can prevent pneumococcal disease. Pneumococcal disease refers to any illness caused by pneumococcal bacteria. These bacteria can cause many types of illnesses, including pneumonia, which is an infection of the lungs. Pneumococcal bacteria are one of the most common causes of pneumonia. Besides pneumonia, pneumococcal bacteria can also cause:  Ear infections  Sinus infections  Meningitis  (infection of the tissue covering the brain and spinal cord)  Bacteremia (bloodstream infection) Anyone can get pneumococcal disease, but children under 25 years of age, people with certain medical conditions, adults 54 years or older, and cigarette smokers are at the highest risk. Most pneumococcal infections are mild. However, some can result in long-term problems, such as brain damage or hearing loss. Meningitis, bacteremia, and pneumonia caused by pneumococcal disease can be fatal. 2. PPSV23 PPSV23 protects against 23  types of bacteria that cause pneumococcal disease. PPSV23 is recommended for:  All adults 42 years or older,  Anyone 2 years or older with certain medical conditions that can lead to an increased risk for pneumococcal disease. Most people need only one dose of PPSV23. A second dose of PPSV23, and another type of pneumococcal vaccine called PCV13, are recommended for certain high-risk groups. Your health care provider can give you more information. People 65 years or older should get a dose of PPSV23 even if they have already gotten one or more doses of the vaccine before they turned 69. 3. Talk with your health care provider Tell your vaccine provider if the person getting the vaccine:  Has had an allergic reaction after a previous dose of PPSV23, or has any severe, life-threatening allergies. In some cases, your health care provider may decide to postpone PPSV23 vaccination to a future visit. People with minor illnesses, such as a cold, may be vaccinated. People who are moderately or severely ill should usually wait until they recover before getting PPSV23. Your health care provider can give you more information. 4. Risks of a vaccine reaction  Redness or pain where the shot is given, feeling tired, fever, or muscle aches can happen after PPSV23. People sometimes faint after medical procedures, including vaccination. Tell your provider if you feel dizzy or have vision  changes or ringing in the ears. As with any medicine, there is a very remote chance of a vaccine causing a severe allergic reaction, other serious injury, or death. 5. What if there is a serious problem? An allergic reaction could occur after the vaccinated person leaves the clinic. If you see signs of a severe allergic reaction (hives, swelling of the face and throat, difficulty breathing, a fast heartbeat, dizziness, or weakness), call 9-1-1 and get the person to the nearest hospital. For other signs that concern you, call your health care provider. Adverse reactions should be reported to the Vaccine Adverse Event Reporting System (VAERS). Your health care provider will usually file this report, or you can do it yourself. Visit the VAERS website at www.vaers.SamedayNews.es or call (724)399-9267. VAERS is only for reporting reactions, and VAERS staff do not give medical advice. 6. How can I learn more?  Ask your health care provider.  Call your local or state health department.  Contact the Centers for Disease Control and Prevention (CDC): ? Call 678-375-5062 (1-800-CDC-INFO) or ? Visit CDC's website at http://hunter.com/ CDC Vaccine Information Statement PPSV23 Vaccine (06/22/2018) This information is not intended to replace advice given to you by your health care provider. Make sure you discuss any questions you have with your health care provider. Document Released: 06/07/2006 Document Revised: 11/29/2018 Document Reviewed: 03/22/2018 Elsevier Patient Education  2020 [employer].

## 2019-06-06 NOTE — Progress Notes (Signed)
Subjective:   Carlos Conley is a 68 y.o. male who presents for an Initial Medicare Annual Wellness Visit.  Review of Systems   Cardiac Risk Factors include: advanced age (>16men, >39 women);dyslipidemia;hypertension;male gender;obesity (BMI >30kg/m2)    Objective:    Today's Vitals   06/06/19 0856  BP: (!) 142/80  Pulse: 78  Resp: 16  Temp: (!) 97.3 F (36.3 C)  TempSrc: Temporal  SpO2: 98%  Weight: 208 lb 14.4 oz (94.8 kg)  Height: 5\' 7"  (1.702 m)   Body mass index is 32.72 kg/m.  Advanced Directives 06/06/2019 03/18/2019 11/07/2018 10/25/2017 05/27/2017 05/12/2017 02/16/2017  Does Patient Have a Medical Advance Directive? No No No No No No No  Would patient like information on creating a medical advance directive? Yes (MAU/Ambulatory/Procedural Areas - Information given) No - Patient declined No - Patient declined No - Patient declined No - Patient declined No - Patient declined -    Current Medications (verified) Outpatient Encounter Medications as of 06/06/2019  Medication Sig  . acetaminophen (TYLENOL) 500 MG tablet Take 1 tablet (500 mg total) by mouth every 6 (six) hours as needed.  Marland Kitchen amLODipine (NORVASC) 5 MG tablet Take 1 tablet (5 mg total) by mouth daily.  Marland Kitchen ezetimibe (ZETIA) 10 MG tablet TAKE 1 TABLET BY MOUTH EVERY DAY  . sildenafil (VIAGRA) 100 MG tablet Take 1 tablet (100 mg total) by mouth daily as needed for erectile dysfunction.  . valsartan-hydrochlorothiazide (DIOVAN-HCT) 320-25 MG tablet TAKE 1 TABLET BY MOUTH EVERY DAY  . [DISCONTINUED] aspirin EC 81 MG tablet Take 1 tablet (81 mg total) by mouth daily. (Patient not taking: Reported on 06/06/2019)   No facility-administered encounter medications on file as of 06/06/2019.     Allergies (verified) Lipitor [atorvastatin], Pravastatin, and Simvastatin   History: Past Medical History:  Diagnosis Date  . Arthritis   . Cancer [facility])    Prostate  . Hyperlipidemia   . Hypertension    Past Surgical  History:  Procedure Laterality Date  . COLONOSCOPY WITH PROPOFOL N/A 07/28/2016   Procedure: COLONOSCOPY WITH PROPOFOL;  Surgeon: Lucilla Lame, MD;  Location: [facility] ENDOSCOPY;  Service: Endoscopy;  Laterality: N/A;  . PROSTATE SURGERY  2002  . PROSTATE SURGERY  2002  . TOTAL HIP ARTHROPLASTY Right 05/27/2017   Procedure: TOTAL HIP ARTHROPLASTY ANTERIOR APPROACH;  Surgeon: Hessie Knows, MD;  Location: [facility] ORS;  Service: Orthopedics;  Laterality: Right;   Family History  Problem Relation Age of Onset  . Cancer Father   . Leukemia Father   . Hypertension Sister   . Hypertension Brother   . Hypertension Sister   . Hypertension Sister    Social History   Socioeconomic History  . Marital status: Married    Spouse name: [name]  . Number of children: 3  . Years of education: Not on file  . Highest education level: Some college, no degree  Occupational History  . Occupation: Retired    Comment: Cytogeneticist at a plant   Social Needs  . Financial resource strain: Not hard at all  . Food insecurity    Worry: Never true    Inability: Never true  . Transportation needs    Medical: No    Non-medical: No  Tobacco Use  . Smoking status: Never Smoker  . Smokeless tobacco: Never Used  Substance and Sexual Activity  . Alcohol use: Yes    Alcohol/week: 2.0 - 3.0 standard drinks    Types: 2 - 3 Cans of beer  per week    Comment: daily  . Drug use: No  . Sexual activity: Yes    Partners: Female  Lifestyle  . Physical activity    Days per week: 5 days    Minutes per session: 10 min  . Stress: Not at all  Relationships  . Social connections    Talks on phone: More than three times a week    Gets together: More than three times a week    Attends religious service: More than 4 times per year    Active member of club or organization: Yes    Attends meetings of clubs or organizations: More than 4 times per year    Relationship status: Married  Other Topics Concern  . Not on file   Social History Narrative   Retired April of 2016      Does not eat meat except for occasional fish   Tobacco Counseling Counseling given: Not Answered   Clinical Intake:  Pre-visit preparation completed: Yes  Pain : No/denies pain     BMI - recorded: 32.72 Nutritional Status: BMI > 30  Obese Nutritional Risks: None Diabetes: No  How often do you need to have someone help you when you read instructions, pamphlets, or other written materials from your doctor or pharmacy?: 1 - Never  Interpreter Needed?: No  Information entered by :: [name] LPN  Activities of Daily Living In your present state of health, do you have any difficulty performing the following activities: 06/06/2019 12/29/2018  Hearing? N N  Comment declines hearing aids -  Vision? N N  Comment - -  Difficulty concentrating or making decisions? N N  Walking or climbing stairs? N N  Dressing or bathing? N N  Doing errands, shopping? N N  Preparing Food and eating ? N -  Using the Toilet? N -  In the past six months, have you accidently leaked urine? N -  Do you have problems with loss of bowel control? N -  Managing your Medications? N -  Managing your Finances? N -  Housekeeping or managing your Housekeeping? N -  Some recent data might be hidden     Immunizations and Health Maintenance Immunization History  Administered Date(s) Administered  . Fluad Quad(high Dose 65+) 05/15/2019  . Influenza, High Dose Seasonal PF 05/24/2018  . Influenza,inj,Quad PF,6+ Mos 06/19/2015, 06/02/2016, 07/09/2017  . Influenza-Unspecified 06/04/2013  . Pneumococcal Conjugate-13 05/24/2018  . Pneumococcal Polysaccharide-23 06/06/2019  . Tdap 03/26/2016  . Zoster 03/26/2016   Health Maintenance Due  Topic Date Due  . PNA vac Low Risk Adult (2 of 2 - PPSV23) 05/25/2019    Patient Care Team: Steele Sizer, MD as PCP - General (Family Medicine) Bary Castilla, Forest Gleason, MD (General Surgery) Noreene Filbert, MD as  Referring Physician (Radiation Oncology) Hessie Knows, MD as Consulting Physician (Orthopedic Surgery)  Indicate any recent Medical Services you may have received from other than [facility] providers in the past year (date may be approximate).    Assessment:   This is a routine wellness examination for Carlos Conley.  Hearing/Vision screen  Hearing Screening   125Hz  250Hz  500Hz  1000Hz  2000Hz  3000Hz  4000Hz  6000Hz  8000Hz   Right ear:           Left ear:           Comments: Pt denies hearing difficulty   Vision Screening Comments: Pt is past due for eye exam, plans to contact insurance for preferred provider  Dietary issues and exercise activities discussed: Current  Exercise Habits: Home exercise routine, Type of exercise: walking, Time (Minutes): 10, Frequency (Times/Week): 5, Weekly Exercise (Minutes/Week): 50, Intensity: Mild, Exercise limited by: None identified  Goals   None    Depression Screen PHQ 2/9 Scores 06/06/2019 03/24/2019 12/29/2018 11/07/2018  PHQ - 2 Score 0 0 0 0  PHQ- 9 Score - 0 0 0    Fall Risk Fall Risk  06/06/2019 03/24/2019 12/29/2018 11/07/2018 08/31/2018  Falls in the past year? 0 0 0 0 0  Number falls in past yr: 0 0 0 0 0  Injury with Fall? 0 0 0 0 0  Follow up Falls prevention discussed Falls evaluation completed - - -    FALL RISK PREVENTION PERTAINING TO THE HOME:  Any stairs in or around the home? Yes  If so, do they handrails? Yes   Home free of loose throw rugs in walkways, pet beds, electrical cords, etc? Yes  Adequate lighting in your home to reduce risk of falls? Yes   ASSISTIVE DEVICES UTILIZED TO PREVENT FALLS:  Life alert? No  Use of a cane, walker or w/c? No  Grab bars in the bathroom? No  Shower chair or bench in shower? No  Elevated toilet seat or a handicapped toilet? No   DME ORDERS:  DME order needed?  No   TIMED UP AND GO:  Was the test performed? Yes .  Length of time to ambulate 10 feet: 5 sec.   GAIT:  Appearance of gait: Gait  stead-fast and without the use of an assistive device.  Education: Fall risk prevention has been discussed.  Intervention(s) required? No   Cognitive Function:     6CIT Screen 06/06/2019  What Year? 0 points  What month? 0 points  What time? 0 points  Count back from 20 0 points  Months in reverse 0 points  Repeat phrase 4 points  Total Score 4    Screening Tests Health Maintenance  Topic Date Due  . PNA vac Low Risk Adult (2 of 2 - PPSV23) 05/25/2019  . TETANUS/TDAP  03/26/2026  . COLONOSCOPY  07/28/2026  . INFLUENZA VACCINE  Completed  . Hepatitis C Screening  Completed    Qualifies for Shingles Vaccine? Yes  Zostavax completed 2017. Due for Shingrix. Education has been provided regarding the importance of this vaccine. Pt has been advised to call insurance company to determine out of pocket expense. Advised may also receive vaccine at local pharmacy or Health Dept. Verbalized acceptance and understanding.  Tdap: Up to date  Flu Vaccine: Up to date  Pneumococcal Vaccine: Due for Pneumococcal vaccine. Does the patient want to receive this vaccine today?  Yes . Education has been provided regarding the importance of this vaccine but still declined. Advised may receive this vaccine at local pharmacy or Health Dept. Aware to provide a copy of the vaccination record if obtained from local pharmacy or Health Dept. Verbalized acceptance and understanding.  Cancer Screenings:  Colorectal Screening: Completed 07/28/16. Repeat every 10 years;   Lung Cancer Screening: (Low Dose CT Chest recommended if Age 93-80 years, 30 pack-year currently smoking OR have quit w/in 15years.) does not qualify.    Additional Screening:  Hepatitis C Screening: does qualify; Completed 06/02/18  Vision Screening: Recommended annual ophthalmology exams for early detection of glaucoma and other disorders of the eye. Is the patient up to date with their annual eye exam?  No  Who is the provider or what  is the name of the office in which  the pt attends annual eye exams? Not established If pt is not established with a provider, would they like to be referred to a provider to establish care? No .   Dental Screening: Recommended annual dental exams for proper oral hygiene  Community Resource Referral:  CRR required this visit?  No       Plan:    I have personally reviewed and addressed the Medicare Annual Wellness questionnaire and have noted the following in the patient's chart:  A. Medical and social history B. Use of alcohol, tobacco or illicit drugs  C. Current medications and supplements D. Functional ability and status E.  Nutritional status F.  Physical activity G. Advance directives H. List of other physicians I.  Hospitalizations, surgeries, and ER visits in previous 12 months J.  [place] such as hearing and vision if needed, cognitive and depression L. Referrals and appointments   In addition, I have reviewed and discussed with patient certain preventive protocols, quality metrics, and best practice recommendations. A written personalized care plan for preventive services as well as general preventive health recommendations were provided to patient.   Signed,  [name], LPN Nurse Health Advisor   Nurse Notes: pt doing well and appreciative of visit today

## 2019-06-14 ENCOUNTER — Other Ambulatory Visit: Payer: Self-pay | Admitting: Family Medicine

## 2019-06-14 DIAGNOSIS — I10 Essential (primary) hypertension: Secondary | ICD-10-CM

## 2019-07-03 ENCOUNTER — Other Ambulatory Visit: Payer: Self-pay

## 2019-07-03 ENCOUNTER — Ambulatory Visit (INDEPENDENT_AMBULATORY_CARE_PROVIDER_SITE_OTHER): Payer: Medicare HMO | Admitting: Family Medicine

## 2019-07-03 ENCOUNTER — Telehealth: Payer: Self-pay

## 2019-07-03 ENCOUNTER — Encounter: Payer: Self-pay | Admitting: Family Medicine

## 2019-07-03 VITALS — BP 185/110 | HR 80 | Temp 96.6°F | Resp 16 | Ht 67.0 in | Wt 210.2 lb

## 2019-07-03 DIAGNOSIS — N5231 Erectile dysfunction following radical prostatectomy: Secondary | ICD-10-CM | POA: Diagnosis not present

## 2019-07-03 DIAGNOSIS — M79601 Pain in right arm: Secondary | ICD-10-CM

## 2019-07-03 DIAGNOSIS — Z8546 Personal history of malignant neoplasm of prostate: Secondary | ICD-10-CM | POA: Diagnosis not present

## 2019-07-03 DIAGNOSIS — I10 Essential (primary) hypertension: Secondary | ICD-10-CM

## 2019-07-03 DIAGNOSIS — R569 Unspecified convulsions: Secondary | ICD-10-CM | POA: Diagnosis not present

## 2019-07-03 DIAGNOSIS — E7800 Pure hypercholesterolemia, unspecified: Secondary | ICD-10-CM | POA: Diagnosis not present

## 2019-07-03 DIAGNOSIS — Z789 Other specified health status: Secondary | ICD-10-CM | POA: Diagnosis not present

## 2019-07-03 MED ORDER — AMLODIPINE BESYLATE 10 MG PO TABS: 5.0000 mg | ORAL_TABLET | Freq: Every day | ORAL | 0 refills | Status: DC

## 2019-07-03 MED ORDER — VALSARTAN-HYDROCHLOROTHIAZIDE 320-25 MG PO TABS: 1.0000 | ORAL_TABLET | Freq: Every day | ORAL | 0 refills | Status: DC

## 2019-07-03 MED ORDER — SILDENAFIL CITRATE 100 MG PO TABS: 100.0000 mg | ORAL_TABLET | Freq: Every day | ORAL | 0 refills | Status: DC | PRN

## 2019-07-03 MED ORDER — EZETIMIBE 10 MG PO TABS: 10.0000 mg | ORAL_TABLET | Freq: Every day | ORAL | 0 refills | Status: DC

## 2019-07-03 NOTE — Patient Instructions (Signed)
Please take a total of 10 mg ( Two pills of the amlodipine 5 mg  tablets you currently have at home)

## 2019-07-03 NOTE — Telephone Encounter (Signed)
Copied from [place] [id number]. Topic: General - Other >> Jul 03, 2019 11:56 AM Rayann Heman wrote: Reason for CRM: pt called and stated that he was told to call and give the name or the urologist he went to. Dr. Phillips Odor  I dont see any recent referrals so I am not sure who requested this information.  I will forward it to his PCP and her CMAs.

## 2019-07-03 NOTE — Progress Notes (Addendum)
Name: Carlos Conley   MRN: [id number]    DOB: Apr 29, 1951   Date:07/03/2019       Progress Note  Subjective  Chief Complaint  Chief Complaint  Patient presents with  . Hypertension  . Hyperglycemia  . Hyperlipidemia    HPI  Hyperlipidemia: last time ASCVD was very high we started him on statin but caused muscle pain, he is now on zetia and denies side effects, we will recheck labs today   HTN:he was taking Azor but states too expensive since change in insurance, we changed to  Norvasc 5 mg and Diovan 320/25 and bp at home has been in the 140's range, also on his last visit here. Today in our office bp very high, we will adjust dose of norvasc 10 mg . He states not stressed. Checked bp twiced  History of prostate cancer: doing well, seeing Dr. Donella Stade yearly now  Seizures: he states childhood history of seizures, started started before he started school, one episode in middle school. He states usually triggered by diet. He had some syncopal episodes in July, one of the episodes he bite his tongue and wife took him to [facility], he followed up with a neurologist in [place] and anti-seizure medication was stopped. We will try to obtain copy of his out patient records.   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 No side effects of medications  Right arm pain : he has been chopping wood, racking leaves and noticed some right arm pain, described as soreness, no pain at this time, normal rom of motion at this time, using topical medication and doing well   Patient Active Problem List   Diagnosis Date Noted  . History of total right hip replacement 08/11/2017  . Reflux esophagitis   . Benign neoplasm of transverse colon   . Hyperlipidemia 01/02/2016  . Essential hypertension 01/02/2016  . Hyperglycemia 01/02/2016  . ED (erectile dysfunction) of organic origin 02/16/2013  . Prostate cancer ([employer]) 06/14/2012    Past Surgical History:   Procedure Laterality Date  . COLONOSCOPY WITH PROPOFOL N/A 07/28/2016   Procedure: COLONOSCOPY WITH PROPOFOL;  Surgeon: Lucilla Lame, MD;  Location: [facility] ENDOSCOPY;  Service: Endoscopy;  Laterality: N/A;  . PROSTATE SURGERY  2002  . PROSTATE SURGERY  2002  . TOTAL HIP ARTHROPLASTY Right 05/27/2017   Procedure: TOTAL HIP ARTHROPLASTY ANTERIOR APPROACH;  Surgeon: Hessie Knows, MD;  Location: [facility] ORS;  Service: Orthopedics;  Laterality: Right;    Family History  Problem Relation Age of Onset  . Cancer Father   . Leukemia Father   . Hypertension Sister   . Hypertension Brother   . Hypertension Sister   . Hypertension Sister     Social History   Socioeconomic History  . Marital status: Married    Spouse name: [name]  . Number of children: 3  . Years of education: Not on file  . Highest education level: Some college, no degree  Occupational History  . Occupation: Retired    Comment: Cytogeneticist at a plant   Social Needs  . Financial resource strain: Not hard at all  . Food insecurity    Worry: Never true    Inability: Never true  . Transportation needs    Medical: No    Non-medical: No  Tobacco Use  . Smoking status: Never Smoker  . Smokeless tobacco: Never Used  Substance and Sexual Activity  . Alcohol use: Yes    Alcohol/week:  2.0 - 3.0 standard drinks    Types: 2 - 3 Cans of beer per week    Comment: daily  . Drug use: No  . Sexual activity: Yes    Partners: Female  Lifestyle  . Physical activity    Days per week: 5 days    Minutes per session: 10 min  . Stress: Not at all  Relationships  . Social connections    Talks on phone: More than three times a week    Gets together: More than three times a week    Attends religious service: More than 4 times per year    Active member of club or organization: Yes    Attends meetings of clubs or organizations: More than 4 times per year    Relationship status: Married  . Intimate partner violence    Fear of  current or ex partner: No    Emotionally abused: No    Physically abused: No    Forced sexual activity: No  Other Topics Concern  . Not on file  Social History Narrative   Retired April of 2016      Does not eat meat except for occasional fish     Current Outpatient Medications:  .  acetaminophen (TYLENOL) 500 MG tablet, Take 1 tablet (500 mg total) by mouth every 6 (six) hours as needed., Disp: 30 tablet, Rfl: 0 .  amLODipine (NORVASC) 10 MG tablet, Take 0.5 tablets (5 mg total) by mouth daily., Disp: 90 tablet, Rfl: 0 .  ezetimibe (ZETIA) 10 MG tablet, Take 1 tablet (10 mg total) by mouth daily., Disp: 90 tablet, Rfl: 0 .  sildenafil (VIAGRA) 100 MG tablet, Take 1 tablet (100 mg total) by mouth daily as needed for erectile dysfunction., Disp: 30 tablet, Rfl: 0 .  valsartan-hydrochlorothiazide (DIOVAN-HCT) 320-25 MG tablet, Take 1 tablet by mouth daily., Disp: 90 tablet, Rfl: 0  Allergies  Allergen Reactions  . Lipitor [Atorvastatin] Rash  . Pravastatin Itching, Rash and Other (See Comments)    Redness from feet up to knees.  . Simvastatin Itching and Rash    I personally reviewed active problem list, medication list, allergies, family history, social history, health maintenance with the patient/caregiver today.   ROS  Constitutional: Negative for fever or weight change.  Respiratory: Negative for cough and shortness of breath.   Cardiovascular: Negative for chest pain or palpitations.  Gastrointestinal: Negative for abdominal pain, no bowel changes.  Musculoskeletal: Negative for gait problem or joint swelling.  Skin: Negative for rash.  Neurological: Negative for dizziness or headache.  No other specific complaints in a complete review of systems (except as listed in HPI above).   Objective  Vitals:   07/03/19 0752 07/03/19 0812  BP: (!) 170/100 (!) 185/110  Pulse: 80   Resp: 16   Temp: (!) 96.6 F (35.9 C)   TempSrc: Temporal   SpO2: 95%   Weight: 210 lb 3.2  oz (95.3 kg)   Height: 5\' 7"  (1.702 m)     Body mass index is 32.92 kg/m.  Physical Exam  Constitutional: Patient appears well-developed and well-nourished. Obese No distress.  HEENT: head atraumatic, normocephalic, pupils equal and reactive to light Cardiovascular: Normal rate, regular rhythm and normal heart sounds.  No murmur heard. No BLE edema. Pulmonary/Chest: Effort normal and breath sounds normal. No respiratory distress. Abdominal: Soft.  There is no tenderness. Psychiatric: Patient has a normal mood and affect. behavior is normal. Judgment and thought content normal. Neurological: no focal findings  Recent Results (from the past 2160 hour(s))  POC Hemoccult Bld/Stl (3-Cd Home Screen)     Status: Normal   Collection Time: 04/04/19  4:39 PM  Result Value Ref Range   Card #1 Date 04/02/19    Fecal Occult Blood, POC Negative Negative   Card #2 Date 04/03/19    Card #2 Fecal Occult Blod, POC Negative    Card #3 Date 04/04/19    Card #3 Fecal Occult Blood, POC Negative       PHQ2/9: Depression screen [facility] 2/9 07/03/2019 06/06/2019 03/24/2019 12/29/2018 11/07/2018  Decreased Interest 0 0 0 0 0  Down, Depressed, Hopeless 0 0 0 0 0  PHQ - 2 Score 0 0 0 0 0  Altered sleeping 0 - 0 0 0  Tired, decreased energy 0 - 0 0 0  Change in appetite 0 - 0 0 0  Feeling bad or failure about yourself  0 - 0 0 0  Trouble concentrating 0 - 0 0 0  Moving slowly or fidgety/restless 0 - 0 0 0  Suicidal thoughts 0 - 0 0 0  PHQ-9 Score 0 - 0 0 0  Difficult doing work/chores - - Not difficult at all Not difficult at all Not difficult at all    phq 9 is negative   Fall Risk: Fall Risk  07/03/2019 06/06/2019 03/24/2019 12/29/2018 11/07/2018  Falls in the past year? 0 0 0 0 0  Number falls in past yr: 0 0 0 0 0  Injury with Fall? 0 0 0 0 0  Follow up - Falls prevention discussed Falls evaluation completed - -     Functional Status Survey: Is the patient deaf or have difficulty hearing?:  No Does the patient have difficulty seeing, even when wearing glasses/contacts?: No Does the patient have difficulty concentrating, remembering, or making decisions?: No Does the patient have difficulty walking or climbing stairs?: No Does the patient have difficulty dressing or bathing?: No Does the patient have difficulty doing errands alone such as visiting a doctor's office or shopping?: No    Assessment & Plan  1. Erectile dysfunction following radical prostatectomy  - sildenafil (VIAGRA) 100 MG tablet; Take 1 tablet (100 mg total) by mouth daily as needed for erectile dysfunction.  Dispense: 30 tablet; Refill: 0  2. Pure hypercholesterolemia  - Lipid panel - ezetimibe (ZETIA) 10 MG tablet; Take 1 tablet (10 mg total) by mouth daily.  Dispense: 90 tablet; Refill: 0  3. Essential hypertension  - valsartan-hydrochlorothiazide (DIOVAN-HCT) 320-25 MG tablet; Take 1 tablet by mouth daily.  Dispense: 90 tablet; Refill: 0 - amLODipine (NORVASC) 10 MG tablet; Take 0.5 tablets (5 mg total) by mouth daily.  Dispense: 90 tablet; Refill: 0 - Urine Microalbumin w/creat. ratio - COMPLETE METABOLIC PANEL WITH GFR - TSH  4. Statin intolerance   5. History of prostate cancer  Sees Dr. Baruch Gouty once a year  6. Seizures ([place])  We will get records from neurologist   7. Right arm pain  Seems to be related to activity, advised topical medication and massage for now

## 2019-07-04 LAB — COMPLETE METABOLIC PANEL WITH GFR
AG Ratio: 1.4 (calc) (ref 1.0–2.5)
ALT: 20 U/L (ref 9–46)
AST: 34 U/L (ref 10–35)
Albumin: 4.2 g/dL (ref 3.6–5.1)
Alkaline phosphatase (APISO): 70 U/L (ref 35–144)
BUN/Creatinine Ratio: 3 (calc) — ABNORMAL LOW (ref 6–22)
BUN: 3 mg/dL — ABNORMAL LOW (ref 7–25)
CO2: 33 mmol/L — ABNORMAL HIGH (ref 20–32)
Calcium: 9.8 mg/dL (ref 8.6–10.3)
Chloride: 98 mmol/L (ref 98–110)
Creat: 0.98 mg/dL (ref 0.70–1.25)
GFR, Est African American: 93 mL/min/{1.73_m2} (ref >=60)
GFR, Est Non African American: 80 mL/min/{1.73_m2} (ref >=60)
Globulin: 2.9 g/dL (calc) (ref 1.9–3.7)
Glucose, Bld: 102 mg/dL — ABNORMAL HIGH (ref 65–99)
Potassium: 4.8 mmol/L (ref 3.5–5.3)
Sodium: 137 mmol/L (ref 135–146)
Total Bilirubin: 0.6 mg/dL (ref 0.2–1.2)
Total Protein: 7.1 g/dL (ref 6.1–8.1)

## 2019-07-04 LAB — LIPID PANEL
Cholesterol: 218 mg/dL — ABNORMAL HIGH (ref <200)
HDL: 54 mg/dL (ref >=40)
LDL Cholesterol (Calc): 143 mg/dL (calc) — ABNORMAL HIGH
Non-HDL Cholesterol (Calc): 164 mg/dL (calc) — ABNORMAL HIGH (ref <130)
Total CHOL/HDL Ratio: 4 (calc) (ref <5.0)
Triglycerides: 97 mg/dL (ref <150)

## 2019-07-04 LAB — MICROALBUMIN / CREATININE URINE RATIO
Creatinine, Urine: 37 mg/dL (ref 20–320)
Microalb Creat Ratio: 5 mcg/mg creat (ref <30)
Microalb, Ur: 0.2 mg/dL

## 2019-07-04 LAB — TSH: TSH: 1.23 mIU/L (ref 0.40–4.50)

## 2019-07-25 ENCOUNTER — Ambulatory Visit (INDEPENDENT_AMBULATORY_CARE_PROVIDER_SITE_OTHER): Payer: Medicare HMO | Admitting: Family Medicine

## 2019-07-25 ENCOUNTER — Other Ambulatory Visit: Payer: Self-pay

## 2019-07-25 ENCOUNTER — Encounter: Payer: Self-pay | Admitting: Family Medicine

## 2019-07-25 VITALS — BP 150/90 | HR 93 | Temp 97.7°F | Resp 16 | Ht 67.0 in | Wt 213.0 lb

## 2019-07-25 DIAGNOSIS — K625 Hemorrhage of anus and rectum: Secondary | ICD-10-CM | POA: Diagnosis not present

## 2019-07-25 DIAGNOSIS — R319 Hematuria, unspecified: Secondary | ICD-10-CM | POA: Diagnosis not present

## 2019-07-25 DIAGNOSIS — I10 Essential (primary) hypertension: Secondary | ICD-10-CM | POA: Diagnosis not present

## 2019-07-25 DIAGNOSIS — Z8546 Personal history of malignant neoplasm of prostate: Secondary | ICD-10-CM

## 2019-07-25 LAB — HEMOCCULT GUIAC POC 1CARD (OFFICE): Fecal Occult Blood, POC: NEGATIVE

## 2019-07-25 MED ORDER — AMLODIPINE BESYLATE 10 MG PO TABS: 10.0000 mg | ORAL_TABLET | Freq: Every day | ORAL | 0 refills | Status: DC

## 2019-07-25 NOTE — Progress Notes (Signed)
Name: Carlos Conley   MRN: [id number]    DOB: October 05, 1950   Date:07/25/2019       Progress Note  Subjective  Chief Complaint  Chief Complaint  Patient presents with  . Hematuria  . Rectal Bleeding    HPI  Hematuria: he has noticed bright blood after he voids intermittently for the past month, but worse since yesterday, every time he voided he noticed blood afterwards. He states yesterday morning it was a lot. No dysuria, back pain, fever, chills or rectal pain. He does not have a history of kidney stone, he has a history of prostate cancer and radiation had radiation therapy in 2016. S/ p Prostatectomy in 2003   Rectal bleeding: second episode yesterday. He states he noticed blood when he wipes about one month ago, no change in bowel movements, no straining. Yesterday he noticed blood when he wipes again yesterday am, but nothing more since. No weight loss or change in appetite. Hemoccult stools x 3 in August were negative. Last Colonoscopy was 2017 that showed internal hemorrhoids.    Patient Active Problem List   Diagnosis Date Noted  . History of total right hip replacement 08/11/2017  . Reflux esophagitis   . Benign neoplasm of transverse colon   . Hyperlipidemia 01/02/2016  . Essential hypertension 01/02/2016  . Hyperglycemia 01/02/2016  . ED (erectile dysfunction) of organic origin 02/16/2013  . Prostate cancer ([place]) 06/14/2012    Past Surgical History:  Procedure Laterality Date  . COLONOSCOPY WITH PROPOFOL N/A 07/28/2016   Procedure: COLONOSCOPY WITH PROPOFOL;  Surgeon: Lucilla Lame, MD;  Location: [facility] ENDOSCOPY;  Service: Endoscopy;  Laterality: N/A;  . PROSTATE SURGERY  2002  . PROSTATE SURGERY  2002  . TOTAL HIP ARTHROPLASTY Right 05/27/2017   Procedure: TOTAL HIP ARTHROPLASTY ANTERIOR APPROACH;  Surgeon: Hessie Knows, MD;  Location: [facility] ORS;  Service: Orthopedics;  Laterality: Right;    Family History  Problem Relation Age of Onset  . Cancer Father   .  Leukemia Father   . Hypertension Sister   . Hypertension Brother   . Hypertension Sister   . Hypertension Sister     Social History   Socioeconomic History  . Marital status: Married    Spouse name: [name]  . Number of children: 3  . Years of education: Not on file  . Highest education level: Some college, no degree  Occupational History  . Occupation: Retired    Comment: Cytogeneticist at a plant   Social Needs  . Financial resource strain: Not hard at all  . Food insecurity    Worry: Never true    Inability: Never true  . Transportation needs    Medical: No    Non-medical: No  Tobacco Use  . Smoking status: Never Smoker  . Smokeless tobacco: Never Used  Substance and Sexual Activity  . Alcohol use: Yes    Alcohol/week: 2.0 - 3.0 standard drinks    Types: 2 - 3 Cans of beer per week    Comment: daily  . Drug use: No  . Sexual activity: Yes    Partners: Female  Lifestyle  . Physical activity    Days per week: 5 days    Minutes per session: 10 min  . Stress: Not at all  Relationships  . Social connections    Talks on phone: More than three times a week    Gets together: More than three times a week    Attends religious service: More than 4  times per year    Active member of club or organization: Yes    Attends meetings of clubs or organizations: More than 4 times per year    Relationship status: Married  . Intimate partner violence    Fear of current or ex partner: No    Emotionally abused: No    Physically abused: No    Forced sexual activity: No  Other Topics Concern  . Not on file  Social History Narrative   Retired April of 2016      Does not eat meat except for occasional fish     Current Outpatient Medications:  .  acetaminophen (TYLENOL) 500 MG tablet, Take 1 tablet (500 mg total) by mouth every 6 (six) hours as needed., Disp: 30 tablet, Rfl: 0 .  amLODipine (NORVASC) 10 MG tablet, Take 1 tablet (10 mg total) by mouth daily., Disp: 90  tablet, Rfl: 0 .  ezetimibe (ZETIA) 10 MG tablet, Take 1 tablet (10 mg total) by mouth daily., Disp: 90 tablet, Rfl: 0 .  sildenafil (VIAGRA) 100 MG tablet, Take 1 tablet (100 mg total) by mouth daily as needed for erectile dysfunction., Disp: 30 tablet, Rfl: 0 .  valsartan-hydrochlorothiazide (DIOVAN-HCT) 320-25 MG tablet, Take 1 tablet by mouth daily., Disp: 90 tablet, Rfl: 0  Allergies  Allergen Reactions  . Lipitor [Atorvastatin] Rash  . Pravastatin Itching, Rash and Other (See Comments)    Redness from feet up to knees.  . Simvastatin Itching and Rash    I personally reviewed active problem list, medication list, allergies, family history, social history, health maintenance with the patient/caregiver today.   ROS  Constitutional: Negative for fever or weight change.  Respiratory: Negative for cough and shortness of breath.   Cardiovascular: Negative for chest pain or palpitations.  Gastrointestinal: Negative for abdominal pain, no bowel changes.  Musculoskeletal: Negative for gait problem or joint swelling.  Skin: Negative for rash.  Neurological: Negative for dizziness or headache.  No other specific complaints in a complete review of systems (except as listed in HPI above).  Objective  Vitals:   07/25/19 0825  BP: (!) 150/90  Pulse: 93  Resp: 16  Temp: 97.7 F (36.5 C)  TempSrc: Temporal  SpO2: 97%  Weight: 213 lb (96.6 kg)  Height: 5\' 7"  (1.702 m)    Body mass index is 33.36 kg/m.  Physical Exam  Constitutional: Patient appears well-developed and well-nourished. Obese No distress.  HEENT: head atraumatic, normocephalic, pupils equal and reactive to light Cardiovascular: Normal rate, regular rhythm and normal heart sounds.  No murmur heard. No BLE edema. Pulmonary/Chest: Effort normal and breath sounds normal. No respiratory distress. Abdominal: Soft.  There is no tenderness, negative CVA tenderness. Rectal exam yellow stools, hemoccult negative Psychiatric:  Patient has a normal mood and affect. behavior is normal. Judgment and thought content normal.  Recent Results (from the past 2160 hour(s))  Urine Microalbumin w/creat. ratio     Status: None   Collection Time: 07/03/19 12:00 AM  Result Value Ref Range   Creatinine, Urine 37 20 - 320 mg/dL   Microalb, Ur 0.2 mg/dL    Comment: Reference Range Not established    Microalb Creat Ratio 5 <30 mcg/mg creat    Comment: . The ADA defines abnormalities in albumin excretion as follows: Marland Kitchen Category         Result (mcg/mg creatinine) . Normal                    <30  Microalbuminuria         30-299  Clinical albuminuria   > OR = 300 . The ADA recommends that at least two of three specimens collected within a 3-6 month period be abnormal before considering a patient to be within a diagnostic category.   COMPLETE METABOLIC PANEL WITH GFR     Status: Abnormal   Collection Time: 07/03/19 12:00 AM  Result Value Ref Range   Glucose, Bld 102 (H) 65 - 99 mg/dL    Comment: .            Fasting reference interval . For someone without known diabetes, a glucose value between 100 and 125 mg/dL is consistent with prediabetes and should be confirmed with a follow-up test. .    BUN 3 (L) 7 - 25 mg/dL   Creat 0.98 0.70 - 1.25 mg/dL    Comment: For patients >60 years of age, the reference limit for Creatinine is approximately 13% higher for people identified as African-American. .    GFR, Est Non African American 80 > OR = 60 mL/min/1.61m2   GFR, Est African American 93 > OR = 60 mL/min/1.55m2   BUN/Creatinine Ratio 3 (L) 6 - 22 (calc)   Sodium 137 135 - 146 mmol/L   Potassium 4.8 3.5 - 5.3 mmol/L   Chloride 98 98 - 110 mmol/L   CO2 33 (H) 20 - 32 mmol/L   Calcium 9.8 8.6 - 10.3 mg/dL   Total Protein 7.1 6.1 - 8.1 g/dL   Albumin 4.2 3.6 - 5.1 g/dL   Globulin 2.9 1.9 - 3.7 g/dL (calc)   AG Ratio 1.4 1.0 - 2.5 (calc)   Total Bilirubin 0.6 0.2 - 1.2 mg/dL   Alkaline phosphatase (APISO) 70 35  - 144 U/L   AST 34 10 - 35 U/L   ALT 20 9 - 46 U/L  TSH     Status: None   Collection Time: 07/03/19 12:00 AM  Result Value Ref Range   TSH 1.23 0.40 - 4.50 mIU/L  Lipid panel     Status: Abnormal   Collection Time: 07/03/19 12:00 AM  Result Value Ref Range   Cholesterol 218 (H) <200 mg/dL   HDL 54 > OR = 40 mg/dL   Triglycerides 97 <150 mg/dL   LDL Cholesterol (Calc) 143 (H) mg/dL (calc)    Comment: Reference range: <100 . Desirable range <100 mg/dL for primary prevention;   <70 mg/dL for patients with CHD or diabetic patients  with > or = 2 CHD risk factors. Marland Kitchen LDL-C is now calculated using the Martin-Hopkins  calculation, which is a validated novel method providing  better accuracy than the Friedewald equation in the  estimation of LDL-C.  Cresenciano Genre et al. Annamaria Helling. [id number]): 2061-2068  (http://education.[facility].com/faq/FAQ164)    Total CHOL/HDL Ratio 4.0 <5.0 (calc)   Non-HDL Cholesterol (Calc) 164 (H) <130 mg/dL (calc)    Comment: For patients with diabetes plus 1 major ASCVD risk  factor, treating to a non-HDL-C goal of <100 mg/dL  (LDL-C of <70 mg/dL) is considered a therapeutic  option.   CBC with Differential/Platelet     Status: Abnormal   Collection Time: 07/25/19 12:00 AM  Result Value Ref Range   WBC 7.3 3.8 - 10.8 Thousand/uL   RBC 4.81 4.20 - 5.80 Million/uL   Hemoglobin 16.0 13.2 - 17.1 g/dL   HCT 46.2 38.5 - 50.0 %   MCV 96.0 80.0 - 100.0 fL   MCH 33.3 (H) 27.0 - 33.0 pg  MCHC 34.6 32.0 - 36.0 g/dL   RDW 14.4 11.0 - 15.0 %   Platelets 239 140 - 400 Thousand/uL   MPV 8.8 7.5 - 12.5 fL   Neutro Abs 5,570 1,500 - 7,800 cells/uL   Lymphs Abs 1,080 850 - 3,900 cells/uL   Absolute Monocytes 460 200 - 950 cells/uL   Eosinophils Absolute 139 15 - 500 cells/uL   Basophils Absolute 51 0 - 200 cells/uL   Neutrophils Relative % 76.3 %   Total Lymphocyte 14.8 %   Monocytes Relative 6.3 %   Eosinophils Relative 1.9 %   Basophils Relative 0.7 %      PHQ2/9: Depression screen [facility] 2/9 07/25/2019 07/03/2019 06/06/2019 03/24/2019 12/29/2018  Decreased Interest 0 0 0 0 0  Down, Depressed, Hopeless 2 0 0 0 0  PHQ - 2 Score 2 0 0 0 0  Altered sleeping 0 0 - 0 0  Tired, decreased energy 0 0 - 0 0  Change in appetite 0 0 - 0 0  Feeling bad or failure about yourself  0 0 - 0 0  Trouble concentrating 0 0 - 0 0  Moving slowly or fidgety/restless 0 0 - 0 0  Suicidal thoughts 0 0 - 0 0  PHQ-9 Score 2 0 - 0 0  Difficult doing work/chores Not difficult at all - - Not difficult at all Not difficult at all  Some recent data might be hidden    phq 9 is positive - he is worried about his health    Fall Risk: Fall Risk  07/25/2019 07/03/2019 06/06/2019 03/24/2019 12/29/2018  Falls in the past year? 0 0 0 0 0  Number falls in past yr: 0 0 0 0 0  Injury with Fall? 0 0 0 0 0  Follow up - - Falls prevention discussed Falls evaluation completed -    Functional Status Survey: Is the patient deaf or have difficulty hearing?: No Does the patient have difficulty seeing, even when wearing glasses/contacts?: No Does the patient have difficulty concentrating, remembering, or making decisions?: No Does the patient have difficulty walking or climbing stairs?: No Does the patient have difficulty dressing or bathing?: No Does the patient have difficulty doing errands alone such as visiting a doctor's office or shopping?: No    Assessment & Plan  1. Hematuria of unknown cause  - CULTURE, URINE COMPREHENSIVE - Ambulatory referral to Urology - CT ABDOMEN PELVIS W WO CONTRAST; Future  2. Rectal bleeding  - CBC with Differential/Platelet - Ambulatory referral to Urology - CT ABDOMEN PELVIS W [place]; Future  3. History of prostate cancer  - PSA - Ambulatory referral to Urology - CT ABDOMEN PELVIS W [place]; Future   4. Essential hypertension  - amLODipine (NORVASC) 10 MG tablet; Take 1 tablet (10 mg total) by mouth daily.  Dispense: 90  tablet; Refill: 0  BP at home today was at goal, he states nervous because of the bleeding, he will follow up with me sooner

## 2019-07-27 LAB — CULTURE, URINE COMPREHENSIVE
MICRO NUMBER:: 1153509
RESULT:: NO GROWTH
SPECIMEN QUALITY:: ADEQUATE

## 2019-07-27 LAB — CBC WITH DIFFERENTIAL/PLATELET
Absolute Monocytes: 460 cells/uL (ref 200–950)
Basophils Absolute: 51 cells/uL (ref 0–200)
Basophils Relative: 0.7 %
Eosinophils Absolute: 139 cells/uL (ref 15–500)
Eosinophils Relative: 1.9 %
HCT: 46.2 % (ref 38.5–50.0)
Hemoglobin: 16 g/dL (ref 13.2–17.1)
Lymphs Abs: 1080 cells/uL (ref 850–3900)
MCH: 33.3 pg — ABNORMAL HIGH (ref 27.0–33.0)
MCHC: 34.6 g/dL (ref 32.0–36.0)
MCV: 96 fL (ref 80.0–100.0)
MPV: 8.8 fL (ref 7.5–12.5)
Monocytes Relative: 6.3 %
Neutro Abs: 5570 cells/uL (ref 1500–7800)
Neutrophils Relative %: 76.3 %
Platelets: 239 10*3/uL (ref 140–400)
RBC: 4.81 10*6/uL (ref 4.20–5.80)
RDW: 14.4 % (ref 11.0–15.0)
Total Lymphocyte: 14.8 %
WBC: 7.3 10*3/uL (ref 3.8–10.8)

## 2019-07-27 LAB — PSA: PSA: <0.1 ng/mL (ref <=4.0)

## 2019-07-31 ENCOUNTER — Telehealth: Payer: Self-pay | Admitting: Family Medicine

## 2019-07-31 NOTE — Telephone Encounter (Signed)
Patient inquiring about lab results please advise

## 2019-07-31 NOTE — Telephone Encounter (Signed)
Called patient to give him lab results. He did not have a CT scan scheduled, but I did help him get an appointment. He has that appointment on 08/10/2019 @ 8:00. He also has an appointment with Urology on 09/06/18. He continues to have bleeding.

## 2019-08-10 ENCOUNTER — Other Ambulatory Visit: Payer: Self-pay

## 2019-08-10 ENCOUNTER — Encounter: Payer: Self-pay | Admitting: Family Medicine

## 2019-08-10 ENCOUNTER — Ambulatory Visit
Admission: RE | Admit: 2019-08-10 | Discharge: 2019-08-10 | Disposition: A | Discharge status: Home / Self Care | Payer: Medicare HMO | Source: Ambulatory Visit | Attending: Family Medicine | Admitting: Family Medicine

## 2019-08-10 DIAGNOSIS — K625 Hemorrhage of anus and rectum: Secondary | ICD-10-CM | POA: Diagnosis not present

## 2019-08-10 DIAGNOSIS — K760 Fatty (change of) liver, not elsewhere classified: Secondary | ICD-10-CM | POA: Insufficient documentation

## 2019-08-10 DIAGNOSIS — R319 Hematuria, unspecified: Secondary | ICD-10-CM | POA: Insufficient documentation

## 2019-08-10 DIAGNOSIS — R310 Gross hematuria: Secondary | ICD-10-CM | POA: Diagnosis not present

## 2019-08-10 DIAGNOSIS — Z8546 Personal history of malignant neoplasm of prostate: Secondary | ICD-10-CM | POA: Diagnosis not present

## 2019-08-10 DIAGNOSIS — I700 Atherosclerosis of aorta: Secondary | ICD-10-CM | POA: Insufficient documentation

## 2019-08-10 MED ORDER — IOHEXOL 300 MG/ML  SOLN
125.0000 mL | Freq: Once | INTRAMUSCULAR | Status: AC | PRN
  Administered 2019-08-10: 125 mL via INTRAVENOUS

## 2019-08-31 ENCOUNTER — Ambulatory Visit: Payer: Medicare HMO | Admitting: Family Medicine

## 2019-09-07 ENCOUNTER — Ambulatory Visit: Payer: Medicare HMO | Admitting: Urology

## 2019-09-07 ENCOUNTER — Other Ambulatory Visit: Payer: Self-pay | Admitting: Family Medicine

## 2019-09-07 DIAGNOSIS — I10 Essential (primary) hypertension: Secondary | ICD-10-CM

## 2019-09-11 IMAGING — DX DG HIP (WITH OR WITHOUT PELVIS) 2-3V*R*
2 series · 2 of 2 positions shown · non-contrast
Comparison: None.

CLINICAL DATA: Postop hip replaced

EXAM:
DG HIP (WITH OR WITHOUT PELVIS) 2-3V RIGHT

[hip ap]
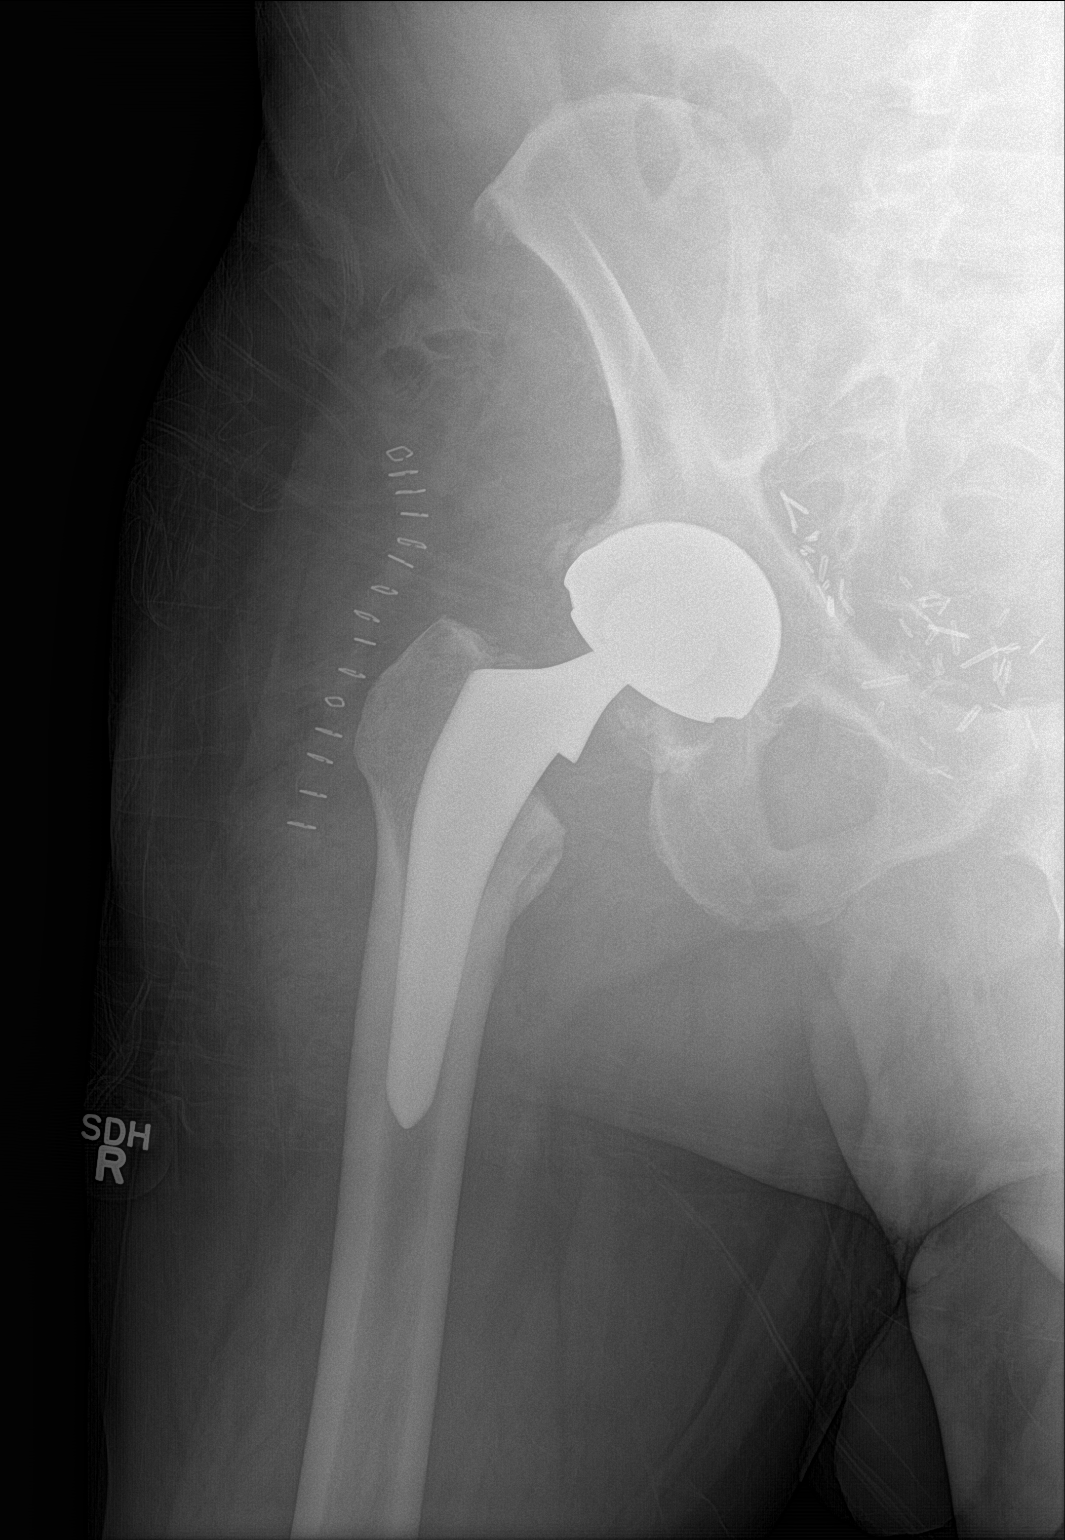

[hip lat]
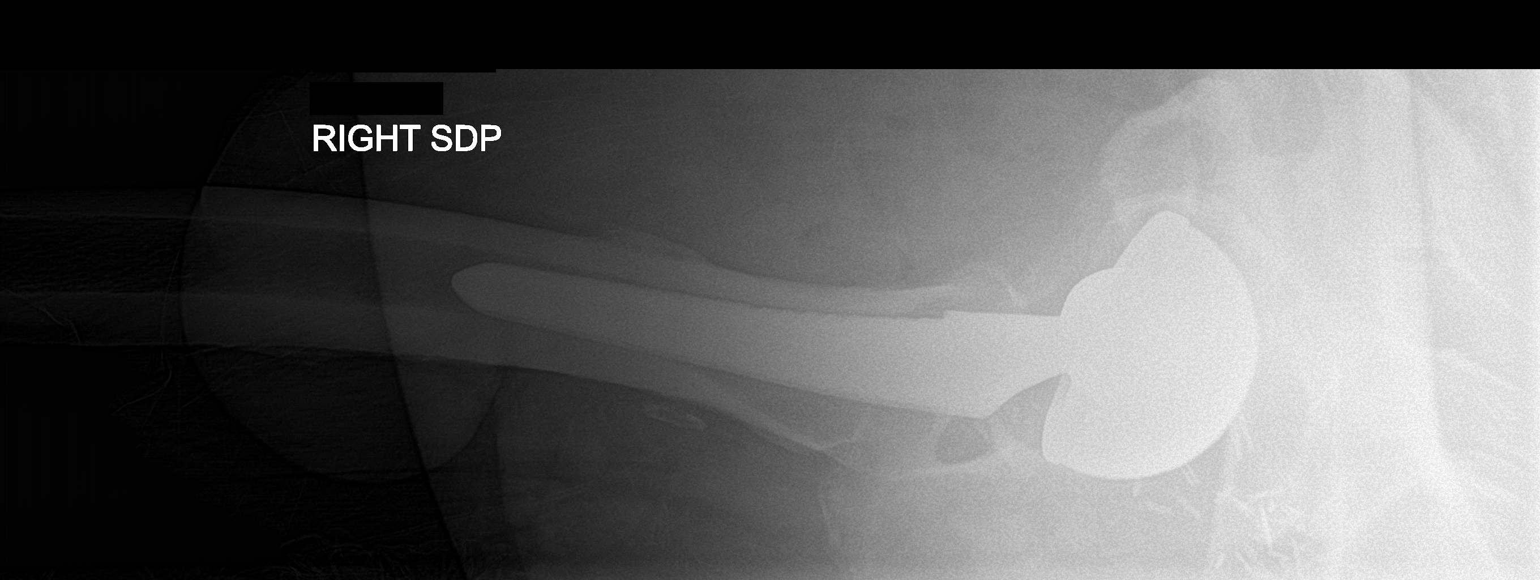

[2 of 2 positions shown; findings below may reference images not displayed]

FINDINGS: Total hip arthroplasty.  No fracture dislocation
IMPRESSION: No complication following hip arthroplasty.

## 2019-10-30 ENCOUNTER — Inpatient Hospital Stay: Payer: Medicare HMO | Attending: Radiation Oncology

## 2019-10-30 ENCOUNTER — Other Ambulatory Visit: Payer: Self-pay

## 2019-10-30 DIAGNOSIS — C61 Malignant neoplasm of prostate: Secondary | ICD-10-CM | POA: Insufficient documentation

## 2019-10-30 LAB — PSA: Prostatic Specific Antigen: 0.02 ng/mL (ref 0.00–4.00)

## 2019-11-03 ENCOUNTER — Encounter: Payer: Self-pay | Admitting: Radiation Oncology

## 2019-11-03 ENCOUNTER — Other Ambulatory Visit: Payer: Self-pay

## 2019-11-06 ENCOUNTER — Ambulatory Visit
Admission: RE | Admit: 2019-11-06 | Discharge: 2019-11-06 | Disposition: A | Discharge status: Home / Self Care | Payer: Medicare HMO | Source: Ambulatory Visit | Attending: Radiation Oncology | Admitting: Radiation Oncology

## 2019-11-06 ENCOUNTER — Other Ambulatory Visit: Payer: Self-pay

## 2019-11-06 VITALS — BP 186/103 | HR 82 | Temp 97.9°F | Resp 16 | Wt 216.7 lb

## 2019-11-06 DIAGNOSIS — C61 Malignant neoplasm of prostate: Secondary | ICD-10-CM | POA: Insufficient documentation

## 2019-11-06 DIAGNOSIS — Z923 Personal history of irradiation: Secondary | ICD-10-CM | POA: Diagnosis not present

## 2019-11-06 NOTE — Progress Notes (Signed)
Radiation Oncology Follow up Note  Name: Carlos Conley   Date:   11/06/2019 MRN:  [id number] DOB: 30-Aug-1950    This 69 y.o. male presents to the clinic today for 2-1/2-year follow-up status post salvage radiation therapy for adenocarcinoma the prostate status post prostatectomy 2003.  REFERRING PROVIDER: Steele Sizer, MD  HPI: Patient is a 69 year old male now at 2-1/2 years having completed salvage radiation therapy status post prostatectomy in 2003.  He is seen today in routine follow-up is doing well.  He specifically denies any increased lower urinary tract symptoms diarrhea or fatigue.  His most recent PSA is 1.02.Marland Kitchen  Patient has had some hematuria and CT scan which I have reviewed back in December showed no acute process in the abdomen or pelvis to explain his hematuria or blood in stool.  COMPLICATIONS OF TREATMENT: none  FOLLOW UP COMPLIANCE: keeps appointments   PHYSICAL EXAM:  BP (!) 186/103   Pulse 82   Temp 97.9 F (36.6 C)   Resp 16   Wt 216 lb 11.2 oz (98.3 kg)   SpO2 99%   BMI 33.94 kg/m  Well-developed well-nourished patient in NAD. HEENT reveals PERLA, EOMI, discs not visualized.  Oral cavity is clear. No oral mucosal lesions are identified. Neck is clear without evidence of cervical or supraclavicular adenopathy. Lungs are clear to A&P. Cardiac examination is essentially unremarkable with regular rate and rhythm without murmur rub or thrill. Abdomen is benign with no organomegaly or masses noted. Motor sensory and DTR levels are equal and symmetric in the upper and lower extremities. Cranial nerves II through XII are grossly intact. Proprioception is intact. No peripheral adenopathy or edema is identified. No motor or sensory levels are noted. Crude visual fields are within normal range.  RADIOLOGY RESULTS: CT scan reviewed compatible with above-stated findings  PLAN: Present time patient is doing well under excellent biochemical control of his prostate cancer  and pleased with his overall progress.  I have asked to see him back in 1 year for follow-up with a PSA at that time.  Patient knows to call with any concerns.  I would like to take this opportunity to thank you for allowing me to participate in the care of your patient.Noreene Filbert, MD

## 2019-12-14 ENCOUNTER — Ambulatory Visit (INDEPENDENT_AMBULATORY_CARE_PROVIDER_SITE_OTHER): Payer: Medicare HMO | Admitting: Family Medicine

## 2019-12-14 ENCOUNTER — Other Ambulatory Visit: Payer: Self-pay

## 2019-12-14 ENCOUNTER — Encounter: Payer: Self-pay | Admitting: Family Medicine

## 2019-12-14 VITALS — BP 140/80 | HR 93 | Temp 97.3°F | Resp 16 | Ht 67.0 in | Wt 212.4 lb

## 2019-12-14 DIAGNOSIS — R569 Unspecified convulsions: Secondary | ICD-10-CM

## 2019-12-14 DIAGNOSIS — I700 Atherosclerosis of aorta: Secondary | ICD-10-CM | POA: Diagnosis not present

## 2019-12-14 DIAGNOSIS — M25511 Pain in right shoulder: Secondary | ICD-10-CM

## 2019-12-14 DIAGNOSIS — E7800 Pure hypercholesterolemia, unspecified: Secondary | ICD-10-CM

## 2019-12-14 DIAGNOSIS — Z789 Other specified health status: Secondary | ICD-10-CM

## 2019-12-14 DIAGNOSIS — N5231 Erectile dysfunction following radical prostatectomy: Secondary | ICD-10-CM

## 2019-12-14 DIAGNOSIS — Z8546 Personal history of malignant neoplasm of prostate: Secondary | ICD-10-CM | POA: Diagnosis not present

## 2019-12-14 DIAGNOSIS — K760 Fatty (change of) liver, not elsewhere classified: Secondary | ICD-10-CM | POA: Diagnosis not present

## 2019-12-14 DIAGNOSIS — I10 Essential (primary) hypertension: Secondary | ICD-10-CM | POA: Diagnosis not present

## 2019-12-14 MED ORDER — EZETIMIBE 10 MG PO TABS: 10.0000 mg | ORAL_TABLET | Freq: Every day | ORAL | 0 refills | Status: DC

## 2019-12-14 MED ORDER — SILDENAFIL CITRATE 100 MG PO TABS: 100.0000 mg | ORAL_TABLET | Freq: Every day | ORAL | 0 refills | Status: DC | PRN

## 2019-12-14 MED ORDER — AMLODIPINE BESYLATE 10 MG PO TABS: 10.0000 mg | ORAL_TABLET | Freq: Every day | ORAL | 0 refills | Status: DC

## 2019-12-14 MED ORDER — ASPIRIN EC 81 MG PO TBEC: 81.0000 mg | DELAYED_RELEASE_TABLET | Freq: Every day | ORAL | 0 refills | Status: DC

## 2019-12-14 MED ORDER — VALSARTAN-HYDROCHLOROTHIAZIDE 320-25 MG PO TABS: 1.0000 | ORAL_TABLET | Freq: Every day | ORAL | 0 refills | Status: DC

## 2019-12-14 NOTE — Progress Notes (Signed)
Name: Carlos Conley   MRN: [id number]    DOB: 09/26/1950   Date:12/14/2019       Progress Note  Subjective  Chief Complaint  Chief Complaint  Patient presents with  . Shoulder Pain    Right Shoulder Pain-Onset-1 year gradually getting worst- has been taking Ibuprofen now radiating to his arm last week    HPI  Hyperlipidemia:last time ASCVD was very high we started him on statin but he had hives with multiple statins in the past,  he is now on zetia and denies side effects, reviewed last labs with patient, LDL was better but still not at goal, advised to start aspirin 81 mg daily .   HTN:he was taking Azor but states too expensive since change in insurance, we changed to  Norvasc 10  mg and Diovan 320/25 and bp at home has been in the 140's range, occasionally goes up to 150. No chest pain or palpitation, no sob .   History of prostate cancer:doing well, seeing Dr. Donella Stade yearly now. Last visit was March 2021   Seizures: he states childhood history of seizures, started started before he started school, one episode in middle school. He states usually triggered by diet. He had some syncopal episodes in July, one of the episodes he bite his tongue and wife took him to [facility], he followed up with a neurologist in [place] and anti-seizure medication was stopped. I did not get the results from his visit, he states not symptoms since July  ED from complications of prostatectomy: he takes viagra prn, he needs more refills.  No side effects of medications   Acute right shoulder pain: he has intermittent right arm pain when active, however two weeks ago he was building a patio and a brick bench, when he picked up the cement sit to place on top of brick legs he felt an acute onset of pain and a pop sensation on his right shoulder, the pain initially was sharp and intense. He has been taking Tylenol and Ibuprofen to control the pain. He states is worse with any  movement of his shoulder, also has  throbbing , pulsating feeling at night . No swelling or bruising.   Patient Active Problem List   Diagnosis Date Noted  . Hematuria 08/10/2019  . Atherosclerosis of aorta ([place]) 08/10/2019  . Fatty liver 08/10/2019  . History of total right hip replacement 08/11/2017  . Reflux esophagitis   . Benign neoplasm of transverse colon   . Hyperlipidemia 01/02/2016  . Essential hypertension 01/02/2016  . Hyperglycemia 01/02/2016  . ED (erectile dysfunction) of organic origin 02/16/2013  . Prostate cancer ([place]) 06/14/2012    Past Surgical History:  Procedure Laterality Date  . COLONOSCOPY WITH PROPOFOL N/A 07/28/2016   Procedure: COLONOSCOPY WITH PROPOFOL;  Surgeon: Lucilla Lame, MD;  Location: [facility] ENDOSCOPY;  Service: Endoscopy;  Laterality: N/A;  . PROSTATE SURGERY  2002  . PROSTATE SURGERY  2002  . TOTAL HIP ARTHROPLASTY Right 05/27/2017   Procedure: TOTAL HIP ARTHROPLASTY ANTERIOR APPROACH;  Surgeon: Hessie Knows, MD;  Location: [facility] ORS;  Service: Orthopedics;  Laterality: Right;    Family History  Problem Relation Age of Onset  . Cancer Father   . Leukemia Father   . Hypertension Sister   . Hypertension Brother   . Hypertension Sister   . Hypertension Sister     Social History   Tobacco Use  . Smoking status: Never Smoker  . Smokeless tobacco: Never Used  Substance Use Topics  .  Alcohol use: Yes    Alcohol/week: 2.0 - 3.0 standard drinks    Types: 2 - 3 Cans of beer per week    Comment: daily     Current Outpatient Medications:  .  acetaminophen (TYLENOL) 500 MG tablet, Take 1 tablet (500 mg total) by mouth every 6 (six) hours as needed., Disp: 30 tablet, Rfl: 0 .  amLODipine (NORVASC) 10 MG tablet, Take 1 tablet (10 mg total) by mouth daily., Disp: 90 tablet, Rfl: 0 .  ezetimibe (ZETIA) 10 MG tablet, Take 1 tablet (10 mg total) by mouth daily., Disp: 90 tablet, Rfl: 0 .  sildenafil (VIAGRA) 100 MG tablet, Take 1 tablet (100 mg total) by mouth daily as needed for  erectile dysfunction., Disp: 30 tablet, Rfl: 0 .  valsartan-hydrochlorothiazide (DIOVAN-HCT) 320-25 MG tablet, Take 1 tablet by mouth daily., Disp: 90 tablet, Rfl: 0  Allergies  Allergen Reactions  . Lipitor [Atorvastatin] Rash  . Pravastatin Itching, Rash and Other (See Comments)    Redness from feet up to knees.  . Simvastatin Itching and Rash    I personally reviewed active problem list, medication list, allergies, family history, social history, health maintenance with the patient/caregiver today.   ROS  Constitutional: Negative for fever or weight change.  Respiratory: Negative for cough and shortness of breath.   Cardiovascular: Negative for chest pain or palpitations.  Gastrointestinal: Negative for abdominal pain, no bowel changes.  Musculoskeletal: Negative for gait problem or joint swelling.  Skin: Negative for rash.  Neurological: Negative for dizziness or headache.  No other specific complaints in a complete review of systems (except as listed in HPI above).  Objective  Vitals:   12/14/19 1018 12/14/19 1019  BP: (!) 148/88 140/80  Pulse: 93   Resp: 16   Temp: (!) 97.3 F (36.3 C)   TempSrc: Temporal   SpO2: 96%   Weight: 212 lb 6.4 oz (96.3 kg)   Height: 5\' 7"  (1.702 m)     Body mass index is 33.27 kg/m.  Physical Exam  Constitutional: Patient appears well-developed and well-nourished. Obese No distress.  HEENT: head atraumatic, normocephalic, pupils equal and reactive to light Cardiovascular: Normal rate, regular rhythm and normal heart sounds.  No murmur heard. No BLE edema. Pulmonary/Chest: Effort normal and breath sounds normal. No respiratory distress. Abdominal: Soft.  There is no tenderness. Psychiatric: Patient has a normal mood and affect. behavior is normal. Judgment and thought content normal. Muscular skeletal: positive impingement sign , positive empty can sign, no pain during palpation of shoulder    Recent Results (from the past 2160  hour(s))  PSA     Status: None   Collection Time: 10/30/19  8:11 AM  Result Value Ref Range   Prostatic Specific Antigen 0.02 0.00 - 4.00 ng/mL    Comment: (NOTE) While PSA levels of <=4.0 ng/ml are reported as reference range, some men with levels below 4.0 ng/ml can have prostate cancer and many men with PSA above 4.0 ng/ml do not have prostate cancer.  Other tests such as free PSA, age specific reference ranges, PSA velocity and PSA doubling time may be helpful especially in men less than 21 years old. Performed at [facility], [place] [address], [place],  [address]     PHQ2/9: Depression screen [facility] 2/9 12/14/2019 07/25/2019 07/03/2019 06/06/2019 03/24/2019  Decreased Interest 0 0 0 0 0  Down, Depressed, Hopeless 0 2 0 0 0  PHQ - 2 Score 0 2 0 0 0  Altered sleeping 0 0 0 - 0  Tired, decreased energy 0 0 0 - 0  Change in appetite 0 0 0 - 0  Feeling bad or failure about yourself  0 0 0 - 0  Trouble concentrating 0 0 0 - 0  Moving slowly or fidgety/restless 0 0 0 - 0  Suicidal thoughts 0 0 0 - 0  PHQ-9 Score 0 2 0 - 0  Difficult doing work/chores Not difficult at all Not difficult at all - - Not difficult at all  Some recent data might be hidden    phq 9 is negative   Fall Risk: Fall Risk  12/14/2019 07/25/2019 07/03/2019 06/06/2019 03/24/2019  Falls in the past year? 0 0 0 0 0  Number falls in past yr: 0 0 0 0 0  Injury with Fall? 0 0 0 0 0  Follow up - - - Falls prevention discussed Falls evaluation completed    Functional Status Survey: Is the patient deaf or have difficulty hearing?: No Does the patient have difficulty seeing, even when wearing glasses/contacts?: No Does the patient have difficulty concentrating, remembering, or making decisions?: No Does the patient have difficulty walking or climbing stairs?: No Does the patient have difficulty dressing or bathing?: No Does the patient have difficulty doing errands alone such as visiting a doctor's office or  shopping?: No    Assessment & Plan  1. Essential hypertension  Not very compliant with medication but states he took daily medication for the past week  - valsartan-hydrochlorothiazide (DIOVAN-HCT) 320-25 MG tablet; Take 1 tablet by mouth daily.  Dispense: 90 tablet; Refill: 0 - amLODipine (NORVASC) 10 MG tablet; Take 1 tablet (10 mg total) by mouth daily.  Dispense: 90 tablet; Refill: 0  2. Erectile dysfunction following radical prostatectomy  - sildenafil (VIAGRA) 100 MG tablet; Take 1 tablet (100 mg total) by mouth daily as needed for erectile dysfunction.  Dispense: 30 tablet; Refill: 0  3. Pure hypercholesterolemia  - aspirin EC 81 MG tablet; Take 1 tablet (81 mg total) by mouth daily.  Dispense: 30 tablet; Refill: 0 - ezetimibe (ZETIA) 10 MG tablet; Take 1 tablet (10 mg total) by mouth daily.  Dispense: 90 tablet; Refill: 0  4. Statin intolerance   5. Seizures ([place])  No problems now   6. History of prostate cancer   7. Fatty liver  Discussed life style modification   8. Atherosclerosis of aorta (HCC)  Continue zetia, add aspirin 81 mg  9. Acute pain of right shoulder  - Ambulatory referral to Orthopedic Surgery

## 2019-12-20 ENCOUNTER — Ambulatory Visit: Payer: Medicare HMO | Admitting: Family Medicine

## 2019-12-29 DIAGNOSIS — M75101 Unspecified rotator cuff tear or rupture of right shoulder, not specified as traumatic: Secondary | ICD-10-CM | POA: Diagnosis not present

## 2019-12-29 DIAGNOSIS — M542 Cervicalgia: Secondary | ICD-10-CM | POA: Diagnosis not present

## 2019-12-29 DIAGNOSIS — M47812 Spondylosis without myelopathy or radiculopathy, cervical region: Secondary | ICD-10-CM | POA: Diagnosis not present

## 2019-12-29 DIAGNOSIS — M12811 Other specific arthropathies, not elsewhere classified, right shoulder: Secondary | ICD-10-CM | POA: Diagnosis not present

## 2019-12-29 DIAGNOSIS — M25511 Pain in right shoulder: Secondary | ICD-10-CM | POA: Diagnosis not present

## 2020-01-05 ENCOUNTER — Other Ambulatory Visit: Payer: Self-pay | Admitting: Family Medicine

## 2020-01-05 DIAGNOSIS — E7800 Pure hypercholesterolemia, unspecified: Secondary | ICD-10-CM

## 2020-01-05 NOTE — Telephone Encounter (Signed)
Requested Prescriptions  Pending Prescriptions Disp Refills  . aspirin 81 MG EC tablet [Pharmacy Med Name: [facility] ASPIRIN EC 81 MG TABLET] 90 tablet 3    Sig: TAKE 1 TABLET BY MOUTH EVERY DAY     Analgesics:  NSAIDS - aspirin Passed - 01/05/2020 11:32 AM      Passed - Patient is not pregnant      Passed - Valid encounter within last 12 months    Recent Outpatient Visits          3 weeks ago Essential hypertension   [facility] [place], Drue Stager, MD   5 months ago Hematuria of unknown cause   [facility] Steele Sizer, MD   6 months ago Essential hypertension   [facility] Steele Sizer, MD   9 months ago Seizures [facility])   [employer], [place], FNP   1 year ago Essential hypertension   [facility] Steele Sizer, MD      Future Appointments            In 3 months Ancil Boozer, Drue Stager, MD [facility], [place]   In 5 months  [facility], [facility]

## 2020-01-10 ENCOUNTER — Telehealth: Payer: Self-pay | Admitting: Family Medicine

## 2020-01-10 NOTE — Chronic Care Management (AMB) (Signed)
  Chronic Care Management   Outreach Note  01/10/2020 Name: Carlos Conley MRN: [id number] DOB: August 23, 1951  Carlos Conley is a 69 y.o. year old male who is a primary care patient of Steele Sizer, MD. I reached out to Carlos Conley by phone today in response to a referral sent by Carlos Conley [facility] health plan.     An unsuccessful telephone outreach was attempted today. The patient was referred to the case management team for assistance with care management and care coordination.   Follow Up Plan: A HIPPA compliant phone message was left for the patient providing contact information and requesting a return call.  The care management team will reach out to the patient again over the next 7 days.  If patient returns call to provider office, please advise to call [facility]  at [place], [place], [place], [place] [address] Direct Dial: [phone] [email or web address]: [email or web address]

## 2020-01-16 NOTE — Chronic Care Management (AMB) (Signed)
  Chronic Care Management   Note  01/16/2020 Name: Carlos Conley MRN: [id number] DOB: 17-Nov-1950  JARRETTE DEHNER is a 69 y.o. year old male who is a primary care patient of Steele Sizer, MD. I reached out to [name] by phone today in response to a referral sent by [name] health plan.     [name] was given information about Chronic Care Management services today including:  1. CCM service includes personalized support from designated clinical staff supervised by his physician, including individualized plan of care and coordination with other care providers 2. 24/7 contact phone numbers for assistance for urgent and routine care needs. 3. Service will only be billed when office clinical staff spend 20 minutes or more in a month to coordinate care. 4. Only one practitioner may furnish and bill the service in a calendar month. 5. The patient may stop CCM services at any time (effective at the end of the month) by phone call to the office staff. 6. The patient will be responsible for cost sharing (co-pay) of up to 20% of the service fee (after annual deductible is met).  Patient agreed to services and verbal consent obtained.   Follow up plan: Telephone appointment with care management team member scheduled for:02/16/2020  Noreene Larsson, [place], [place], [place] [address] Direct Dial: [phone] [email or web address]: [email or web address]

## 2020-02-16 ENCOUNTER — Ambulatory Visit: Payer: Self-pay

## 2020-02-16 ENCOUNTER — Telehealth: Payer: Medicare HMO

## 2020-02-16 NOTE — Chronic Care Management (AMB) (Signed)
  Chronic Care Management   Outreach Note  02/16/2020 Name: ROBERTLEE ROGACKI MRN: [id number] DOB: Jan 04, 1951  Primary Care Provider: Steele Sizer, MD Reason for referral : Chronic Care Management   An unsuccessful telephone outreach was attempted today. Mr. Rittenberry was referred to the case management team for assistance with care management and care coordination.     Follow Up Plan: The care management team will reach out to Mr. Delaughter again within the next two to three weeks.   [facility] Care Management [phone]

## 2020-03-08 ENCOUNTER — Ambulatory Visit (INDEPENDENT_AMBULATORY_CARE_PROVIDER_SITE_OTHER): Payer: Medicare HMO

## 2020-03-08 DIAGNOSIS — I10 Essential (primary) hypertension: Secondary | ICD-10-CM | POA: Diagnosis not present

## 2020-03-08 DIAGNOSIS — E7800 Pure hypercholesterolemia, unspecified: Secondary | ICD-10-CM

## 2020-03-08 NOTE — Chronic Care Management (AMB) (Signed)
Chronic Care Management   Initial Visit Note  03/08/2020 Name: Carlos Conley MRN: [id number] DOB: 1951/01/20  Primary Care Provider: Steele Sizer, MD Reason for referral : Chronic Care Management   Carlos Conley is a 69 y.o. year old male who is a primary care patient of Carlos Sizer, MD. The CCM team was consulted for assistance with chronic disease management and care coordination. A telephonic assessment was conducted today.  Review of Carlos Conley status, including review of consultants reports, relevant labs and test results was conducted today. Collaboration with appropriate care team members was performed as part of the comprehensive evaluation and provision of chronic care management services.    SDOH (Social Determinants of Health) assessments performed: Yes See Care Plan activities for detailed interventions related to SDOH  SDOH Interventions     Most Recent Value  SDOH Interventions  Food Insecurity Interventions Intervention Not Indicated  Housing Interventions Intervention Not Indicated  Physical Activity Interventions Intervention Not Indicated  Transportation Interventions Intervention Not Indicated  Alcohol Brief Interventions/Follow-up Brief Advice       Medications: Outpatient Encounter Medications as of 03/08/2020  Medication Sig Note  . ezetimibe (ZETIA) 10 MG tablet Take 1 tablet (10 mg total) by mouth daily.   . valsartan-hydrochlorothiazide (DIOVAN-HCT) 320-25 MG tablet Take 1 tablet by mouth daily.   Marland Kitchen acetaminophen (TYLENOL) 500 MG tablet Take 1 tablet (500 mg total) by mouth every 6 (six) hours as needed.   Marland Kitchen amLODipine (NORVASC) 10 MG tablet Take 1 tablet (10 mg total) by mouth daily. 03/08/2020: Reports taking 63m  . aspirin 81 MG EC tablet TAKE 1 TABLET BY MOUTH EVERY DAY   . sildenafil (VIAGRA) 100 MG tablet Take 1 tablet (100 mg total) by mouth daily as needed for erectile dysfunction.    No facility-administered encounter  medications on file as of 03/08/2020.     Objective:   BP Readings from Last 3 Encounters:  12/14/19 140/80  11/03/19 (!) 186/103  07/25/19 (!) 150/90    Lab Results  Component Value Date   CHOL 218 (H) 07/03/2019   HDL 54 07/03/2019   LDLCALC 143 (H) 07/03/2019   TRIG 97 07/03/2019   CHOLHDL 4.0 07/03/2019    Depression screen PHQ 2/9 03/08/2020 12/14/2019 07/25/2019  Decreased Interest 0 0 0  Down, Depressed, Hopeless 0 0 2  PHQ - 2 Score 0 0 2  Altered sleeping - 0 0  Tired, decreased energy - 0 0  Change in appetite - 0 0  Feeling bad or failure about yourself  - 0 0  Trouble concentrating - 0 0  Moving slowly or fidgety/restless - 0 0  Suicidal thoughts - 0 0  PHQ-9 Score - 0 2  Difficult doing work/chores - Not difficult at all Not difficult at all  Some recent data might be hidden    Functional Status Survey: Is the patient deaf or have difficulty hearing?: No Does the patient have difficulty seeing, even when wearing glasses/contacts?: No Does the patient have difficulty concentrating, remembering, or making decisions?: No Does the patient have difficulty walking or climbing stairs?: No Does the patient have difficulty dressing or bathing?: No Does the patient have difficulty doing errands alone such as visiting a doctor's office or shopping?: No    Goals Addressed            This Visit's Progress   . Hypertension Management       CARE PLAN ENTRY (see longitudinal plan of care for additional  care plan information)  Current Barriers:  . Chronic Disease Management support and education needs related to HTN and HLD.  Case Manager Clinical Goal(s):  Over the next 120 days, patient will: . Continue taking all medications as prescribed. . Attend all medical appointments as scheduled. . Monitor blood pressure and record reading. . Adhere to recommended cardiac prudent/heart healthy diet. . Follow recommended safety measures to prevent falls and  injuries.  Interventions:  . Inter-disciplinary care team collaboration (see longitudinal plan of care) . Reviewed medications and indications for use. Encouraged to continue taking medications as prescribed. Encouraged to contact care management team with concerns regarding prescription costs. . Provided information regarding established blood pressure parameters and indications for notifying provider. Encouraged to monitor a few times a week if unable to monitor daily and record readings. Reports recent reading of 140/89. Marland Kitchen Discussed current nutritional intake and heart healthy options. Encouraged to continue diet modifications to increase adherence with a cardiac prudent diet. Reports making several diet modifications and attempting to adhere to recommended diet. Eats fish most days with very limited intake of pork and red meat. Also attempting to decrease alcohol consumption. Encouraged to read nutritional labels and monitor consumption of sodium, added sugar and saturated fats. . Discussed current activity level. Reviewed safety and fall prevention measures. Encouraged to continue mild activity and exercises as tolerated to improve heart health. Encouraged to keep pathways clear and well lit to prevent falls and injuries. Encouraged to stay hydrated and avoid strenuous activity during hot weather to prevent heat exhaustion. . Reviewed scheduled/upcoming provider appointments. Encouraged to attend appointments as scheduled to prevent delays in care. Encouraged to contact care management team with concerns regarding transportation. . Discussed plans for ongoing care management and follow up. Provided direct contact information.     Patient Self Care Activities:  . Self administers medications  . Calls pharmacy for medication refills . Attends church or other social activities . Performs ADL's independently . Performs IADL's independently   Initial goal documentation        Carlos Conley  was given information about Chronic Care Management services  including:  1. CCM service includes personalized support from designated clinical staff supervised by his physician, including individualized plan of care and coordination with other care providers 2. 24/7 contact phone numbers for assistance for urgent and routine care needs. 3. Service will only be billed when office clinical staff spend 20 minutes or more in a month to coordinate care. 4. Only one practitioner may furnish and bill the service in a calendar month. 5. The patient may stop CCM services at any time (effective at the end of the month) by phone call to the office staff. 6. The patient will be responsible for cost sharing (co-pay) of up to 20% of the service fee (after annual deductible is met).  Patient agreed to services and verbal consent obtained.    PLAN The care management team will follow-up with Mr. Lavine in three months.   [facility] Care Management [phone]

## 2020-03-13 NOTE — Patient Instructions (Addendum)
Thank you for allowing the Chronic Care Management team to participate in your care.   Goals Addressed            This Visit's Progress   . Hypertension Management       CARE PLAN ENTRY (see longitudinal plan of care for additional care plan information)  Current Barriers:  . Chronic Disease Management support and education needs related to HTN and HLD.  Case Manager Clinical Goal(s):  Over the next 120 days, patient will: . Continue taking all medications as prescribed. . Attend all medical appointments as scheduled. . Monitor blood pressure and record reading. . Adhere to recommended cardiac prudent/heart healthy diet. . Follow recommended safety measures to prevent falls and injuries.  Interventions:  . Inter-disciplinary care team collaboration (see longitudinal plan of care) . Reviewed medications and indications for use. Encouraged to continue taking medications as prescribed. Encouraged to contact care management team with concerns regarding prescription costs. . Provided information regarding established blood pressure parameters and indications for notifying provider. Encouraged to monitor a few times a week if unable to monitor daily and record readings. Reports recent reading of 140/89. Marland Kitchen Discussed current nutritional intake and heart healthy options. Encouraged to continue diet modifications to increase adherence with a cardiac prudent diet. Reports making several diet modifications and attempting to adhere to recommended diet. Eats fish most days with very limited intake of pork and red meat. Also attempting to decrease alcohol consumption. Encouraged to read nutritional labels and monitor consumption of sodium, added sugar and saturated fats. . Discussed current activity level. Reviewed safety and fall prevention measures. Encouraged to continue mild activity and exercises as tolerated to improve heart health. Encouraged to keep pathways clear and well lit to prevent falls  and injuries. Encouraged to stay hydrated and avoid strenuous activity during hot weather to prevent heat exhaustion. . Reviewed scheduled/upcoming provider appointments. Encouraged to attend appointments as scheduled to prevent delays in care. Encouraged to contact care management team with concerns regarding transportation. . Discussed plans for ongoing care management and follow up. Provided direct contact information.     Patient Self Care Activities:  . Self administers medications  . Calls pharmacy for medication refills . Attends church or other social activities . Performs ADL's independently . Performs IADL's independently   Initial goal documentation        Carlos Conley was given information about Chronic Care Management services  including:  1. CCM service includes personalized support from designated clinical staff supervised by his physician, including individualized plan of care and coordination with other care providers 2. 24/7 contact phone numbers for assistance for urgent and routine care needs. 3. Service will only be billed when office clinical staff spend 20 minutes or more in a month to coordinate care. 4. Only one practitioner may furnish and bill the service in a calendar month. 5. The patient may stop CCM services at any time (effective at the end of the month) by phone call to the office staff. 6. The patient will be responsible for cost sharing (co-pay) of up to 20% of the service fee (after annual deductible is met).  Patient agreed to services and verbal consent obtained.    Carlos Conley verbalized understanding of the information discussed during the telephonic outreach today. Declined need for a mailed/printed copy of the instructions.  The care management team will follow-up with Carlos Conley in three months.   [facility] [phone]

## 2020-03-22 ENCOUNTER — Ambulatory Visit: Payer: Self-pay

## 2020-03-22 NOTE — Chronic Care Management (AMB) (Signed)
  Chronic Care Management   Note  03/22/2020 Name: Carlos Conley MRN: [id number] DOB: [birth year]   Care Coordination Only: Resources and documents submitted.    Follow up plan: The care management team will follow-up with [name] in October as scheduled.    [facility] [phone]

## 2020-04-15 NOTE — Progress Notes (Signed)
Name: Carlos Conley   MRN: [id number]    DOB: [birth year]   Date:04/16/2020       Progress Note  Subjective  Chief Complaint  Chief Complaint  Patient presents with  . Hypertension  . Gastroesophageal Reflux  . Hyperlipidemia  . Hyperglycemia    HPI  Hyperlipidemia:last time ASCVD was very high we started him on statin but he had hives with multiple statins in the past,  he is now on zetia and denies side effects, we will recheck labs. He has atherosclerosis of aorta seen on CT abdomen and pelvis.   GGY:IRSWNIOEVOJ Azor but states too expensive sincechange ininsurance,we changed toNorvasc 10  mg and Diovan 320/25 and bp at home has been in the 140's range, occasionally goes up to 150. No chest pain or palpitation, no sob .   History of prostate cancer:doing well, seeing Dr. Donella Stade yearly now. Last visit was March 2021, not due for repeat PSA yet    Seizures: he states childhood history of seizures, started before kindergarten,  one episode in middle school, he was on anti-seizure medication through elementary school. He states usually triggered by diet. He had some syncopal episodes in July 2020 , one of the episodes he bit  his tongue and wife took him to [facility], he followed up with a neurologist in [place] and anti-seizure medication was stopped. I did not get the results from his visit, he states not symptoms since July, he never stopped driving.   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  No side effects of medications. Unchanged    Hyperglycemia: we will check A1C, he has fatty liver, discussed healthier diet. He denies polyphagia, polydipsia or polyuria  Patient Active Problem List   Diagnosis Date Noted  . Hematuria 08/10/2019  . Atherosclerosis of aorta ([place]) 08/10/2019  . Fatty liver 08/10/2019  . History of total right hip replacement 08/11/2017  . Reflux esophagitis   . Benign neoplasm of transverse colon   .  Hyperlipidemia 01/02/2016  . Essential hypertension 01/02/2016  . Hyperglycemia 01/02/2016  . ED (erectile dysfunction) of organic origin 02/16/2013  . Prostate cancer ([place]) 06/14/2012    Past Surgical History:  Procedure Laterality Date  . COLONOSCOPY WITH PROPOFOL N/A 07/28/2016   Procedure: COLONOSCOPY WITH PROPOFOL;  Surgeon: Lucilla Lame, MD;  Location: [facility] ENDOSCOPY;  Service: Endoscopy;  Laterality: N/A;  . PROSTATE SURGERY  2002  . PROSTATE SURGERY  2002  . TOTAL HIP ARTHROPLASTY Right 05/27/2017   Procedure: TOTAL HIP ARTHROPLASTY ANTERIOR APPROACH;  Surgeon: Hessie Knows, MD;  Location: [facility] ORS;  Service: Orthopedics;  Laterality: Right;    Family History  Problem Relation Age of Onset  . Cancer Father   . Leukemia Father   . Hypertension Sister   . Hypertension Brother   . Hypertension Sister   . Hypertension Sister     Social History   Tobacco Use  . Smoking status: Never Smoker  . Smokeless tobacco: Never Used  Substance Use Topics  . Alcohol use: Yes    Alcohol/week: 2.0 - 3.0 standard drinks    Types: 2 - 3 Cans of beer per week    Comment: daily     Current Outpatient Medications:  .  acetaminophen (TYLENOL) 500 MG tablet, Take 1 tablet (500 mg total) by mouth every 6 (six) hours as needed., Disp: 30 tablet, Rfl: 0 .  amLODipine (NORVASC) 10 MG tablet, Take 1 tablet (10 mg total) by mouth daily.,  Disp: 90 tablet, Rfl: 1 .  aspirin 81 MG EC tablet, TAKE 1 TABLET BY MOUTH EVERY DAY, Disp: 90 tablet, Rfl: 3 .  ezetimibe (ZETIA) 10 MG tablet, Take 1 tablet (10 mg total) by mouth daily., Disp: 90 tablet, Rfl: 1 .  sildenafil (VIAGRA) 100 MG tablet, Take 1 tablet (100 mg total) by mouth daily as needed for erectile dysfunction., Disp: 30 tablet, Rfl: 0 .  valsartan-hydrochlorothiazide (DIOVAN-HCT) 320-25 MG tablet, Take 1 tablet by mouth daily., Disp: 90 tablet, Rfl: 1 .  Zoster Vaccine Adjuvanted [facility]) injection, Inject 0.5 mLs into the muscle once for 1  dose., Disp: 0.5 mL, Rfl: 1  Allergies  Allergen Reactions  . Lipitor [Atorvastatin] Rash  . Pravastatin Itching, Rash and Other (See Comments)    Redness from feet up to knees.  . Simvastatin Itching and Rash    I personally reviewed active problem list, medication list, allergies, family history, social history, health maintenance with the patient/caregiver today.   ROS  Constitutional: Negative for fever or weight change.  Respiratory: Negative for cough and shortness of breath.   Cardiovascular: Negative for chest pain or palpitations.  Gastrointestinal: Negative for abdominal pain, no bowel changes.  Musculoskeletal: Negative for gait problem or joint swelling.  Skin: Negative for rash.  Neurological: Negative for dizziness or headache.  No other specific complaints in a complete review of systems (except as listed in HPI above).  Objective   Vitals:   04/16/20 1045  BP: 130/82  Pulse: 89  Resp: 16  Temp: 98.5 F (36.9 C)  TempSrc: Oral  SpO2: 99%  Weight: 212 lb 3.2 oz (96.3 kg)  Height: 5\' 7"  (1.702 m)    Body mass index is 33.24 kg/m.  Physical Exam  Constitutional: Patient appears well-developed and well-nourished. Obese  No distress.  HEENT: head atraumatic, normocephalic, pupils equal and reactive to light,  neck supple Cardiovascular: Normal rate, regular rhythm and normal heart sounds.  No murmur heard. No BLE edema. Pulmonary/Chest: Effort normal and breath sounds normal. No respiratory distress. Abdominal: Soft.  There is no tenderness. Psychiatric: Patient has a normal mood and affect. behavior is normal. Judgment and thought content normal.  PHQ2/9: Depression screen [facility] 2/9 04/16/2020 03/08/2020 12/14/2019 07/25/2019 07/03/2019  Decreased Interest 0 0 0 0 0  Down, Depressed, Hopeless 0 0 0 2 0  PHQ - 2 Score 0 0 0 2 0  Altered sleeping 0 - 0 0 0  Tired, decreased energy 0 - 0 0 0  Change in appetite 0 - 0 0 0  Feeling bad or failure about  yourself  0 - 0 0 0  Trouble concentrating 0 - 0 0 0  Moving slowly or fidgety/restless 0 - 0 0 0  Suicidal thoughts 0 - 0 0 0  PHQ-9 Score 0 - 0 2 0  Difficult doing work/chores - - Not difficult at all Not difficult at all -  Some recent data might be hidden    phq 9 is negative  Fall Risk: Fall Risk  04/16/2020 03/08/2020 12/14/2019 07/25/2019 07/03/2019  Falls in the past year? 0 0 0 0 0  Number falls in past yr: 0 - 0 0 0  Injury with Fall? 0 - 0 0 0  Follow up - - - - -     Functional Status Survey: Is the patient deaf or have difficulty hearing?: No Does the patient have difficulty seeing, even when wearing glasses/contacts?: No Does the patient have difficulty concentrating, remembering, or  making decisions?: No Does the patient have difficulty walking or climbing stairs?: No Does the patient have difficulty dressing or bathing?: No Does the patient have difficulty doing errands alone such as visiting a doctor's office or shopping?: No    Assessment & Plan  1. Essential hypertension  - amLODipine (NORVASC) 10 MG tablet; Take 1 tablet (10 mg total) by mouth daily.  Dispense: 90 tablet; Refill: 1 - valsartan-hydrochlorothiazide (DIOVAN-HCT) 320-25 MG tablet; Take 1 tablet by mouth daily.  Dispense: 90 tablet; Refill: 1 - CBC with Differential/Platelet - COMPLETE METABOLIC PANEL WITH GFR  2. Pure hypercholesterolemia  - ezetimibe (ZETIA) 10 MG tablet; Take 1 tablet (10 mg total) by mouth daily.  Dispense: 90 tablet; Refill: 1 - Lipid panel  3. Erectile dysfunction following radical prostatectomy  - sildenafil (VIAGRA) 100 MG tablet; Take 1 tablet (100 mg total) by mouth daily as needed for erectile dysfunction.  Dispense: 30 tablet; Refill: 0  4. Atherosclerosis of aorta ([facility])  On zetia, cannot tolerate statin   5. History of prostate cancer  Sees Dr. Donella Stade once a year   4. Statin intolerance  On zetia   7. Fatty liver  He is trying to eat a healthier  diet   8. Seizures ([place])  Happened one year ago, not on medication and no recurrence    9. Hyperglycemia  - Hemoglobin A1c

## 2020-04-16 ENCOUNTER — Encounter: Payer: Self-pay | Admitting: Family Medicine

## 2020-04-16 ENCOUNTER — Ambulatory Visit (INDEPENDENT_AMBULATORY_CARE_PROVIDER_SITE_OTHER): Payer: Medicare HMO | Admitting: Family Medicine

## 2020-04-16 ENCOUNTER — Other Ambulatory Visit: Payer: Self-pay

## 2020-04-16 VITALS — BP 130/82 | HR 89 | Temp 98.5°F | Resp 16 | Ht 67.0 in | Wt 212.2 lb

## 2020-04-16 DIAGNOSIS — Z23 Encounter for immunization: Secondary | ICD-10-CM

## 2020-04-16 DIAGNOSIS — E7800 Pure hypercholesterolemia, unspecified: Secondary | ICD-10-CM | POA: Diagnosis not present

## 2020-04-16 DIAGNOSIS — R569 Unspecified convulsions: Secondary | ICD-10-CM | POA: Diagnosis not present

## 2020-04-16 DIAGNOSIS — N5231 Erectile dysfunction following radical prostatectomy: Secondary | ICD-10-CM

## 2020-04-16 DIAGNOSIS — R739 Hyperglycemia, unspecified: Secondary | ICD-10-CM | POA: Diagnosis not present

## 2020-04-16 DIAGNOSIS — K760 Fatty (change of) liver, not elsewhere classified: Secondary | ICD-10-CM | POA: Diagnosis not present

## 2020-04-16 DIAGNOSIS — I10 Essential (primary) hypertension: Secondary | ICD-10-CM | POA: Diagnosis not present

## 2020-04-16 DIAGNOSIS — Z789 Other specified health status: Secondary | ICD-10-CM

## 2020-04-16 DIAGNOSIS — Z8546 Personal history of malignant neoplasm of prostate: Secondary | ICD-10-CM

## 2020-04-16 DIAGNOSIS — I700 Atherosclerosis of aorta: Secondary | ICD-10-CM | POA: Diagnosis not present

## 2020-04-16 MED ORDER — ZOSTER VAC RECOMB ADJUVANTED 50 MCG/0.5ML IM SUSR: 0.5000 mL | Freq: Once | INTRAMUSCULAR | 1 refills | Status: AC

## 2020-04-16 MED ORDER — EZETIMIBE 10 MG PO TABS: 10.0000 mg | ORAL_TABLET | Freq: Every day | ORAL | 1 refills | Status: DC

## 2020-04-16 MED ORDER — AMLODIPINE BESYLATE 10 MG PO TABS: 10.0000 mg | ORAL_TABLET | Freq: Every day | ORAL | 1 refills | Status: DC

## 2020-04-16 MED ORDER — VALSARTAN-HYDROCHLOROTHIAZIDE 320-25 MG PO TABS: 1.0000 | ORAL_TABLET | Freq: Every day | ORAL | 1 refills | Status: DC

## 2020-04-16 MED ORDER — SILDENAFIL CITRATE 100 MG PO TABS: 100.0000 mg | ORAL_TABLET | Freq: Every day | ORAL | 0 refills | Status: DC | PRN

## 2020-04-17 LAB — HEMOGLOBIN A1C
Hgb A1c MFr Bld: 5.3 % of total Hgb (ref <5.7)
Mean Plasma Glucose: 105 (calc)
eAG (mmol/L): 5.8 (calc)

## 2020-04-17 LAB — LIPID PANEL
Cholesterol: 230 mg/dL — ABNORMAL HIGH (ref <200)
HDL: 55 mg/dL (ref >=40)
LDL Cholesterol (Calc): 157 mg/dL (calc) — ABNORMAL HIGH
Non-HDL Cholesterol (Calc): 175 mg/dL (calc) — ABNORMAL HIGH (ref <130)
Total CHOL/HDL Ratio: 4.2 (calc) (ref <5.0)
Triglycerides: 79 mg/dL (ref <150)

## 2020-04-17 LAB — CBC WITH DIFFERENTIAL/PLATELET
Absolute Monocytes: 476 cells/uL (ref 200–950)
Basophils Absolute: 62 cells/uL (ref 0–200)
Basophils Relative: 0.9 %
Eosinophils Absolute: 90 cells/uL (ref 15–500)
Eosinophils Relative: 1.3 %
HCT: 45.9 % (ref 38.5–50.0)
Hemoglobin: 16 g/dL (ref 13.2–17.1)
Lymphs Abs: 1083 cells/uL (ref 850–3900)
MCH: 34 pg — ABNORMAL HIGH (ref 27.0–33.0)
MCHC: 34.9 g/dL (ref 32.0–36.0)
MCV: 97.5 fL (ref 80.0–100.0)
MPV: 8.9 fL (ref 7.5–12.5)
Monocytes Relative: 6.9 %
Neutro Abs: 5189 cells/uL (ref 1500–7800)
Neutrophils Relative %: 75.2 %
Platelets: 197 10*3/uL (ref 140–400)
RBC: 4.71 10*6/uL (ref 4.20–5.80)
RDW: 13.7 % (ref 11.0–15.0)
Total Lymphocyte: 15.7 %
WBC: 6.9 10*3/uL (ref 3.8–10.8)

## 2020-04-17 LAB — COMPLETE METABOLIC PANEL WITH GFR
AG Ratio: 1.4 (calc) (ref 1.0–2.5)
ALT: 31 U/L (ref 9–46)
AST: 74 U/L — ABNORMAL HIGH (ref 10–35)
Albumin: 4.2 g/dL (ref 3.6–5.1)
Alkaline phosphatase (APISO): 72 U/L (ref 35–144)
BUN/Creatinine Ratio: 6 (calc) (ref 6–22)
BUN: 5 mg/dL — ABNORMAL LOW (ref 7–25)
CO2: 25 mmol/L (ref 20–32)
Calcium: 9.3 mg/dL (ref 8.6–10.3)
Chloride: 94 mmol/L — ABNORMAL LOW (ref 98–110)
Creat: 0.89 mg/dL (ref 0.70–1.25)
GFR, Est African American: 103 mL/min/{1.73_m2} (ref >=60)
GFR, Est Non African American: 88 mL/min/{1.73_m2} (ref >=60)
Globulin: 3 g/dL (calc) (ref 1.9–3.7)
Glucose, Bld: 92 mg/dL (ref 65–99)
Potassium: 4.2 mmol/L (ref 3.5–5.3)
Sodium: 130 mmol/L — ABNORMAL LOW (ref 135–146)
Total Bilirubin: 0.7 mg/dL (ref 0.2–1.2)
Total Protein: 7.2 g/dL (ref 6.1–8.1)

## 2020-04-22 ENCOUNTER — Other Ambulatory Visit: Payer: Self-pay | Admitting: Family Medicine

## 2020-04-22 DIAGNOSIS — E871 Hypo-osmolality and hyponatremia: Secondary | ICD-10-CM

## 2020-06-03 ENCOUNTER — Ambulatory Visit: Payer: Self-pay

## 2020-06-03 DIAGNOSIS — I10 Essential (primary) hypertension: Secondary | ICD-10-CM

## 2020-06-03 NOTE — Telephone Encounter (Signed)
Pt. Reports he went without taking his BP medications x 1 week. Started taking them last week. Over the weekend BP 160/100 and had "some dizziness." No dizziness today."I feel fine today." No availability with PCP. Pt. Also reports he "never came back in for my repeat lab work." Please advise pt.  Reason for Disposition . Systolic BP  >= 295 OR Diastolic >= 188  Answer Assessment - Initial Assessment Questions 1. BLOOD PRESSURE: "What is the blood pressure?" "Did you take at least two measurements 5 minutes apart?"     160/100 over the weekend 2. ONSET: "When did you take your blood pressure?"     The weekend 3. HOW: "How did you obtain the blood pressure?" (e.g., visiting nurse, automatic home BP monitor)     Home monitor 4. HISTORY: "Do you have a history of high blood pressure?"     Yes 5. MEDICATIONS: "Are you taking any medications for blood pressure?" "Have you missed any doses recently?"     Yes - 1 week 6. OTHER SYMPTOMS: "Do you have any symptoms?" (e.g., headache, chest pain, blurred vision, difficulty breathing, weakness)     Dizziness over the weekend 7. PREGNANCY: "Is there any chance you are pregnant?" "When was your last menstrual period?"     n/a  Protocols used: BLOOD PRESSURE - HIGH-A-AH

## 2020-06-03 NOTE — Telephone Encounter (Signed)
lvm to inform pt that he can come in and get BP,flu shot and labs done. Per Dr Ancil Boozer

## 2020-06-03 NOTE — Chronic Care Management (AMB) (Signed)
°  Chronic Care Management   Note  06/03/2020 Name: Carlos Conley MRN: [id number] DOB: [birth year]   Carlos Conley is currently engaged with the chronic care management team. He was scheduled for a routine telephonic outreach today. He reports elevated blood pressure readings and feeling very dizzy over the past few days. He was unable to recall exact readings but reports systolic in the high 570'V. Recalls the highest diastolic reading ranging around 102.  Denies complaints of chest pain, palpitations or shortness of breath. Denies falls but does report stumbling due to being dizzy.  States he thought he was due for a follow-up with his PCP this week and was planning to discuss during the visit. Per chart review, he is not scheduled for a PCP visit until February. Contacted the clinic to confirm and schedule visit if needed. Per Carlos Conley, the staff needed to speak with Carlos Conley directly to triage and schedule if needed. She was able to successfully connect with him today.  A member of the care management team will follow-up with him later this week.    Follow up plan: A member of the care management team will follow-up with Carlos Conley later this week.    [facility] [phone]

## 2020-06-04 ENCOUNTER — Other Ambulatory Visit: Payer: Self-pay

## 2020-06-04 ENCOUNTER — Ambulatory Visit (INDEPENDENT_AMBULATORY_CARE_PROVIDER_SITE_OTHER): Payer: Medicare HMO

## 2020-06-04 VITALS — BP 130/78

## 2020-06-04 DIAGNOSIS — Z23 Encounter for immunization: Secondary | ICD-10-CM | POA: Diagnosis not present

## 2020-06-04 DIAGNOSIS — E871 Hypo-osmolality and hyponatremia: Secondary | ICD-10-CM | POA: Diagnosis not present

## 2020-06-04 NOTE — Progress Notes (Deleted)
Name: Carlos Conley   MRN: [id number]    DOB: [birth year]   Date:06/04/2020       Progress Note  Subjective  Chief Complaint  No chief complaint on file.   HPI  Dizziness   Patient Active Problem List   Diagnosis Date Noted   Hematuria 08/10/2019   Atherosclerosis of aorta ([place]) 08/10/2019   Fatty liver 08/10/2019   History of total right hip replacement 08/11/2017   Reflux esophagitis    Benign neoplasm of transverse colon    Hyperlipidemia 01/02/2016   Essential hypertension 01/02/2016   Hyperglycemia 01/02/2016   ED (erectile dysfunction) of organic origin 02/16/2013   Prostate cancer ([place]) 06/14/2012    Past Surgical History:  Procedure Laterality Date   COLONOSCOPY WITH PROPOFOL N/A 07/28/2016   Procedure: COLONOSCOPY WITH PROPOFOL;  Surgeon: Lucilla Lame, MD;  Location: [facility] ENDOSCOPY;  Service: Endoscopy;  Laterality: N/A;   PROSTATE SURGERY  2002   PROSTATE SURGERY  2002   TOTAL HIP ARTHROPLASTY Right 05/27/2017   Procedure: TOTAL HIP ARTHROPLASTY ANTERIOR APPROACH;  Surgeon: Hessie Knows, MD;  Location: [facility] ORS;  Service: Orthopedics;  Laterality: Right;    Family History  Problem Relation Age of Onset   Cancer Father    Leukemia Father    Hypertension Sister    Hypertension Brother    Hypertension Sister    Hypertension Sister     Social History   Tobacco Use   Smoking status: Never Smoker   Smokeless tobacco: Never Used  Substance Use Topics   Alcohol use: Yes    Alcohol/week: 2.0 - 3.0 standard drinks    Types: 2 - 3 Cans of beer per week    Comment: daily     Current Outpatient Medications:    acetaminophen (TYLENOL) 500 MG tablet, Take 1 tablet (500 mg total) by mouth every 6 (six) hours as needed., Disp: 30 tablet, Rfl: 0   amLODipine (NORVASC) 10 MG tablet, Take 1 tablet (10 mg total) by mouth daily., Disp: 90 tablet, Rfl: 1   aspirin 81 MG EC tablet, TAKE 1 TABLET BY MOUTH EVERY DAY, Disp: 90 tablet, Rfl:  3   ezetimibe (ZETIA) 10 MG tablet, Take 1 tablet (10 mg total) by mouth daily., Disp: 90 tablet, Rfl: 1   sildenafil (VIAGRA) 100 MG tablet, Take 1 tablet (100 mg total) by mouth daily as needed for erectile dysfunction., Disp: 30 tablet, Rfl: 0   valsartan-hydrochlorothiazide (DIOVAN-HCT) 320-25 MG tablet, Take 1 tablet by mouth daily., Disp: 90 tablet, Rfl: 1  Allergies  Allergen Reactions   Lipitor [Atorvastatin] Rash   Pravastatin Itching, Rash and Other (See Comments)    Redness from feet up to knees.   Simvastatin Itching and Rash    I personally reviewed {Reviewed:14835} with the patient/caregiver today.   ROS  ***  Objective  There were no vitals filed for this visit.  There is no height or weight on file to calculate BMI.  Physical Exam ***  Recent Results (from the past 2160 hour(s))  Lipid panel     Status: Abnormal   Collection Time: 04/16/20 10:55 AM  Result Value Ref Range   Cholesterol 230 (H) <200 mg/dL   HDL 55 > OR = 40 mg/dL   Triglycerides 79 <150 mg/dL   LDL Cholesterol (Calc) 157 (H) mg/dL (calc)    Comment: Reference range: <100 . Desirable range <100 mg/dL for primary prevention;   <70 mg/dL for patients with CHD or diabetic patients  with >  or = 2 CHD risk factors. Marland Kitchen LDL-C is now calculated using the Martin-Hopkins  calculation, which is a validated novel method providing  better accuracy than the Friedewald equation in the  estimation of LDL-C.  Cresenciano Genre et al. Annamaria Helling. 1610;960(45): 2061-2068  (http://education.[employer].com/faq/FAQ164)    Total CHOL/HDL Ratio 4.2 <5.0 (calc)   Non-HDL Cholesterol (Calc) 175 (H) <130 mg/dL (calc)    Comment: For patients with diabetes plus 1 major ASCVD risk  factor, treating to a non-HDL-C goal of <100 mg/dL  (LDL-C of <70 mg/dL) is considered a therapeutic  option.   CBC with Differential/Platelet     Status: Abnormal   Collection Time: 04/16/20 10:55 AM  Result Value Ref Range   WBC  6.9 3.8 - 10.8 Thousand/uL   RBC 4.71 4.20 - 5.80 Million/uL   Hemoglobin 16.0 13.2 - 17.1 g/dL   HCT 45.9 38 - 50 %   MCV 97.5 80.0 - 100.0 fL   MCH 34.0 (H) 27.0 - 33.0 pg   MCHC 34.9 32.0 - 36.0 g/dL   RDW 13.7 11.0 - 15.0 %   Platelets 197 140 - 400 Thousand/uL   MPV 8.9 7.5 - 12.5 fL   Neutro Abs 5,189 1,500 - 7,800 cells/uL   Lymphs Abs 1,083 850 - 3,900 cells/uL   Absolute Monocytes 476 200 - 950 cells/uL   Eosinophils Absolute 90 15.0 - 500.0 cells/uL   Basophils Absolute 62 0.0 - 200.0 cells/uL   Neutrophils Relative % 75.2 %   Total Lymphocyte 15.7 %   Monocytes Relative 6.9 %   Eosinophils Relative 1.3 %   Basophils Relative 0.9 %  COMPLETE METABOLIC PANEL WITH GFR     Status: Abnormal   Collection Time: 04/16/20 10:55 AM  Result Value Ref Range   Glucose, Bld 92 65 - 99 mg/dL    Comment: .            Fasting reference interval .    BUN 5 (L) 7 - 25 mg/dL   Creat 0.89 0.70 - 1.25 mg/dL    Comment: For patients >30 years of age, the reference limit for Creatinine is approximately 13% higher for people identified as African-American. .    GFR, Est Non African American 88 > OR = 60 mL/min/1.29m2   GFR, Est African American 103 > OR = 60 mL/min/1.25m2   BUN/Creatinine Ratio 6 6 - 22 (calc)   Sodium 130 (L) 135 - 146 mmol/L   Potassium 4.2 3.5 - 5.3 mmol/L   Chloride 94 (L) 98 - 110 mmol/L   CO2 25 20 - 32 mmol/L   Calcium 9.3 8.6 - 10.3 mg/dL   Total Protein 7.2 6.1 - 8.1 g/dL   Albumin 4.2 3.6 - 5.1 g/dL   Globulin 3.0 1.9 - 3.7 g/dL (calc)   AG Ratio 1.4 1.0 - 2.5 (calc)   Total Bilirubin 0.7 0.2 - 1.2 mg/dL   Alkaline phosphatase (APISO) 72 35 - 144 U/L   AST 74 (H) 10 - 35 U/L   ALT 31 9 - 46 U/L  Hemoglobin A1c     Status: None   Collection Time: 04/16/20 10:55 AM  Result Value Ref Range   Hgb A1c MFr Bld 5.3 <5.7 % of total Hgb    Comment: For the purpose of screening for the presence of diabetes: . <5.7%       Consistent with the absence of  diabetes 5.7-6.4%    Consistent with increased risk for diabetes             (  prediabetes) > or =6.5%  Consistent with diabetes . This assay result is consistent with a decreased risk of diabetes. . Currently, no consensus exists regarding use of hemoglobin A1c for diagnosis of diabetes in children. . According to American Diabetes Association (ADA) guidelines, hemoglobin A1c <7.0% represents optimal control in non-pregnant diabetic patients. Different metrics may apply to specific patient populations.  Standards of Medical Care in Diabetes(ADA). .    Mean Plasma Glucose 105 (calc)   eAG (mmol/L) 5.8 (calc)    Diabetic Foot Exam: Diabetic Foot Exam - Simple   No data filed     ***  PHQ2/9: Depression screen [facility] 2/9 04/16/2020 03/08/2020 12/14/2019 07/25/2019 07/03/2019  Decreased Interest 0 0 0 0 0  Down, Depressed, Hopeless 0 0 0 2 0  PHQ - 2 Score 0 0 0 2 0  Altered sleeping 0 - 0 0 0  Tired, decreased energy 0 - 0 0 0  Change in appetite 0 - 0 0 0  Feeling bad or failure about yourself  0 - 0 0 0  Trouble concentrating 0 - 0 0 0  Moving slowly or fidgety/restless 0 - 0 0 0  Suicidal thoughts 0 - 0 0 0  PHQ-9 Score 0 - 0 2 0  Difficult doing work/chores - - Not difficult at all Not difficult at all -  Some recent data might be hidden    phq 9 is {gen pos [id number]} ***  Fall Risk: Fall Risk  04/16/2020 03/08/2020 12/14/2019 07/25/2019 07/03/2019  Falls in the past year? 0 0 0 0 0  Number falls in past yr: 0 - 0 0 0  Injury with Fall? 0 - 0 0 0  Follow up - - - - -   ***   Functional Status Survey:   ***   Assessment & Plan  *** There are no diagnoses linked to this encounter.

## 2020-06-05 ENCOUNTER — Ambulatory Visit: Payer: Medicare HMO | Admitting: Family Medicine

## 2020-06-05 LAB — BASIC METABOLIC PANEL WITH GFR
BUN/Creatinine Ratio: 6 (calc) (ref 6–22)
BUN: 6 mg/dL — ABNORMAL LOW (ref 7–25)
CO2: 27 mmol/L (ref 20–32)
Calcium: 9.5 mg/dL (ref 8.6–10.3)
Chloride: 96 mmol/L — ABNORMAL LOW (ref 98–110)
Creat: 1.08 mg/dL (ref 0.70–1.25)
GFR, Est African American: 82 mL/min/{1.73_m2} (ref >=60)
GFR, Est Non African American: 71 mL/min/{1.73_m2} (ref >=60)
Glucose, Bld: 102 mg/dL — ABNORMAL HIGH (ref 65–99)
Potassium: 4.2 mmol/L (ref 3.5–5.3)
Sodium: 135 mmol/L (ref 135–146)

## 2020-06-07 ENCOUNTER — Telehealth: Payer: Self-pay

## 2020-06-07 NOTE — Telephone Encounter (Signed)
  Chronic Care Management   Outreach Note  06/07/2020 Name: Carlos Conley MRN: [id number] DOB: 05/02/1951  Primary Care Provider: Steele Sizer, MD Reason for referral : Chronic Care Management     An unsuccessful telephone outreach was attempted today. He is currently engaged with the chronic care management team.   Follow Up Plan:  A HIPAA compliant voice message was left today requesting a return call.     [facility] [phone]

## 2020-06-11 ENCOUNTER — Other Ambulatory Visit: Payer: Self-pay

## 2020-06-11 ENCOUNTER — Ambulatory Visit (INDEPENDENT_AMBULATORY_CARE_PROVIDER_SITE_OTHER): Payer: Medicare HMO

## 2020-06-11 DIAGNOSIS — Z0000 Encounter for general adult medical examination without abnormal findings: Secondary | ICD-10-CM

## 2020-06-11 NOTE — Progress Notes (Signed)
Subjective:   Carlos Conley is a 69 y.o. male who presents for Medicare Annual/Subsequent preventive examination.  Virtual Visit via Telephone Note  I connected with  Carlos Conley on 06/11/20 at  8:40 AM EDT by telephone and verified that I am speaking with the correct person using two identifiers.  Medicare Annual Wellness visit completed telephonically due to Covid-19 pandemic.   Location: Patient: home Provider: [place]   I discussed the limitations, risks, security and privacy concerns of performing an evaluation and management service by telephone and the availability of in person appointments. The patient expressed understanding and agreed to proceed.  Unable to perform video visit due to video visit attempted and failed and/or patient does not have video capability.   Some vital signs may be absent or patient reported.   Carlos Marker, LPN    Review of Systems     Cardiac Risk Factors include: advanced age (>74men, >41 women);dyslipidemia;male gender;hypertension     Objective:    There were no vitals filed for this visit. There is no height or weight on file to calculate BMI.  Advanced Directives 06/11/2020 06/06/2019 03/18/2019 11/07/2018 10/25/2017 05/27/2017 05/12/2017  Does Patient Have a Medical Advance Directive? No No No No No No No  Would patient like information on creating a medical advance directive? Yes (MAU/Ambulatory/Procedural Areas - Information given) Yes (MAU/Ambulatory/Procedural Areas - Information given) No - Patient declined No - Patient declined No - Patient declined No - Patient declined No - Patient declined    Current Medications (verified) Outpatient Encounter Medications as of 06/11/2020  Medication Sig  . acetaminophen (TYLENOL) 500 MG tablet Take 1 tablet (500 mg total) by mouth every 6 (six) hours as needed.  Marland Kitchen amLODipine (NORVASC) 10 MG tablet Take 1 tablet (10 mg total) by mouth daily.  Marland Kitchen aspirin 81 MG EC tablet TAKE 1 TABLET BY  MOUTH EVERY DAY  . ezetimibe (ZETIA) 10 MG tablet Take 1 tablet (10 mg total) by mouth daily.  . sildenafil (VIAGRA) 100 MG tablet Take 1 tablet (100 mg total) by mouth daily as needed for erectile dysfunction.  . valsartan-hydrochlorothiazide (DIOVAN-HCT) 320-25 MG tablet Take 1 tablet by mouth daily.   No facility-administered encounter medications on file as of 06/11/2020.    Allergies (verified) Lipitor [atorvastatin], Pravastatin, and Simvastatin   History: Past Medical History:  Diagnosis Date  . Arthritis   . Cancer [facility])    Prostate  . Hyperlipidemia   . Hypertension    Past Surgical History:  Procedure Laterality Date  . COLONOSCOPY WITH PROPOFOL N/A 07/28/2016   Procedure: COLONOSCOPY WITH PROPOFOL;  Surgeon: Lucilla Lame, MD;  Location: [facility] ENDOSCOPY;  Service: Endoscopy;  Laterality: N/A;  . PROSTATE SURGERY  2002  . PROSTATE SURGERY  2002  . TOTAL HIP ARTHROPLASTY Right 05/27/2017   Procedure: TOTAL HIP ARTHROPLASTY ANTERIOR APPROACH;  Surgeon: Hessie Knows, MD;  Location: [facility] ORS;  Service: Orthopedics;  Laterality: Right;   Family History  Problem Relation Age of Onset  . Cancer Father   . Leukemia Father   . Hypertension Sister   . Hypertension Brother   . Hypertension Sister   . Hypertension Sister    Social History   Socioeconomic History  . Marital status: Married    Spouse name: [name]  . Number of children: 3  . Years of education: Not on file  . Highest education level: Some college, no degree  Occupational History  . Occupation: Retired    Comment: Cytogeneticist  at a plant   Tobacco Use  . Smoking status: Never Smoker  . Smokeless tobacco: Never Used  Vaping Use  . Vaping Use: Never used  Substance and Sexual Activity  . Alcohol use: Yes    Alcohol/week: 2.0 - 3.0 standard drinks    Types: 2 - 3 Cans of beer per week    Comment: daily  . Drug use: No  . Sexual activity: Yes    Partners: Female  Other Topics Concern  . Not on  file  Social History Narrative   Retired April of 2016      Does not eat meat except for occasional fish   Social Determinants of Health   Financial Resource Strain: Low Risk   . Difficulty of Paying Living Expenses: Not hard at all  Food Insecurity: No Food Insecurity  . Worried About Charity fundraiser in the Last Year: Never true  . Ran Out of Food in the Last Year: Never true  Transportation Needs: No Transportation Needs  . Lack of Transportation (Medical): No  . Lack of Transportation (Non-Medical): No  Physical Activity: Insufficiently Active  . Days of Exercise per Week: 5 days  . Minutes of Exercise per Session: 20 min  Stress: No Stress Concern Present  . Feeling of Stress : Not at all  Social Connections: Moderately Integrated  . Frequency of Communication with Friends and Family: More than three times a week  . Frequency of Social Gatherings with Friends and Family: More than three times a week  . Attends Religious Services: More than 4 times per year  . Active Member of Clubs or Organizations: No  . Attends Archivist Meetings: Never  . Marital Status: Married    Tobacco Counseling Counseling given: Not Answered   Clinical Intake:  Pre-visit preparation completed: Yes  Pain : No/denies pain     Nutritional Risks: None Diabetes: No  How often do you need to have someone help you when you read instructions, pamphlets, or other written materials from your doctor or pharmacy?: 1 - Never    Interpreter Needed?: No  Information entered by :: Carlos Marker LPN   Activities of Daily Living In your present state of health, do you have any difficulty performing the following activities: 06/11/2020 04/16/2020  Hearing? N N  Comment declines hearing aids -  Vision? N N  Difficulty concentrating or making decisions? N N  Walking or climbing stairs? N N  Dressing or bathing? N N  Doing errands, shopping? N N  Preparing Food and eating ? N -    Using the Toilet? N -  In the past six months, have you accidently leaked urine? N -  Do you have problems with loss of bowel control? N -  Managing your Medications? N -  Managing your Finances? N -  Housekeeping or managing your Housekeeping? N -  Some recent data might be hidden    Patient Care Team: Steele Sizer, MD as PCP - General (Family Medicine) Noreene Filbert, MD as Referring Physician (Radiation Oncology) Neldon Labella, RN as Case Manager  Indicate any recent Medical Services you may have received from other than [facility] providers in the past year (date may be approximate).     Assessment:   This is a routine wellness examination for Alfredo.  Hearing/Vision screen  Hearing Screening   125Hz  250Hz  500Hz  1000Hz  2000Hz  3000Hz  4000Hz  6000Hz  8000Hz   Right ear:           Left  ear:           Comments: Pt denies hearing difficulty   Vision Screening Comments: Pt is past due for eye exam, plans to contact insurance for preferred provider  Dietary issues and exercise activities discussed: Current Exercise Habits: Home exercise routine, Type of exercise: Other - see comments (yard work), Time (Minutes): 20, Frequency (Times/Week): 5, Weekly Exercise (Minutes/Week): 100, Intensity: Mild, Exercise limited by: None identified  Goals    . DIET - INCREASE WATER INTAKE     Recommend drinking 6-8 glasses of water per day    . Hypertension Management     CARE PLAN ENTRY (see longitudinal plan of care for additional care plan information)  Current Barriers:  . Chronic Disease Management support and education needs related to HTN and HLD.  Case Manager Clinical Goal(s):  Over the next 120 days, patient will: . Continue taking all medications as prescribed. . Attend all medical appointments as scheduled. . Monitor blood pressure and record reading. . Adhere to recommended cardiac prudent/heart healthy diet. . Follow recommended safety measures to prevent falls and  injuries.  Interventions:  . Inter-disciplinary care team collaboration (see longitudinal plan of care) . Reviewed medications and indications for use. Encouraged to continue taking medications as prescribed. Encouraged to contact care management team with concerns regarding prescription costs. . Provided information regarding established blood pressure parameters and indications for notifying provider. Encouraged to monitor a few times a week if unable to monitor daily and record readings. Reports recent reading of 140/89. Marland Kitchen Discussed current nutritional intake and heart healthy options. Encouraged to continue diet modifications to increase adherence with a cardiac prudent diet. Reports making several diet modifications and attempting to adhere to recommended diet. Eats fish most days with very limited intake of pork and red meat. Also attempting to decrease alcohol consumption. Encouraged to read nutritional labels and monitor consumption of sodium, added sugar and saturated fats. . Discussed current activity level. Reviewed safety and fall prevention measures. Encouraged to continue mild activity and exercises as tolerated to improve heart health. Encouraged to keep pathways clear and well lit to prevent falls and injuries. Encouraged to stay hydrated and avoid strenuous activity during hot weather to prevent heat exhaustion. . Reviewed scheduled/upcoming provider appointments. Encouraged to attend appointments as scheduled to prevent delays in care. Encouraged to contact care management team with concerns regarding transportation. . Discussed plans for ongoing care management and follow up. Provided direct contact information.     Patient Self Care Activities:  . Self administers medications  . Calls pharmacy for medication refills . Attends church or other social activities . Performs ADL's independently . Performs IADL's independently   Initial goal documentation       Depression  Screen PHQ 2/9 Scores 06/11/2020 04/16/2020 03/08/2020 12/14/2019 07/25/2019 07/03/2019 06/06/2019  PHQ - 2 Score 0 0 0 0 2 0 0  PHQ- 9 Score - 0 - 0 2 0 -    Fall Risk Fall Risk  06/11/2020 04/16/2020 03/08/2020 12/14/2019 07/25/2019  Falls in the past year? 0 0 0 0 0  Number falls in past yr: 0 0 - 0 0  Injury with Fall? 0 0 - 0 0  Risk for fall due to : No Fall Risks - - - -  Follow up Falls prevention discussed - - - -    Any stairs in or around the home? Yes  If so, are there any without handrails? Yes - 3 steps outside  Home free of  loose throw rugs in walkways, pet beds, electrical cords, etc? Yes  Adequate lighting in your home to reduce risk of falls? Yes   ASSISTIVE DEVICES UTILIZED TO PREVENT FALLS:  Life alert? No  Use of a cane, walker or w/c? No  Grab bars in the bathroom? Yes  Shower chair or bench in shower? No  Elevated toilet seat or a handicapped toilet? Yes   TIMED UP AND GO:  Was the test performed? No .   Cognitive Function:     6CIT Screen 06/11/2020 06/06/2019  What Year? 0 points 0 points  What month? 0 points 0 points  What time? 0 points 0 points  Count back from 20 0 points 0 points  Months in reverse 0 points 0 points  Repeat phrase 4 points 4 points  Total Score 4 4    Immunizations Immunization History  Administered Date(s) Administered  . Fluad Quad(high Dose 65+) 05/15/2019, 06/04/2020  . Influenza, High Dose Seasonal PF 05/24/2018  . Influenza,inj,Quad PF,6+ Mos 06/19/2015, 06/02/2016, 07/09/2017  . Influenza-Unspecified 06/04/2013  . Moderna SARS-COVID-2 Vaccination 10/18/2019, 11/15/2019  . Pneumococcal Conjugate-13 05/24/2018  . Pneumococcal Polysaccharide-23 06/06/2019  . Tdap 03/26/2016  . Zoster 03/26/2016    TDAP status: Up to date   Flu Vaccine status: Up to date   Pneumococcal vaccine status: Up to date   Covid-19 vaccine status: Completed vaccines  Qualifies for Shingles Vaccine? Yes   Zostavax completed Yes    Shingrix Completed?: No.    Education has been provided regarding the importance of this vaccine. Patient has been advised to call insurance company to determine out of pocket expense if they have not yet received this vaccine. Advised may also receive vaccine at local pharmacy or Health Dept. Verbalized acceptance and understanding.  Screening Tests Health Maintenance  Topic Date Due  . TETANUS/TDAP  03/26/2026  . COLONOSCOPY  07/28/2026  . INFLUENZA VACCINE  Completed  . COVID-19 Vaccine  Completed  . Hepatitis C Screening  Completed  . PNA vac Low Risk Adult  Completed    Health Maintenance  There are no preventive care reminders to display for this patient.  Colorectal cancer screening: Completed 07/28/16. Repeat every 10 years  Lung Cancer Screening: (Low Dose CT Chest recommended if Age 48-80 years, 30 pack-year currently smoking OR have quit w/in 15years.) does not qualify.   Additional Screening:  Hepatitis C Screening: does qualify; Completed 06/02/18  Vision Screening: Recommended annual ophthalmology exams for early detection of glaucoma and other disorders of the eye. Is the patient up to date with their annual eye exam?  No  Who is the provider or what is the name of the office in which the patient attends annual eye exams? Not established If pt is not established with a provider, would they like to be referred to a provider to establish care? No .   Dental Screening: Recommended annual dental exams for proper oral hygiene  Community Resource Referral / Chronic Care Management: CRR required this visit?  No   CCM required this visit?  No       Plan:     I have personally reviewed and noted the following in the patient's chart:   . Medical and social history . Use of alcohol, tobacco or illicit drugs  . Current medications and supplements . Functional ability and status . Nutritional status . Physical activity . Advanced directives . List of other  physicians . Hospitalizations, surgeries, and ER visits in previous 12 months .  Vitals . Screenings to include cognitive, depression, and falls . Referrals and appointments  In addition, I have reviewed and discussed with patient certain preventive protocols, quality metrics, and best practice recommendations. A written personalized care plan for preventive services as well as general preventive health recommendations were provided to patient.     Carlos Marker, LPN   [id number]   Nurse Notes: none

## 2020-06-11 NOTE — Patient Instructions (Signed)
Carlos Conley , Thank you for taking time to come for your Medicare Wellness Visit. I appreciate your ongoing commitment to your health goals. Please review the following plan we discussed and let me know if I can assist you in the future.   Screening recommendations/referrals: Colonoscopy: done 07/28/16. Repeat in 2027 Recommended yearly ophthalmology/optometry visit for glaucoma screening and checkup Recommended yearly dental visit for hygiene and checkup  Vaccinations: Influenza vaccine: done 06/04/20 Pneumococcal vaccine: done 06/06/19 Tdap vaccine: done 03/26/16 Shingles vaccine: Shingrix discussed. Please contact your pharmacy for coverage information.  Covid-19:  Done 10/18/19 & 11/15/19  Advanced directives: Advance directive discussed with you today. I have provided a copy for you to complete at home and have notarized. Once this is complete please bring a copy in to our office so we can scan it into your chart.  Conditions/risks identified: Recommend drinking 6-8 glasses of water per day   Next appointment: Follow up in one year for your annual wellness visit.   Preventive Care 67 Years and Older, Male Preventive care refers to lifestyle choices and visits with your health care provider that can promote health and wellness. What does preventive care include?  A yearly physical exam. This is also called an annual well check.  Dental exams once or twice a year.  Routine eye exams. Ask your health care provider how often you should have your eyes checked.  Personal lifestyle choices, including:  Daily care of your teeth and gums.  Regular physical activity.  Eating a healthy diet.  Avoiding tobacco and drug use.  Limiting alcohol use.  Practicing safe sex.  Taking low doses of aspirin every day.  Taking vitamin and mineral supplements as recommended by your health care provider. What happens during an annual well check? The services and screenings done by your  health care provider during your annual well check will depend on your age, overall health, lifestyle risk factors, and family history of disease. Counseling  Your health care provider may ask you questions about your:  Alcohol use.  Tobacco use.  Drug use.  Emotional well-being.  Home and relationship well-being.  Sexual activity.  Eating habits.  History of falls.  Memory and ability to understand (cognition).  Work and work Statistician. Screening  You may have the following tests or measurements:  Height, weight, and BMI.  Blood pressure.  Lipid and cholesterol levels. These may be checked every 5 years, or more frequently if you are over 41 years old.  Skin check.  Lung cancer screening. You may have this screening every year starting at age 43 if you have a 30-pack-year history of smoking and currently smoke or have quit within the past 15 years.  Fecal occult blood test (FOBT) of the stool. You may have this test every year starting at age 75.  Flexible sigmoidoscopy or colonoscopy. You may have a sigmoidoscopy every 5 years or a colonoscopy every 10 years starting at age 41.  Prostate cancer screening. Recommendations will vary depending on your family history and other risks.  Hepatitis C blood test.  Hepatitis B blood test.  Sexually transmitted disease (STD) testing.  Diabetes screening. This is done by checking your blood sugar (glucose) after you have not eaten for a while (fasting). You may have this done every 1-3 years.  Abdominal aortic aneurysm (AAA) screening. You may need this if you are a current or former smoker.  Osteoporosis. You may be screened starting at age 67 if you are at high  risk. Talk with your health care provider about your test results, treatment options, and if necessary, the need for more tests. Vaccines  Your health care provider may recommend certain vaccines, such as:  Influenza vaccine. This is recommended every  year.  Tetanus, diphtheria, and acellular pertussis (Tdap, Td) vaccine. You may need a Td booster every 10 years.  Zoster vaccine. You may need this after age 52.  Pneumococcal 13-valent conjugate (PCV13) vaccine. One dose is recommended after age 26.  Pneumococcal polysaccharide (PPSV23) vaccine. One dose is recommended after age 89. Talk to your health care provider about which screenings and vaccines you need and how often you need them. This information is not intended to replace advice given to you by your health care provider. Make sure you discuss any questions you have with your health care provider. Document Released: 09/06/2015 Document Revised: 04/29/2016 Document Reviewed: 06/11/2015 Elsevier Interactive Patient Education  2017 [employer] Prevention in the Home Falls can cause injuries. They can happen to people of all ages. There are many things you can do to make your home safe and to help prevent falls. What can I do on the outside of my home?  Regularly fix the edges of walkways and driveways and fix any cracks.  Remove anything that might make you trip as you walk through a door, such as a raised step or threshold.  Trim any bushes or trees on the path to your home.  Use bright outdoor lighting.  Clear any walking paths of anything that might make someone trip, such as rocks or tools.  Regularly check to see if handrails are loose or broken. Make sure that both sides of any steps have handrails.  Any raised decks and porches should have guardrails on the edges.  Have any leaves, snow, or ice cleared regularly.  Use sand or salt on walking paths during winter.  Clean up any spills in your garage right away. This includes oil or grease spills. What can I do in the bathroom?  Use night lights.  Install grab bars by the toilet and in the tub and shower. Do not use towel bars as grab bars.  Use non-skid mats or decals in the tub or shower.  If you  need to sit down in the shower, use a plastic, non-slip stool.  Keep the floor dry. Clean up any water that spills on the floor as soon as it happens.  Remove soap buildup in the tub or shower regularly.  Attach bath mats securely with double-sided non-slip rug tape.  Do not have throw rugs and other things on the floor that can make you trip. What can I do in the bedroom?  Use night lights.  Make sure that you have a light by your bed that is easy to reach.  Do not use any sheets or blankets that are too big for your bed. They should not hang down onto the floor.  Have a firm chair that has side arms. You can use this for support while you get dressed.  Do not have throw rugs and other things on the floor that can make you trip. What can I do in the kitchen?  Clean up any spills right away.  Avoid walking on wet floors.  Keep items that you use a lot in easy-to-reach places.  If you need to reach something above you, use a strong step stool that has a grab bar.  Keep electrical cords out of the way.  Do not use floor polish or wax that makes floors slippery. If you must use wax, use non-skid floor wax.  Do not have throw rugs and other things on the floor that can make you trip. What can I do with my stairs?  Do not leave any items on the stairs.  Make sure that there are handrails on both sides of the stairs and use them. Fix handrails that are broken or loose. Make sure that handrails are as long as the stairways.  Check any carpeting to make sure that it is firmly attached to the stairs. Fix any carpet that is loose or worn.  Avoid having throw rugs at the top or bottom of the stairs. If you do have throw rugs, attach them to the floor with carpet tape.  Make sure that you have a light switch at the top of the stairs and the bottom of the stairs. If you do not have them, ask someone to add them for you. What else can I do to help prevent falls?  Wear shoes  that:  Do not have high heels.  Have rubber bottoms.  Are comfortable and fit you well.  Are closed at the toe. Do not wear sandals.  If you use a stepladder:  Make sure that it is fully opened. Do not climb a closed stepladder.  Make sure that both sides of the stepladder are locked into place.  Ask someone to hold it for you, if possible.  Clearly mark and make sure that you can see:  Any grab bars or handrails.  First and last steps.  Where the edge of each step is.  Use tools that help you move around (mobility aids) if they are needed. These include:  Canes.  Walkers.  Scooters.  Crutches.  Turn on the lights when you go into a dark area. Replace any light bulbs as soon as they burn out.  Set up your furniture so you have a clear path. Avoid moving your furniture around.  If any of your floors are uneven, fix them.  If there are any pets around you, be aware of where they are.  Review your medicines with your doctor. Some medicines can make you feel dizzy. This can increase your chance of falling. Ask your doctor what other things that you can do to help prevent falls. This information is not intended to replace advice given to you by your health care provider. Make sure you discuss any questions you have with your health care provider. Document Released: 06/06/2009 Document Revised: 01/16/2016 Document Reviewed: 09/14/2014 Elsevier Interactive Patient Education  2017 [employer].

## 2020-06-17 ENCOUNTER — Telehealth: Payer: Self-pay

## 2020-06-17 NOTE — Telephone Encounter (Signed)
°  Chronic Care Management   Outreach Note  06/17/2020 Name: Carlos Conley MRN: [id number] DOB: Feb 19, 1951  Primary Care Provider: Steele Sizer, MD Reason for referral : Chronic Care Management   An unsuccessful telephone outreach was attempted today. [name] is currently enrolled in the chronic care management program. Per spouse, he was not available at the time of the call. Agreed to relay message.    Follow Up Plan: Anticipate outreach within the next week.    Cristy [facility] [phone]

## 2020-07-26 ENCOUNTER — Ambulatory Visit: Payer: Self-pay

## 2020-07-26 NOTE — Chronic Care Management (AMB) (Signed)
  Chronic Care Management   Outreach Note  07/26/2020 Name: Carlos Conley MRN: [id number] DOB: [birth year]  Primary Care Provider: Steele Sizer, MD Reason for referral : Chronic Care Management    [name] was referred to the care management team for assistance with chronic care management and care coordination. His primary care provider will be notified of our unsuccessful attempts to maintain contact. The care management team will gladly outreach at any time in the future if he is interested in receiving assistance.    PLAN The care management team will gladly follow up with [name] after the primary care provider has a conversation with him regarding recommendation for care management engagement and subsequent re-referral for care management services.    [facility] [phone]

## 2020-10-16 NOTE — Progress Notes (Unsigned)
Name: Carlos Conley   MRN: [id number]    DOB: Aug 26, 1950   Date:10/17/2020       Progress Note  Subjective  Chief Complaint  Follow up   HPI    Hyperlipidemia:last time ASCVD was very high we started him on statin but he had hives with multiple statins in the past,  he is now on zetia and denies side effects, last LDL still high at 157 He has atherosclerosis of aorta seen on CT abdomen and pelvis. Discussed dietary modification and take aspirin 81 mg   Right hip pain; he has a hip replacement done by Dr. Rudene Christians in 05/2017, over the past month he has noticed a throbbing pain on right outer hip when he is active, stops within 5-10 minutes of rest. He states pain is bad enough to make him limp. He denies any radiation . We will refer him back to Dr. Rudene Christians  TKZ:SWFUXNATFTD [place] but states too expensive sincechange ininsurance,we changed toNorvasc 10  mg and Diovan 320/25 and bp at home has been in the 130's range . No chest pain, dizziness  or palpitation, no sob . Last labs normal sodium   History of prostate cancer:doing well, seeing Dr. Donella Stade yearly now. Last visit was March 2021, not due for repeat PSA yet    Seizures: he states childhood history of seizures, started before kindergarten,  one episode in middle school, he was on anti-seizure medication through elementary school. He states usually triggered by diet. He had some syncopal episodes in July 2020 , one of the episodes he bit  his tongue and wife took him to [facility], he followed up with a neurologist in [place] and anti-seizure medication was stopped.He has one episode of syncope about one month ago, he was going to bed and fell backwards. He did not bite his tongue. He states he was out for about one minute, he recalled falling, he felt sluggished afterwards, but went to bed , he woke up dizzy the following morning but resolved within one day. Discussed may have had another seizure, to monitor and may need to resume  anti-seizure medication. Also discussed risk of driving. Guidelines is not to drive for 6 months after a seizure episode. 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  No side effects of medications. Unchanged    Hyperglycemia: last A1C was at goal,  he has fatty liver, discussed healthier diet. He denies polyphagia, polydipsia or polyuria  Patient Active Problem List   Diagnosis Date Noted  . Hematuria 08/10/2019  . Atherosclerosis of aorta ([place]) 08/10/2019  . Fatty liver 08/10/2019  . History of total right hip replacement 08/11/2017  . Reflux esophagitis   . Benign neoplasm of transverse colon   . Hyperlipidemia 01/02/2016  . Essential hypertension 01/02/2016  . Hyperglycemia 01/02/2016  . ED (erectile dysfunction) of organic origin 02/16/2013  . Prostate cancer ([place]) 06/14/2012    Past Surgical History:  Procedure Laterality Date  . COLONOSCOPY WITH PROPOFOL N/A 07/28/2016   Procedure: COLONOSCOPY WITH PROPOFOL;  Surgeon: Lucilla Lame, MD;  Location: [facility] ENDOSCOPY;  Service: Endoscopy;  Laterality: N/A;  . PROSTATE SURGERY  2002  . PROSTATE SURGERY  2002  . TOTAL HIP ARTHROPLASTY Right 05/27/2017   Procedure: TOTAL HIP ARTHROPLASTY ANTERIOR APPROACH;  Surgeon: Hessie Knows, MD;  Location: [facility] ORS;  Service: Orthopedics;  Laterality: Right;    Family History  Problem  Relation Age of Onset  . Cancer Father   . Leukemia Father   . Hypertension Sister   . Hypertension Brother   . Hypertension Sister   . Hypertension Sister     Social History   Tobacco Use  . Smoking status: Never Smoker  . Smokeless tobacco: Never Used  Substance Use Topics  . Alcohol use: Yes    Alcohol/week: 2.0 - 3.0 standard drinks    Types: 2 - 3 Cans of beer per week    Comment: daily     Current Outpatient Medications:  .  amLODipine (NORVASC) 10 MG tablet, Take 1 tablet (10 mg total) by mouth  daily., Disp: 90 tablet, Rfl: 1 .  aspirin 81 MG EC tablet, TAKE 1 TABLET BY MOUTH EVERY DAY, Disp: 90 tablet, Rfl: 3 .  ezetimibe (ZETIA) 10 MG tablet, Take 1 tablet (10 mg total) by mouth daily., Disp: 90 tablet, Rfl: 1 .  sildenafil (VIAGRA) 100 MG tablet, Take 1 tablet (100 mg total) by mouth daily as needed for erectile dysfunction., Disp: 30 tablet, Rfl: 0 .  valsartan-hydrochlorothiazide (DIOVAN-HCT) 320-25 MG tablet, Take 1 tablet by mouth daily., Disp: 90 tablet, Rfl: 1 .  acetaminophen (TYLENOL) 500 MG tablet, Take 1 tablet (500 mg total) by mouth every 6 (six) hours as needed. (Patient not taking: Reported on 10/17/2020), Disp: 30 tablet, Rfl: 0  Allergies  Allergen Reactions  . Lipitor [Atorvastatin] Rash  . Pravastatin Itching, Rash and Other (See Comments)    Redness from feet up to knees.  . Simvastatin Itching and Rash    I personally reviewed active problem list, medication list, allergies, family history, social history, health maintenance with the patient/caregiver today.   ROS  Constitutional: Negative for fever or weight change.  Respiratory: Negative for cough and shortness of breath.   Cardiovascular: Negative for chest pain or palpitations.  Gastrointestinal: Negative for abdominal pain, no bowel changes.  Musculoskeletal: Negative for gait problem or joint swelling.  Skin: Negative for rash.  Neurological: Negative for dizziness or headache.  No other specific complaints in a complete review of systems (except as listed in HPI above).  Objective  Vitals:   10/17/20 0828  BP: 136/80  Pulse: 87  Resp: 16  Temp: 98 F (36.7 C)  TempSrc: Oral  SpO2: 98%  Weight: 212 lb (96.2 kg)  Height: 5\' 7"  (1.702 m)    Body mass index is 33.2 kg/m.  Physical Exam  Constitutional: Patient appears well-developed and well-nourished. Obese  No distress.  HEENT: head atraumatic, normocephalic, pupils equal and reactive to light, neck supple Cardiovascular: Normal  rate, regular rhythm and normal heart sounds.  No murmur heard. No BLE edema. Pulmonary/Chest: Effort normal and breath sounds normal. No respiratory distress. Abdominal: Soft.  There is no tenderness. Psychiatric: Patient has a normal mood and affect. behavior is normal. Judgment and thought content normal.   PHQ2/9: Depression screen [facility] 2/9 10/17/2020 06/11/2020 04/16/2020 03/08/2020 12/14/2019  Decreased Interest 0 0 0 0 0  Down, Depressed, Hopeless 0 0 0 0 0  PHQ - 2 Score 0 0 0 0 0  Altered sleeping - - 0 - 0  Tired, decreased energy - - 0 - 0  Change in appetite - - 0 - 0  Feeling bad or failure about yourself  - - 0 - 0  Trouble concentrating - - 0 - 0  Moving slowly or fidgety/restless - - 0 - 0  Suicidal thoughts - - 0 - 0  PHQ-9 Score - - 0 - 0  Difficult doing work/chores - - - - Not difficult at all  Some recent data might be hidden    phq 9 is negative   Fall Risk: Fall Risk  10/17/2020 06/11/2020 04/16/2020 03/08/2020 12/14/2019  Falls in the past year? 1 0 0 0 0  Number falls in past yr: 0 0 0 - 0  Injury with Fall? 1 0 0 - 0  Risk for fall due to : - No Fall Risks - - -  Follow up - Falls prevention discussed - - -     Functional Status Survey: Is the patient deaf or have difficulty hearing?: No Does the patient have difficulty seeing, even when wearing glasses/contacts?: No Does the patient have difficulty concentrating, remembering, or making decisions?: No Does the patient have difficulty walking or climbing stairs?: No Does the patient have difficulty dressing or bathing?: No Does the patient have difficulty doing errands alone such as visiting a doctor's office or shopping?: No   Assessment & Plan  1. Atherosclerosis of aorta ([place])  On zetia   2. Pure hypercholesterolemia  - ezetimibe (ZETIA) 10 MG tablet; Take 1 tablet (10 mg total) by mouth daily.  Dispense: 90 tablet; Refill: 1  3. History of prostate cancer   4. Seizures ([place])   5.  Essential hypertension  - amLODipine (NORVASC) 10 MG tablet; Take 1 tablet (10 mg total) by mouth daily.  Dispense: 90 tablet; Refill: 1 - valsartan-hydrochlorothiazide (DIOVAN-HCT) 320-25 MG tablet; Take 1 tablet by mouth daily.  Dispense: 90 tablet; Refill: 1  6. Erectile dysfunction following radical prostatectomy  - sildenafil (VIAGRA) 100 MG tablet; Take 1 tablet (100 mg total) by mouth daily as needed for erectile dysfunction.  Dispense: 30 tablet; Refill: 0  7. Statin intolerance   8. Hyperglycemia

## 2020-10-17 ENCOUNTER — Ambulatory Visit (INDEPENDENT_AMBULATORY_CARE_PROVIDER_SITE_OTHER): Payer: Medicare Other | Admitting: Family Medicine

## 2020-10-17 ENCOUNTER — Encounter: Payer: Self-pay | Admitting: Family Medicine

## 2020-10-17 ENCOUNTER — Other Ambulatory Visit: Payer: Self-pay

## 2020-10-17 VITALS — BP 136/80 | HR 87 | Temp 98.0°F | Resp 16 | Ht 67.0 in | Wt 212.0 lb

## 2020-10-17 DIAGNOSIS — Z8546 Personal history of malignant neoplasm of prostate: Secondary | ICD-10-CM | POA: Diagnosis not present

## 2020-10-17 DIAGNOSIS — Z789 Other specified health status: Secondary | ICD-10-CM

## 2020-10-17 DIAGNOSIS — E7800 Pure hypercholesterolemia, unspecified: Secondary | ICD-10-CM | POA: Diagnosis not present

## 2020-10-17 DIAGNOSIS — R569 Unspecified convulsions: Secondary | ICD-10-CM | POA: Diagnosis not present

## 2020-10-17 DIAGNOSIS — R739 Hyperglycemia, unspecified: Secondary | ICD-10-CM

## 2020-10-17 DIAGNOSIS — I700 Atherosclerosis of aorta: Secondary | ICD-10-CM

## 2020-10-17 DIAGNOSIS — I10 Essential (primary) hypertension: Secondary | ICD-10-CM

## 2020-10-17 DIAGNOSIS — N5231 Erectile dysfunction following radical prostatectomy: Secondary | ICD-10-CM

## 2020-10-17 MED ORDER — EZETIMIBE 10 MG PO TABS: 10.0000 mg | ORAL_TABLET | Freq: Every day | ORAL | 1 refills | Status: DC

## 2020-10-17 MED ORDER — SILDENAFIL CITRATE 100 MG PO TABS: 100.0000 mg | ORAL_TABLET | Freq: Every day | ORAL | 0 refills | Status: DC | PRN

## 2020-10-17 MED ORDER — VALSARTAN-HYDROCHLOROTHIAZIDE 320-25 MG PO TABS: 1.0000 | ORAL_TABLET | Freq: Every day | ORAL | 1 refills | Status: DC

## 2020-10-17 MED ORDER — AMLODIPINE BESYLATE 10 MG PO TABS: 10.0000 mg | ORAL_TABLET | Freq: Every day | ORAL | 1 refills | Status: DC

## 2020-10-29 ENCOUNTER — Inpatient Hospital Stay: Payer: Medicare Other | Attending: Radiation Oncology

## 2020-10-29 DIAGNOSIS — C61 Malignant neoplasm of prostate: Secondary | ICD-10-CM | POA: Diagnosis not present

## 2020-10-29 LAB — PSA: Prostatic Specific Antigen: 0.02 ng/mL (ref 0.00–4.00)

## 2020-11-04 ENCOUNTER — Ambulatory Visit
Admission: RE | Admit: 2020-11-04 | Discharge: 2020-11-04 | Disposition: A | Discharge status: Home / Self Care | Payer: Medicare Other | Source: Ambulatory Visit | Attending: Radiation Oncology | Admitting: Radiation Oncology

## 2020-11-04 ENCOUNTER — Other Ambulatory Visit: Payer: Self-pay

## 2020-11-04 ENCOUNTER — Other Ambulatory Visit: Payer: Self-pay | Admitting: Licensed Clinical Social Worker

## 2020-11-04 ENCOUNTER — Encounter: Payer: Self-pay | Admitting: Radiation Oncology

## 2020-11-04 VITALS — Temp 97.0°F | Wt 209.0 lb

## 2020-11-04 DIAGNOSIS — C61 Malignant neoplasm of prostate: Secondary | ICD-10-CM

## 2020-11-04 NOTE — Progress Notes (Signed)
Radiation Oncology Follow up Note  Name: Carlos Conley   Date:   11/04/2020 MRN:  [id number] DOB: Feb 27, 1951    This 70 y.o. male presents to the clinic today for 3-1/2-year follow-up status post salvage radiation therapy for adenocarcinoma of the prostate spelt status post prostatectomy 2003.  REFERRING PROVIDER: Steele Sizer, MD  HPI: Patient is a 70 year old male now out 3-1/2 years having completed salvage radiation therapy status post prostatectomy 2003 seen today in routine follow-up he is doing well specifically denies any increased lower urinary tract symptoms diarrhea or fatigue.  His PSA continues.  To be stable at 0.02 showing excellent biochemical control of his disease.  COMPLICATIONS OF TREATMENT: none  FOLLOW UP COMPLIANCE: keeps appointments   PHYSICAL EXAM:  Temp (!) 97 F (36.1 C) (Tympanic)   Wt 209 lb (94.8 kg)   BMI 32.73 kg/m  Well-developed well-nourished patient in NAD. HEENT reveals PERLA, EOMI, discs not visualized.  Oral cavity is clear. No oral mucosal lesions are identified. Neck is clear without evidence of cervical or supraclavicular adenopathy. Lungs are clear to A&P. Cardiac examination is essentially unremarkable with regular rate and rhythm without murmur rub or thrill. Abdomen is benign with no organomegaly or masses noted. Motor sensory and DTR levels are equal and symmetric in the upper and lower extremities. Cranial nerves II through XII are grossly intact. Proprioception is intact. No peripheral adenopathy or edema is identified. No motor or sensory levels are noted. Crude visual fields are within normal range.  RADIOLOGY RESULTS: No current films to review  PLAN: Present time patient is under excellent biochemical control of his prostate cancer I will see him back in 1 year for follow-up.  Should his PSA remains stable at that time will discontinue follow-up care and refer him back to his PMD or urology for continuation of PSA checks.  Patient  knows to call with any concerns.  I would like to take this opportunity to thank you for allowing me to participate in the care of your patient.Noreene Filbert, MD

## 2020-12-20 ENCOUNTER — Ambulatory Visit (INDEPENDENT_AMBULATORY_CARE_PROVIDER_SITE_OTHER): Payer: Medicare Other | Admitting: Family Medicine

## 2020-12-20 ENCOUNTER — Encounter: Payer: Self-pay | Admitting: Family Medicine

## 2020-12-20 DIAGNOSIS — J301 Allergic rhinitis due to pollen: Secondary | ICD-10-CM

## 2020-12-20 DIAGNOSIS — J40 Bronchitis, not specified as acute or chronic: Secondary | ICD-10-CM | POA: Diagnosis not present

## 2020-12-20 MED ORDER — LEVOCETIRIZINE DIHYDROCHLORIDE 5 MG PO TABS: 5.0000 mg | ORAL_TABLET | Freq: Every evening | ORAL | 1 refills | Status: DC

## 2020-12-20 MED ORDER — PREDNISONE 10 MG PO TABS
10.0000 mg | ORAL_TABLET | Freq: Two times a day (BID) | ORAL | 0 refills | Status: DC
Start: 2020-12-20 — End: 2021-03-06

## 2020-12-20 MED ORDER — TRELEGY ELLIPTA 100-62.5-25 MCG/INH IN AEPB: 1.0000 | INHALATION_SPRAY | Freq: Every day | RESPIRATORY_TRACT | 1 refills | Status: DC

## 2020-12-20 MED ORDER — BENZONATATE 100 MG PO CAPS: 100.0000 mg | ORAL_CAPSULE | Freq: Two times a day (BID) | ORAL | 0 refills | Status: DC | PRN

## 2020-12-20 MED ORDER — MONTELUKAST SODIUM 10 MG PO TABS: 10.0000 mg | ORAL_TABLET | Freq: Every day | ORAL | 1 refills | Status: DC

## 2020-12-20 NOTE — Progress Notes (Signed)
Name: Carlos Conley   MRN: [id number]    DOB: [birth year]   Date:12/20/2020       Progress Note  Subjective  Chief Complaint  Chief Complaint  Patient presents with  . Cough    X1 week    I connected with  [name] on 12/20/20 at 11:00 AM EDT by telephone and verified that I am speaking with the correct person using two identifiers.  I discussed the limitations, risks, security and privacy concerns of performing an evaluation and management service by telephone and the availability of in person appointments. Staff also discussed with the patient that there may be a patient responsible charge related to this service. Patient agreed on having a virtual visit  Patient Location: at home  Provider Location: [facility] Additional Individuals present: wife   HPI  Cough: he states he mowed his yard last night and since than he started to cough that evening. He states symptoms were worse last night, keeping him up during the night. He also has some wheezing but denies SOB. He states cough is now productive is white to yellowish in color. No fatigue, lack of appetite. No exposure to COVID-19, wife is not sick. He retired    Patient Active Problem List   Diagnosis Date Noted  . Hematuria 08/10/2019  . Atherosclerosis of aorta ([place]) 08/10/2019  . Fatty liver 08/10/2019  . History of total right hip replacement 08/11/2017  . Reflux esophagitis   . Benign neoplasm of transverse colon   . Hyperlipidemia 01/02/2016  . Essential hypertension 01/02/2016  . Hyperglycemia 01/02/2016  . ED (erectile dysfunction) of organic origin 02/16/2013    Past Surgical History:  Procedure Laterality Date  . COLONOSCOPY WITH PROPOFOL N/A 07/28/2016   Procedure: COLONOSCOPY WITH PROPOFOL;  Surgeon: Lucilla Lame, MD;  Location: [facility] ENDOSCOPY;  Service: Endoscopy;  Laterality: N/A;  . PROSTATE SURGERY  2002  . PROSTATE SURGERY  2002  . TOTAL HIP ARTHROPLASTY Right 05/27/2017   Procedure: TOTAL HIP  ARTHROPLASTY ANTERIOR APPROACH;  Surgeon: Hessie Knows, MD;  Location: [facility] ORS;  Service: Orthopedics;  Laterality: Right;    Family History  Problem Relation Age of Onset  . Cancer Father   . Leukemia Father   . Hypertension Sister   . Hypertension Brother   . Hypertension Sister   . Hypertension Sister     Social History   Socioeconomic History  . Marital status: Married    Spouse name: [name]  . Number of children: 3  . Years of education: Not on file  . Highest education level: Some college, no degree  Occupational History  . Occupation: Retired    Comment: Cytogeneticist at a plant   Tobacco Use  . Smoking status: Never Smoker  . Smokeless tobacco: Never Used  Vaping Use  . Vaping Use: Never used  Substance and Sexual Activity  . Alcohol use: Yes    Alcohol/week: 2.0 - 3.0 standard drinks    Types: 2 - 3 Cans of beer per week    Comment: daily  . Drug use: No  . Sexual activity: Yes    Partners: Female  Other Topics Concern  . Not on file  Social History Narrative   Retired April of 2016      Does not eat meat except for occasional fish   Social Determinants of Health   Financial Resource Strain: Low Risk   . Difficulty of Paying Living Expenses: Not hard at all  Food Insecurity: No  Food Insecurity  . Worried About Charity fundraiser in the Last Year: Never true  . Ran Out of Food in the Last Year: Never true  Transportation Needs: No Transportation Needs  . Lack of Transportation (Medical): No  . Lack of Transportation (Non-Medical): No  Physical Activity: Insufficiently Active  . Days of Exercise per Week: 5 days  . Minutes of Exercise per Session: 20 min  Stress: No Stress Concern Present  . Feeling of Stress : Not at all  Social Connections: Moderately Integrated  . Frequency of Communication with Friends and Family: More than three times a week  . Frequency of Social Gatherings with Friends and Family: More than three times a week  .  Attends Religious Services: More than 4 times per year  . Active Member of Clubs or Organizations: No  . Attends Archivist Meetings: Never  . Marital Status: Married  Human resources officer Violence: Not At Risk  . Fear of Current or Ex-Partner: No  . Emotionally Abused: No  . Physically Abused: No  . Sexually Abused: No     Current Outpatient Medications:  .  amLODipine (NORVASC) 10 MG tablet, Take 1 tablet (10 mg total) by mouth daily., Disp: 90 tablet, Rfl: 1 .  aspirin 81 MG EC tablet, TAKE 1 TABLET BY MOUTH EVERY DAY, Disp: 90 tablet, Rfl: 3 .  ezetimibe (ZETIA) 10 MG tablet, Take 1 tablet (10 mg total) by mouth daily., Disp: 90 tablet, Rfl: 1 .  sildenafil (VIAGRA) 100 MG tablet, Take 1 tablet (100 mg total) by mouth daily as needed for erectile dysfunction., Disp: 30 tablet, Rfl: 0 .  valsartan-hydrochlorothiazide (DIOVAN-HCT) 320-25 MG tablet, Take 1 tablet by mouth daily., Disp: 90 tablet, Rfl: 1 .  acetaminophen (TYLENOL) 500 MG tablet, Take 1 tablet (500 mg total) by mouth every 6 (six) hours as needed. (Patient not taking: No sig reported), Disp: 30 tablet, Rfl: 0  Allergies  Allergen Reactions  . Lipitor [Atorvastatin] Rash  . Pravastatin Itching, Rash and Other (See Comments)    Redness from feet up to knees.  . Simvastatin Itching and Rash    I personally reviewed active problem list, medication list, allergies, family history, social history with the patient/caregiver today.   ROS  Ten systems reviewed and is negative except as mentioned in HPI   Objective  Virtual encounter, vitals not obtained.  There is no height or weight on file to calculate BMI.  Physical Exam  Awake, alert and oriented   PHQ2/9: Depression screen [facility] 2/9 12/20/2020 10/17/2020 06/11/2020 04/16/2020 03/08/2020  Decreased Interest 0 0 0 0 0  Down, Depressed, Hopeless 0 0 0 0 0  PHQ - 2 Score 0 0 0 0 0  Altered sleeping - - - 0 -  Tired, decreased energy - - - 0 -  Change in  appetite - - - 0 -  Feeling bad or failure about yourself  - - - 0 -  Trouble concentrating - - - 0 -  Moving slowly or fidgety/restless - - - 0 -  Suicidal thoughts - - - 0 -  PHQ-9 Score - - - 0 -  Difficult doing work/chores - - - - -  Some recent data might be hidden   PHQ-2/9 Result is negative.    Fall Risk: Fall Risk  12/20/2020 10/17/2020 06/11/2020 04/16/2020 03/08/2020  Falls in the past year? 0 1 0 0 0  Number falls in past yr: 0 0 0 0 -  Injury with Fall? 0 1 0 0 -  Risk for fall due to : - - No Fall Risks - -  Follow up - - Falls prevention discussed - -     Assessment & Plan  1. Bronchitis  - Fluticasone-Umeclidin-Vilant (TRELEGY ELLIPTA) 100-62.5-25 MCG/INH AEPB; Inhale 1 puff into the lungs daily.  Dispense: 1 each; Refill: 1 - predniSONE (DELTASONE) 10 MG tablet; Take 1 tablet (10 mg total) by mouth 2 (two) times daily with a meal.  Dispense: 10 tablet; Refill: 0 - benzonatate (TESSALON) 100 MG capsule; Take 1 capsule (100 mg total) by mouth 2 (two) times daily as needed for cough.  Dispense: 40 capsule; Refill: 0  Advised to go to [facility] if worsening of symptoms over the weekend   2. Seasonal allergic rhinitis due to pollen  - montelukast (SINGULAIR) 10 MG tablet; Take 1 tablet (10 mg total) by mouth at bedtime.  Dispense: 30 tablet; Refill: 1  I discussed the assessment and treatment plan with the patient. The patient was provided an opportunity to ask questions and all were answered. The patient agreed with the plan and demonstrated an understanding of the instructions.   The patient was advised to call back or seek an in-person evaluation if the symptoms worsen or if the condition fails to improve as anticipated.  I provided 15 minutes of non-face-to-face time during this encounter.  Steele Sizer, MD

## 2021-01-11 ENCOUNTER — Other Ambulatory Visit: Payer: Self-pay | Admitting: Family Medicine

## 2021-01-11 DIAGNOSIS — J301 Allergic rhinitis due to pollen: Secondary | ICD-10-CM

## 2021-01-13 ENCOUNTER — Other Ambulatory Visit: Payer: Self-pay | Admitting: Family Medicine

## 2021-01-13 DIAGNOSIS — E7800 Pure hypercholesterolemia, unspecified: Secondary | ICD-10-CM

## 2021-01-13 NOTE — Telephone Encounter (Signed)
Pt informed that prescription has been sent to pharmacy

## 2021-02-19 ENCOUNTER — Emergency Department (HOSPITAL_COMMUNITY)
Admission: EM | Admit: 2021-02-19 | Discharge: 2021-02-19 | Disposition: A | Discharge status: Home / Self Care | Payer: Medicare Other | Attending: Emergency Medicine | Admitting: Emergency Medicine

## 2021-02-19 ENCOUNTER — Other Ambulatory Visit: Payer: Self-pay

## 2021-02-19 ENCOUNTER — Encounter (HOSPITAL_COMMUNITY): Payer: Self-pay

## 2021-02-19 DIAGNOSIS — Z79899 Other long term (current) drug therapy: Secondary | ICD-10-CM | POA: Insufficient documentation

## 2021-02-19 DIAGNOSIS — Z7982 Long term (current) use of aspirin: Secondary | ICD-10-CM | POA: Diagnosis not present

## 2021-02-19 DIAGNOSIS — I10 Essential (primary) hypertension: Secondary | ICD-10-CM | POA: Diagnosis not present

## 2021-02-19 DIAGNOSIS — Z202 Contact with and (suspected) exposure to infections with a predominantly sexual mode of transmission: Secondary | ICD-10-CM | POA: Insufficient documentation

## 2021-02-19 DIAGNOSIS — Z8546 Personal history of malignant neoplasm of prostate: Secondary | ICD-10-CM | POA: Diagnosis not present

## 2021-02-19 DIAGNOSIS — Z96641 Presence of right artificial hip joint: Secondary | ICD-10-CM | POA: Insufficient documentation

## 2021-02-19 DIAGNOSIS — R369 Urethral discharge, unspecified: Secondary | ICD-10-CM | POA: Diagnosis not present

## 2021-02-19 MED ORDER — CEFTRIAXONE SODIUM 500 MG IJ SOLR
500.0000 mg | Freq: Once | INTRAMUSCULAR | Status: AC
  Administered 2021-02-19: 500 mg via INTRAMUSCULAR
  Filled 2021-02-19: qty 500

## 2021-02-19 MED ORDER — DOXYCYCLINE HYCLATE 100 MG PO CAPS: 100.0000 mg | ORAL_CAPSULE | Freq: Two times a day (BID) | ORAL | 0 refills | Status: AC

## 2021-02-19 MED ORDER — DOXYCYCLINE HYCLATE 100 MG PO TABS
100.0000 mg | ORAL_TABLET | Freq: Once | ORAL | Status: AC
  Administered 2021-02-19: 100 mg via ORAL
  Filled 2021-02-19: qty 1

## 2021-02-19 MED ORDER — LIDOCAINE HCL (PF) 1 % IJ SOLN
1.0000 mL | Freq: Once | INTRAMUSCULAR | Status: AC
  Administered 2021-02-19: 1 mL
  Filled 2021-02-19: qty 30

## 2021-02-19 NOTE — ED Notes (Signed)
MD in room at time of triage to complete MSE.  

## 2021-02-19 NOTE — ED Provider Notes (Signed)
[facility] EMERGENCY DEPARTMENT Provider Note   CSN: [id number] Arrival date & time: 02/19/21  5643     History Chief Complaint  Patient presents with   Male GU Problem    Carlos Conley is a 70 y.o. male presenting to the ED with penile discharge and concern for STI exposure.  He reports an unprotected sex with a new male partner this weekend.  He states he has having white discharge from his penis.  He denies fevers or chills.  He denies any prior history of STD.  HPI     Past Medical History:  Diagnosis Date   Arthritis    Cancer [facility])    Prostate   Hyperlipidemia    Hypertension     Patient Active Problem List   Diagnosis Date Noted   Hematuria 08/10/2019   Atherosclerosis of aorta ([place]) 08/10/2019   Fatty liver 08/10/2019   History of total right hip replacement 08/11/2017   Reflux esophagitis    Benign neoplasm of transverse colon    Hyperlipidemia 01/02/2016   Essential hypertension 01/02/2016   Hyperglycemia 01/02/2016   ED (erectile dysfunction) of organic origin 02/16/2013    Past Surgical History:  Procedure Laterality Date   COLONOSCOPY WITH PROPOFOL N/A 07/28/2016   Procedure: COLONOSCOPY WITH PROPOFOL;  Surgeon: Lucilla Lame, MD;  Location: [facility] ENDOSCOPY;  Service: Endoscopy;  Laterality: N/A;   PROSTATE SURGERY  2002   PROSTATE SURGERY  2002   TOTAL HIP ARTHROPLASTY Right 05/27/2017   Procedure: TOTAL HIP ARTHROPLASTY ANTERIOR APPROACH;  Surgeon: Hessie Knows, MD;  Location: [facility] ORS;  Service: Orthopedics;  Laterality: Right;       Family History  Problem Relation Age of Onset   Cancer Father    Leukemia Father    Hypertension Sister    Hypertension Brother    Hypertension Sister    Hypertension Sister     Social History   Tobacco Use   Smoking status: Never   Smokeless tobacco: Never  Vaping Use   Vaping Use: Never used  Substance Use Topics   Alcohol use: Yes    Alcohol/week: 2.0 - 3.0 standard drinks    Types: 2 - 3  Cans of beer per week    Comment: daily   Drug use: No    Home Medications Prior to Admission medications   Medication Sig Start Date End Date Taking? Authorizing Provider  doxycycline (VIBRAMYCIN) 100 MG capsule Take 1 capsule (100 mg total) by mouth 2 (two) times daily for 7 days. 02/19/21 02/26/21 Yes Kendal Ghazarian, Carola Rhine, MD  amLODipine (NORVASC) 10 MG tablet Take 1 tablet (10 mg total) by mouth daily. 10/17/20   Steele Sizer, MD  ASPIRIN LOW DOSE 81 MG EC tablet TAKE 1 TABLET BY MOUTH EVERY DAY 01/13/21   Ancil Boozer, Drue Stager, MD  benzonatate (TESSALON) 100 MG capsule Take 1 capsule (100 mg total) by mouth 2 (two) times daily as needed for cough. 12/20/20   Steele Sizer, MD  ezetimibe (ZETIA) 10 MG tablet Take 1 tablet (10 mg total) by mouth daily. 10/17/20   Steele Sizer, MD  Fluticasone-Umeclidin-Vilant (TRELEGY ELLIPTA) 100-62.5-25 MCG/INH AEPB Inhale 1 puff into the lungs daily. 12/20/20   Steele Sizer, MD  levocetirizine (XYZAL) 5 MG tablet Take 1 tablet (5 mg total) by mouth every evening. 12/20/20   Steele Sizer, MD  montelukast (SINGULAIR) 10 MG tablet Take 1 tablet (10 mg total) by mouth at bedtime. 12/20/20   Steele Sizer, MD  predniSONE (DELTASONE) 10 MG tablet  Take 1 tablet (10 mg total) by mouth 2 (two) times daily with a meal. 12/20/20   Ancil Boozer, Drue Stager, MD  sildenafil (VIAGRA) 100 MG tablet Take 1 tablet (100 mg total) by mouth daily as needed for erectile dysfunction. 10/17/20   Steele Sizer, MD  valsartan-hydrochlorothiazide (DIOVAN-HCT) 320-25 MG tablet Take 1 tablet by mouth daily. 10/17/20   Steele Sizer, MD    Allergies    Lipitor [atorvastatin], Pravastatin, and Simvastatin  Review of Systems   Review of Systems  Constitutional:  Negative for chills and fever.  Cardiovascular:  Negative for chest pain and palpitations.  Gastrointestinal:  Negative for abdominal pain and vomiting.  Genitourinary:  Positive for penile discharge. Negative for dysuria and  hematuria.  Skin:  Negative for color change and rash.   Physical Exam Updated Vital Signs BP (!) 196/119 (BP Location: Left Arm)   Pulse 87   Temp 98 F (36.7 C) (Oral)   Resp 18   Ht 5\' 7"  (1.702 m)   Wt 90.7 kg   SpO2 97%   BMI 31.32 kg/m   Physical Exam Constitutional:      General: He is not in acute distress. HENT:     Head: Normocephalic and atraumatic.  Eyes:     Conjunctiva/sclera: Conjunctivae normal.     Pupils: Pupils are equal, round, and reactive to light.  Cardiovascular:     Rate and Rhythm: Normal rate and regular rhythm.  Pulmonary:     Effort: Pulmonary effort is normal. No respiratory distress.  Abdominal:     General: There is no distension.     Tenderness: There is no abdominal tenderness.  Skin:    General: Skin is warm and dry.  Neurological:     General: No focal deficit present.     Mental Status: He is alert. Mental status is at baseline.  Psychiatric:        Mood and Affect: Mood normal.        Behavior: Behavior normal.    ED Results / Procedures / Treatments   Labs (all labs ordered are listed, but only abnormal results are displayed) Labs Reviewed  GC/CHLAMYDIA PROBE AMP ([place]) NOT AT [facility]    EKG None  Radiology No results found.  Procedures Procedures   Medications Ordered in ED Medications  cefTRIAXone (ROCEPHIN) injection 500 mg (500 mg Intramuscular Given 02/19/21 1037)  lidocaine (PF) (XYLOCAINE) 1 % injection 1 mL (1 mL Other Given 02/19/21 1038)  doxycycline (VIBRA-TABS) tablet 100 mg (100 mg Oral Given 02/19/21 1036)    ED Course  I have reviewed the triage vital signs and the nursing notes.  Pertinent labs & imaging results that were available during my care of the patient were reviewed by me and considered in my medical decision making (see chart for details).  This patient presents to the Emergency Department with complaint of exposure or symptoms of sexually transmitted.   I ordered medication  Rocephin and doxycycline for STI - empiric treatment based on symptoms.    We'll send urine sample  Doubt HIV/syphilis with this type of exposure.  I discussed outpatient follow up with primary care provider, and advised follow up on their culture results, if sent.  I also advised sexual abstinence while awaiting test results, and encouraging all prior and current sexual partners to obtain testing and treatment if positive for STI on today's workup.   Return precautions were discussed with the patient.  I felt the patient was clinically stable for discharge.  Final Clinical Impression(s) / ED Diagnoses Final diagnoses:  Drainage from penis  Exposure to sexually transmitted disease (STD)    Rx / DC Orders ED Discharge Orders          Ordered    doxycycline (VIBRAMYCIN) 100 MG capsule  2 times daily        02/19/21 1027             Wyvonnia Dusky, MD 02/19/21 1701

## 2021-02-19 NOTE — ED Triage Notes (Signed)
Patient reports that he had unprotected sex this past weekend and is requesting STD check. Reports white, creamy penile discharge. Denies pain.

## 2021-02-19 NOTE — Discharge Instructions (Addendum)
Please take a few minutes to read these important discharge instructions below: You have been diagnosed with and treated for a presumed sexually transmitted infection such as chlamydia, gonorrhea, or trichomoniasis.  Chlamydia and gonorrhea are the most commonly reported communicable diseases in the [place]. They are especially common among people younger than 68 years of age. Infection without symptoms is common in men and women. Your partner may be infected but not display symptoms.  Complications of these infections without treatment include serious pelvic infections, ectopic pregnancy, and infertility. Other infections such as HIV or HPV, which can occur in patients with sexually transmitted infections, can lead to cancer or death. To minimize the spread of the infection, you should avoid sexual intercourse for 7 days or until symptoms have resolved.  To minimize the spread of the infection, you should advise your sexual partner(s) to be evaluated, tested and treated. This includes all sexual partners within the past 60 days or your last sexual partner if last contact was greater than 60 days.  To minimize the risk of reinfection, you should abstain from sexual intercourse until your sexual partners have been tested and treated.  Consistent condom use is important in preventing the spread of sexually transmitted infections.  Do you need to get re-tested? Studies reveal that infection with chlamydia or gonorrhea is common among those treated within the preceding several months. Testing for gonorrhea and chlamydia in approximately 3 months is recommended even if you believe your sexual partner has been treated. Based on your history or demographics, your doctor may feel that you are higher risk for HIV and syphilis.  Talk to your doctor about whether you should be tested for these diseases at this time.  When should you come back to the Emergency Department? If your symptoms have not  improved after 48 hours from when you received your antibiotics, you should contact your primary care doctor or return to the Emergency Department.  These may be signs of an infection that has not been fully treated. If you develop worsening pain, worsening discharge from your penis or vagina, fevers of greater than 100.4 F, or have any other new or alarming symptoms, you should return promptly to the Emergency Department.  You should follow up with your primary care doctor's office in 1 week to ensure that your symptoms have resolved.  

## 2021-02-20 LAB — GC/CHLAMYDIA PROBE AMP (~~LOC~~) NOT AT ARMC
Chlamydia: NEGATIVE
Comment: NEGATIVE
Comment: NORMAL
Neisseria Gonorrhea: NEGATIVE

## 2021-02-25 ENCOUNTER — Ambulatory Visit: Payer: Medicare Other | Admitting: Unknown Physician Specialty

## 2021-03-05 NOTE — Patient Instructions (Signed)
Preventive Care 65 Years and Older, Male Preventive care refers to lifestyle choices and visits with your health care provider that can promote health and wellness. This includes: A yearly physical exam. This is also called an annual wellness visit. Regular dental and eye exams. Immunizations. Screening for certain conditions. Healthy lifestyle choices, such as: Eating a healthy diet. Getting regular exercise. Not using drugs or products that contain nicotine and tobacco. Limiting alcohol use. What can I expect for my preventive care visit? Physical exam Your health care provider will check your: Height and weight. These may be used to calculate your BMI (body mass index). BMI is a measurement that tells if you are at a healthy weight. Heart rate and blood pressure. Body temperature. Skin for abnormal spots. Counseling Your health care provider may ask you questions about your: Past medical problems. Family's medical history. Alcohol, tobacco, and drug use. Emotional well-being. Home life and relationship well-being. Sexual activity. Diet, exercise, and sleep habits. History of falls. Memory and ability to understand (cognition). Work and work environment. Access to firearms. What immunizations do I need?  Vaccines are usually given at various ages, according to a schedule. Your health care provider will recommend vaccines for you based on your age, medicalhistory, and lifestyle or other factors, such as travel or where you work. What tests do I need? Blood tests Lipid and cholesterol levels. These may be checked every 5 years, or more often depending on your overall health. Hepatitis C test. Hepatitis B test. Screening Lung cancer screening. You may have this screening every year starting at age 55 if you have a 30-pack-year history of smoking and currently smoke or have quit within the past 15 years. Colorectal cancer screening. All adults should have this screening  starting at age 50 and continuing until age 75. Your health care provider may recommend screening at age 45 if you are at increased risk. You will have tests every 1-10 years, depending on your results and the type of screening test. Prostate cancer screening. Recommendations will vary depending on your family history and other risks. Genital exam to check for testicular cancer or hernias. Diabetes screening. This is done by checking your blood sugar (glucose) after you have not eaten for a while (fasting). You may have this done every 1-3 years. Abdominal aortic aneurysm (AAA) screening. You may need this if you are a current or former smoker. STD (sexually transmitted disease) testing, if you are at risk. Follow these instructions at home: Eating and drinking  Eat a diet that includes fresh fruits and vegetables, whole grains, lean protein, and low-fat dairy products. Limit your intake of foods with high amounts of sugar, saturated fats, and salt. Take vitamin and mineral supplements as recommended by your health care provider. Do not drink alcohol if your health care provider tells you not to drink. If you drink alcohol: Limit how much you have to 0-2 drinks a day. Be aware of how much alcohol is in your drink. In the [place] one drink equals one 12 oz bottle of beer (355 mL), one 5 oz glass of wine (148 mL), or one 1 oz glass of hard liquor (44 mL).  Lifestyle Take daily care of your teeth and gums. Brush your teeth every morning and night with fluoride toothpaste. Floss one time each day. Stay active. Exercise for at least 30 minutes 5 or more days each week. Do not use any products that contain nicotine or tobacco, such as cigarettes, e-cigarettes, and chewing tobacco.   If you need help quitting, ask your health care provider. Do not use drugs. If you are sexually active, practice safe sex. Use a condom or other form of protection to prevent STIs (sexually transmitted infections). Talk  with your health care provider about taking a low-dose aspirin or statin. Find healthy ways to cope with stress, such as: Meditation, yoga, or listening to music. Journaling. Talking to a trusted person. Spending time with friends and family. Safety Always wear your seat belt while driving or riding in a vehicle. Do not drive: If you have been drinking alcohol. Do not ride with someone who has been drinking. When you are tired or distracted. While texting. Wear a helmet and other protective equipment during sports activities. If you have firearms in your house, make sure you follow all gun safety procedures. What's next? Visit your health care provider once a year for an annual wellness visit. Ask your health care provider how often you should have your eyes and teeth checked. Stay up to date on all vaccines. This information is not intended to replace advice given to you by your health care provider. Make sure you discuss any questions you have with your healthcare provider. Document Revised: 05/09/2019 Document Reviewed: 08/04/2018 [employer]vier Patient Education  2022 [employer]  

## 2021-03-05 NOTE — Progress Notes (Signed)
Name: Carlos Conley   MRN: [id number]    DOB: 1951-06-16   Date:03/06/2021       Progress Note  Subjective  Chief Complaint  Annual Exam  HPI  Patient presents for annual CPE   IPSS Questionnaire (AUA-7): Over the past month.   1)  How often have you had a sensation of not emptying your bladder completely after you finish urinating?  0 - Not at all  2)  How often have you had to urinate again less than two hours after you finished urinating? 0 - Not at all  3)  How often have you found you stopped and started again several times when you urinated?  0 - Not at all  4) How difficult have you found it to postpone urination?  0 - Not at all  5) How often have you had a weak urinary stream?  0 - Not at all  6) How often have you had to push or strain to begin urination?  0 - Not at all  7) How many times did you most typically get up to urinate from the time you went to bed until the time you got up in the morning?  0 - None  Total score:  0-7 mildly symptomatic   8-19 moderately symptomatic   20-35 severely symptomatic     Diet: balanced diet  Exercise: needs to increase physical activity   Depression: phq 9 is negative Depression screen [facility] 2/9 03/06/2021 12/20/2020 10/17/2020 06/11/2020 04/16/2020  Decreased Interest 0 0 0 0 0  Down, Depressed, Hopeless 0 0 0 0 0  PHQ - 2 Score 0 0 0 0 0  Altered sleeping - - - - 0  Tired, decreased energy - - - - 0  Change in appetite - - - - 0  Feeling bad or failure about yourself  - - - - 0  Trouble concentrating - - - - 0  Moving slowly or fidgety/restless - - - - 0  Suicidal thoughts - - - - 0  PHQ-9 Score - - - - 0  Difficult doing work/chores - - - - -  Some recent data might be hidden    Hypertension:  BP Readings from Last 3 Encounters:  03/06/21 126/74  02/19/21 (!) 196/119  10/17/20 136/80    Obesity: Wt Readings from Last 3 Encounters:  03/06/21 204 lb (92.5 kg)  02/19/21 200 lb (90.7 kg)  11/04/20 209 lb (94.8 kg)    BMI Readings from Last 3 Encounters:  03/06/21 31.95 kg/m  02/19/21 31.32 kg/m  11/04/20 32.73 kg/m     Lipids:  Lab Results  Component Value Date   CHOL 230 (H) 04/16/2020   CHOL 218 (H) 07/03/2019   CHOL 239 (H) 06/02/2018   Lab Results  Component Value Date   HDL 55 04/16/2020   HDL 54 07/03/2019   HDL 50 06/02/2018   Lab Results  Component Value Date   LDLCALC 157 (H) 04/16/2020   LDLCALC 143 (H) 07/03/2019   LDLCALC 169 (H) 06/02/2018   Lab Results  Component Value Date   TRIG 79 04/16/2020   TRIG 97 07/03/2019   TRIG 91 06/02/2018   Lab Results  Component Value Date   CHOLHDL 4.2 04/16/2020   CHOLHDL 4.0 07/03/2019   CHOLHDL 4.8 06/02/2018   No results found for: LDLDIRECT Glucose:  Glucose, Bld  Date Value Ref Range Status  06/04/2020 102 (H) 65 - 99 mg/dL Final    Comment:    .  Fasting reference interval . For someone without known diabetes, a glucose value between 100 and 125 mg/dL is consistent with prediabetes and should be confirmed with a follow-up test. .   04/16/2020 92 65 - 99 mg/dL Final    Comment:    .            Fasting reference interval .   07/03/2019 102 (H) 65 - 99 mg/dL Final    Comment:    .            Fasting reference interval . For someone without known diabetes, a glucose value between 100 and 125 mg/dL is consistent with prediabetes and should be confirmed with a follow-up test. .    Glucose-Capillary  Date Value Ref Range Status  03/18/2019 147 (H) 70 - 99 mg/dL Final    [facility] Office Visit from 03/06/2021 in [facility]  AUDIT-C Score 5       Married STD testing and prevention (HIV/chl/gon/syphilis): he had intercourse with a male partner without condoms and was tested for [facility] and it was negative, but they did not check for HIV or RPR Hep C: 06/02/18  Skin cancer: Discussed monitoring for atypical lesions Colorectal cancer: 07/28/16 Prostate cancer:   Lab Results  Component Value Date   PSA <0.1 07/25/2019   PSA 0.06 10/22/2016   PSA 0.09 10/21/2015     Lung cancer:   Low Dose CT Chest recommended if Age 17-80 years, 30 pack-year currently smoking OR have quit w/in 15years. Patient does not qualify.   AAA: had CT abdomen in 2020  ECG:  03/18/19  Vaccines:   Shingrix: had Zostavax in the past but Shingrix is too costly even with insurance  Pneumonia: up to date  Flu: educated and discussed with patient.  Advanced Care Planning: A voluntary discussion about advance care planning including the explanation and discussion of advance directives.  Discussed health care proxy and Living will, and the patient was able to identify a health care proxy as Phineas Real - daughter .    Patient Active Problem List   Diagnosis Date Noted   Hematuria 08/10/2019   Atherosclerosis of aorta ([place]) 08/10/2019   Fatty liver 08/10/2019   History of total right hip replacement 08/11/2017   Reflux esophagitis    Benign neoplasm of transverse colon    Hyperlipidemia 01/02/2016   Essential hypertension 01/02/2016   Hyperglycemia 01/02/2016   ED (erectile dysfunction) of organic origin 02/16/2013    Past Surgical History:  Procedure Laterality Date   COLONOSCOPY WITH PROPOFOL N/A 07/28/2016   Procedure: COLONOSCOPY WITH PROPOFOL;  Surgeon: Lucilla Lame, MD;  Location: [facility] ENDOSCOPY;  Service: Endoscopy;  Laterality: N/A;   PROSTATE SURGERY  2002   PROSTATE SURGERY  2002   TOTAL HIP ARTHROPLASTY Right 05/27/2017   Procedure: TOTAL HIP ARTHROPLASTY ANTERIOR APPROACH;  Surgeon: Hessie Knows, MD;  Location: [facility] ORS;  Service: Orthopedics;  Laterality: Right;    Family History  Problem Relation Age of Onset   Cancer Father    Leukemia Father    Hypertension Sister    Hypertension Brother    Hypertension Sister    Hypertension Sister     Social History   Socioeconomic History   Marital status: Married    Spouse name: [name]   Number of children:  3   Years of education: Not on file   Highest education level: Some college, no degree  Occupational History   Occupation: Retired  Comment: Cytogeneticist at a plant   Tobacco Use   Smoking status: Never   Smokeless tobacco: Never  Vaping Use   Vaping Use: Never used  Substance and Sexual Activity   Alcohol use: Yes    Alcohol/week: 2.0 - 3.0 standard drinks    Types: 2 - 3 Cans of beer per week    Comment: daily   Drug use: No   Sexual activity: Yes    Partners: Female  Other Topics Concern   Not on file  Social History Narrative   Retired April of 2016      Does not eat meat except for occasional fish   Social Determinants of Radio broadcast assistant Strain: Low Risk    Difficulty of Paying Living Expenses: Not hard at all  Food Insecurity: No Food Insecurity   Worried About Charity fundraiser in the Last Year: Never true   Arboriculturist in the Last Year: Never true  Transportation Needs: No Transportation Needs   Lack of Transportation (Medical): No   Lack of Transportation (Non-Medical): No  Physical Activity: Insufficiently Active   Days of Exercise per Week: 5 days   Minutes of Exercise per Session: 20 min  Stress: No Stress Concern Present   Feeling of Stress : Not at all  Social Connections: Moderately Integrated   Frequency of Communication with Friends and Family: More than three times a week   Frequency of Social Gatherings with Friends and Family: More than three times a week   Attends Religious Services: More than 4 times per year   Active Member of [employer] or Organizations: No   Attends Archivist Meetings: Never   Marital Status: Married  Human resources officer Violence: Not At Risk   Fear of Current or Ex-Partner: No   Emotionally Abused: No   Physically Abused: No   Sexually Abused: No     Current Outpatient Medications:    amLODipine (NORVASC) 10 MG tablet, Take 1 tablet (10 mg total) by mouth daily., Disp: 90 tablet, Rfl:  1   ASPIRIN LOW DOSE 81 MG EC tablet, TAKE 1 TABLET BY MOUTH EVERY DAY, Disp: 90 tablet, Rfl: 0   ezetimibe (ZETIA) 10 MG tablet, Take 1 tablet (10 mg total) by mouth daily., Disp: 90 tablet, Rfl: 1   sildenafil (VIAGRA) 100 MG tablet, Take 1 tablet (100 mg total) by mouth daily as needed for erectile dysfunction., Disp: 30 tablet, Rfl: 0   valsartan-hydrochlorothiazide (DIOVAN-HCT) 320-25 MG tablet, Take 1 tablet by mouth daily. (Patient not taking: Reported on 03/06/2021), Disp: 90 tablet, Rfl: 1  Allergies  Allergen Reactions   Lipitor [Atorvastatin] Rash   Pravastatin Itching, Rash and Other (See Comments)    Redness from feet up to knees.   Simvastatin Itching and Rash     ROS  Constitutional: Negative for fever or weight change.  Respiratory: Negative for cough and shortness of breath.   Cardiovascular: Negative for chest pain or palpitations.  Gastrointestinal: Negative for abdominal pain, no bowel changes.  Musculoskeletal: Negative for gait problem or joint swelling.  Skin: Negative for rash.  Neurological: Negative for dizziness or headache.  No other specific complaints in a complete review of systems (except as listed in HPI above).    Objective  Vitals:   03/06/21 0759  BP: 126/74  Pulse: 84  Resp: 16  Temp: 98.4 F (36.9 C)  SpO2: 99%  Weight: 204 lb (92.5 kg)  Height: 5\' 7"  (1.702 m)  Body mass index is 31.95 kg/m.  Physical Exam  Constitutional: Patient appears well-developed and well-nourished. No distress.  HENT: Head: Normocephalic and atraumatic. Ears: B TMs ok, no erythema or effusion; Nose: Nose normal. Mouth/Throat: not done  Eyes: Conjunctivae and EOM are normal. Pupils are equal, round, and reactive to light. No scleral icterus.  Neck: Normal range of motion. Neck supple. No JVD present. No thyromegaly present.  Cardiovascular: Normal rate, regular rhythm and normal heart sounds.  No murmur heard. No BLE edema. Pulmonary/Chest: Effort  normal and breath sounds normal. No respiratory distress. Abdominal: Soft. Bowel sounds are normal, no distension. There is no tenderness. no masses MALE GENITALIA: Normal descended testes bilaterally, no masses palpated, no hernias, no lesions, no discharge RECTAL: not done , under the care of Dr. Donella Stade  Musculoskeletal: Normal range of motion, no joint effusions. No gross deformities Neurological: he is alert and oriented to person, place, and time. No cranial nerve deficit. Coordination, balance, strength, speech and gait are normal.  Skin: Skin is warm and dry. No rash noted. No erythema.  Psychiatric: Patient has a normal mood and affect. behavior is normal. Judgment and thought content normal.   Recent Results (from the past 2160 hour(s))  GC/Chlamydia probe amp     Status: None   Collection Time: 02/19/21 10:24 AM  Result Value Ref Range   Neisseria Gonorrhea Negative    Chlamydia Negative    Comment Normal Reference [place] Chlamydia - Negative    Comment      Normal Reference Range Neisseria Gonorrhea - Negative     Fall Risk: Fall Risk  03/06/2021 12/20/2020 10/17/2020 06/11/2020 04/16/2020  Falls in the past year? 0 0 1 0 0  Number falls in past yr: 0 0 0 0 0  Injury with Fall? 0 0 1 0 0  Risk for fall due to : - - - No Fall Risks -  Follow up - - - Falls prevention discussed -     Functional Status Survey: Is the patient deaf or have difficulty hearing?: No Does the patient have difficulty seeing, even when wearing glasses/contacts?: No Does the patient have difficulty concentrating, remembering, or making decisions?: No Does the patient have difficulty walking or climbing stairs?: No Does the patient have difficulty dressing or bathing?: No Does the patient have difficulty doing errands alone such as visiting a doctor's office or shopping?: No    Assessment & Plan  1. Well adult exam  - Lipid panel - COMPLETE METABOLIC PANEL WITH GFR - CBC with  Differential/Platelet - Hemoglobin A1c - RPR - HIV Antibody (routine testing w rflx)  2. Essential hypertension  - COMPLETE METABOLIC PANEL WITH GFR - CBC with Differential/Platelet  3. Erectile dysfunction following radical prostatectomy  - sildenafil (VIAGRA) 100 MG tablet; Take 1 tablet (100 mg total) by mouth daily as needed for erectile dysfunction.  Dispense: 30 tablet; Refill: 0  4. High risk heterosexual behavior  - RPR - HIV Antibody (routine testing w rflx)  5. Statin intolerance   6. Pure hypercholesterolemia  - Lipid panel  7. Hyperglycemia  - Hemoglobin A1c  8. Fatty liver  - COMPLETE METABOLIC PANEL WITH GFR  9. Atherosclerosis of aorta (HCC)  - Lipid panel     -Prostate cancer screening and PSA options (with potential risks and benefits of testing vs not testing) were discussed along with recent recs/guidelines. -USPSTF grade A and B recommendations reviewed with patient; age-appropriate recommendations, preventive care, screening tests, etc discussed and encouraged;  healthy living encouraged; see AVS for patient education given to patient -Discussed importance of 150 minutes of physical activity weekly, eat two servings of fish weekly, eat one serving of tree nuts ( cashews, pistachios, pecans, almonds.Marland Kitchen) every other day, eat 6 servings of fruit/vegetables daily and drink plenty of water and avoid sweet beverages.

## 2021-03-06 ENCOUNTER — Other Ambulatory Visit: Payer: Self-pay

## 2021-03-06 ENCOUNTER — Ambulatory Visit (INDEPENDENT_AMBULATORY_CARE_PROVIDER_SITE_OTHER): Payer: Medicare Other | Admitting: Family Medicine

## 2021-03-06 ENCOUNTER — Encounter: Payer: Self-pay | Admitting: Family Medicine

## 2021-03-06 VITALS — BP 126/74 | HR 84 | Temp 98.4°F | Resp 16 | Ht 67.0 in | Wt 204.0 lb

## 2021-03-06 DIAGNOSIS — N5231 Erectile dysfunction following radical prostatectomy: Secondary | ICD-10-CM | POA: Diagnosis not present

## 2021-03-06 DIAGNOSIS — Z0000 Encounter for general adult medical examination without abnormal findings: Secondary | ICD-10-CM | POA: Diagnosis not present

## 2021-03-06 DIAGNOSIS — I700 Atherosclerosis of aorta: Secondary | ICD-10-CM

## 2021-03-06 DIAGNOSIS — Z7251 High risk heterosexual behavior: Secondary | ICD-10-CM

## 2021-03-06 DIAGNOSIS — E7800 Pure hypercholesterolemia, unspecified: Secondary | ICD-10-CM

## 2021-03-06 DIAGNOSIS — K760 Fatty (change of) liver, not elsewhere classified: Secondary | ICD-10-CM

## 2021-03-06 DIAGNOSIS — Z789 Other specified health status: Secondary | ICD-10-CM

## 2021-03-06 DIAGNOSIS — R739 Hyperglycemia, unspecified: Secondary | ICD-10-CM

## 2021-03-06 DIAGNOSIS — I10 Essential (primary) hypertension: Secondary | ICD-10-CM

## 2021-03-06 MED ORDER — SILDENAFIL CITRATE 100 MG PO TABS: 100.0000 mg | ORAL_TABLET | Freq: Every day | ORAL | 0 refills | Status: DC | PRN

## 2021-03-10 LAB — COMPLETE METABOLIC PANEL WITH GFR
AG Ratio: 1.3 (calc) (ref 1.0–2.5)
ALT: 27 U/L (ref 9–46)
AST: 41 U/L — ABNORMAL HIGH (ref 10–35)
Albumin: 4.3 g/dL (ref 3.6–5.1)
Alkaline phosphatase (APISO): 75 U/L (ref 35–144)
BUN/Creatinine Ratio: 4 (calc) — ABNORMAL LOW (ref 6–22)
BUN: 4 mg/dL — ABNORMAL LOW (ref 7–25)
CO2: 31 mmol/L (ref 20–32)
Calcium: 10 mg/dL (ref 8.6–10.3)
Chloride: 96 mmol/L — ABNORMAL LOW (ref 98–110)
Creat: 1.1 mg/dL (ref 0.70–1.35)
Globulin: 3.2 g/dL (calc) (ref 1.9–3.7)
Glucose, Bld: 98 mg/dL (ref 65–99)
Potassium: 4.9 mmol/L (ref 3.5–5.3)
Sodium: 135 mmol/L (ref 135–146)
Total Bilirubin: 0.6 mg/dL (ref 0.2–1.2)
Total Protein: 7.5 g/dL (ref 6.1–8.1)
eGFR: 73 mL/min/{1.73_m2} (ref >=60)

## 2021-03-10 LAB — LIPID PANEL
Cholesterol: 219 mg/dL — ABNORMAL HIGH (ref <200)
HDL: 52 mg/dL (ref >=40)
LDL Cholesterol (Calc): 146 mg/dL (calc) — ABNORMAL HIGH
Non-HDL Cholesterol (Calc): 167 mg/dL (calc) — ABNORMAL HIGH (ref <130)
Total CHOL/HDL Ratio: 4.2 (calc) (ref <5.0)
Triglycerides: 100 mg/dL (ref <150)

## 2021-03-10 LAB — CBC WITH DIFFERENTIAL/PLATELET
Absolute Monocytes: 507 cells/uL (ref 200–950)
Basophils Absolute: 59 cells/uL (ref 0–200)
Basophils Relative: 0.9 %
Eosinophils Absolute: 130 cells/uL (ref 15–500)
Eosinophils Relative: 2 %
HCT: 47.5 % (ref 38.5–50.0)
Hemoglobin: 16 g/dL (ref 13.2–17.1)
Lymphs Abs: 1222 cells/uL (ref 850–3900)
MCH: 33.3 pg — ABNORMAL HIGH (ref 27.0–33.0)
MCHC: 33.7 g/dL (ref 32.0–36.0)
MCV: 98.8 fL (ref 80.0–100.0)
MPV: 8.8 fL (ref 7.5–12.5)
Monocytes Relative: 7.8 %
Neutro Abs: 4583 cells/uL (ref 1500–7800)
Neutrophils Relative %: 70.5 %
Platelets: 317 10*3/uL (ref 140–400)
RBC: 4.81 10*6/uL (ref 4.20–5.80)
RDW: 12.9 % (ref 11.0–15.0)
Total Lymphocyte: 18.8 %
WBC: 6.5 10*3/uL (ref 3.8–10.8)

## 2021-03-10 LAB — HEMOGLOBIN A1C
Hgb A1c MFr Bld: 5.4 % of total Hgb (ref <5.7)
Mean Plasma Glucose: 108 mg/dL
eAG (mmol/L): 6 mmol/L

## 2021-03-10 LAB — HIV ANTIBODY (ROUTINE TESTING W REFLEX): HIV 1&2 Ab, 4th Generation: NONREACTIVE

## 2021-03-10 LAB — RPR: RPR Ser Ql: REACTIVE — AB

## 2021-03-10 LAB — RPR TITER: RPR Titer: 1:2 {titer} — ABNORMAL HIGH

## 2021-03-10 LAB — FLUORESCENT TREPONEMAL AB(FTA)-IGG-BLD: Fluorescent Treponemal ABS: REACTIVE — AB

## 2021-03-11 NOTE — Progress Notes (Signed)
Lab results given. Pt aware of syphilis and that form is being faxed to health department. Answered all questions. Pt gave verbal understanding.

## 2021-03-17 ENCOUNTER — Telehealth: Payer: Self-pay

## 2021-03-17 NOTE — Telephone Encounter (Signed)
Copied from [place] [id number]. Topic: General - Inquiry >> Mar 17, 2021  8:06 AM Oneta Rack wrote: Reason for CRM: patient requesting to speak with PCP directly regarding lab results, please call mobile #

## 2021-03-18 ENCOUNTER — Ambulatory Visit: Payer: Self-pay | Admitting: Physician Assistant

## 2021-03-18 ENCOUNTER — Other Ambulatory Visit: Payer: Self-pay

## 2021-03-18 DIAGNOSIS — A539 Syphilis, unspecified: Secondary | ICD-10-CM

## 2021-03-18 DIAGNOSIS — Z113 Encounter for screening for infections with a predominantly sexual mode of transmission: Secondary | ICD-10-CM

## 2021-03-18 MED ORDER — DOXYCYCLINE HYCLATE 100 MG PO TABS: 100.0000 mg | ORAL_TABLET | Freq: Two times a day (BID) | ORAL | 0 refills | Status: AC

## 2021-03-20 ENCOUNTER — Encounter: Payer: Self-pay | Admitting: Physician Assistant

## 2021-03-20 NOTE — Progress Notes (Signed)
[facility] Department STI clinic/screening visit  Subjective:  Carlos Conley is a 70 y.o. male being seen today for an STI screening visit. The patient reports they do not have symptoms.    Patient has the following medical conditions:   Patient Active Problem List   Diagnosis Date Noted   Hematuria 08/10/2019   Atherosclerosis of aorta ([place]) 08/10/2019   Fatty liver 08/10/2019   History of total right hip replacement 08/11/2017   Reflux esophagitis    Benign neoplasm of transverse colon    Hyperlipidemia 01/02/2016   Essential hypertension 01/02/2016   Hyperglycemia 01/02/2016   ED (erectile dysfunction) of organic origin 02/16/2013     Chief Complaint  Patient presents with   SEXUALLY TRANSMITTED DISEASE    screening    HPI  Patient reports that he was sent here because he had a positive test for Syphilis at his PCP office.  Patient reports that he was treated at the ER on 02/19/2021 for GC with Ceftriaxone and Doxycycline.  States that he went to his PCP office about 2 weeks ago and had other testing done. Per chart review, patient with RPR reactive 1:2, FTA-ABS=reactive.  Patient denies any symptoms and states that he takes medicines as prescribed for HTN and reflux.  Reports last HIV test was 03/06/2021.  Patient declines any retesting today.    See flowsheet for further details and programmatic requirements.    The following portions of the patient's history were reviewed and updated as appropriate: allergies, current medications, past medical history, past social history, past surgical history and problem list.  Objective:  There were no vitals filed for this visit.  Physical Exam Constitutional:      General: He is not in acute distress.    Appearance: Normal appearance.  HENT:     Head: Normocephalic and atraumatic.  Eyes:     Conjunctiva/sclera: Conjunctivae normal.  Pulmonary:     Effort: Pulmonary effort is normal.  Skin:    General:  Skin is warm and dry.  Neurological:     Mental Status: He is alert and oriented to person, place, and time.  Psychiatric:        Mood and Affect: Mood normal.        Behavior: Behavior normal.        Thought Content: Thought content normal.        Judgment: Judgment normal.      Assessment and Plan:  Carlos Conley is a 70 y.o. male presenting to the [place] for STI screening  1. Screening for STD (sexually transmitted disease) Patient into clinic without symptoms. Call to [facility] re: patient to see if he has history of previous positive RPR.  Per DIS, patient had reactive RPR 1:4 back in 2013.  DIS states they have no history of confirmatory testing on that result.  States that it looks like his testing was done at a health fair. Rec condoms with all sex.   2. Syphilis Counseled patient extensively about Syphilis and that in 2013, they may have determined that he had a false positive test, so they did not inform him of the result since he would not have needed any treatment at that time. Will treat for Syphilis with Doxycycline 100 mg #28 1 po BID for 14 days. Counseled patient that in future, his RPR will be positive since he has had Syphilis and it has been treated. Enc patient to have his titer monitored annually  to make sure that he does not become reinfected. Call with questions or concerns. - doxycycline (VIBRA-TABS) 100 MG tablet; Take 1 tablet (100 mg total) by mouth 2 (two) times daily for 14 days.  Dispense: 28 tablet; Refill: 0     No follow-ups on file.  Future Appointments  Date Time Provider [place]  04/17/2021 10:00 AM Steele Sizer, MD [place] [facility]  06/12/2021  8:40 AM [place] ADVISOR [place] [facility]  10/31/2021  9:00 AM [facility] None  11/05/2021  9:00 AM Noreene Filbert, MD [facility] None  03/09/2022  8:00 AM Steele Sizer, MD [place], [place]

## 2021-03-25 NOTE — Progress Notes (Signed)
Chart reviewed by Pharmacist  Suzanne Walker PharmD, Contract Pharmacist at [facility]  

## 2021-04-11 ENCOUNTER — Other Ambulatory Visit: Payer: Self-pay | Admitting: Family Medicine

## 2021-04-11 DIAGNOSIS — E7800 Pure hypercholesterolemia, unspecified: Secondary | ICD-10-CM

## 2021-04-17 ENCOUNTER — Ambulatory Visit: Payer: Medicare Other | Admitting: Family Medicine

## 2021-06-12 ENCOUNTER — Ambulatory Visit (INDEPENDENT_AMBULATORY_CARE_PROVIDER_SITE_OTHER): Payer: Medicare Other

## 2021-06-12 DIAGNOSIS — Z0000 Encounter for general adult medical examination without abnormal findings: Secondary | ICD-10-CM | POA: Diagnosis not present

## 2021-06-12 NOTE — Patient Instructions (Signed)
Carlos Conley , Thank you for taking time to come for your Medicare Wellness Visit. I appreciate your ongoing commitment to your health goals. Please review the following plan we discussed and let me know if I can assist you in the future.   Screening recommendations/referrals: Colonoscopy: done 07/28/16; repeat 07/2026 Recommended yearly ophthalmology/optometry visit for glaucoma screening and checkup Recommended yearly dental visit for hygiene and checkup  Vaccinations: Influenza vaccine: due Pneumococcal vaccine: done 06/06/19 Tdap vaccine: done 03/26/16 Shingles vaccine: Shingrix discussed. Please contact your pharmacy for coverage information.  Covid-19: done 10/18/19, 11/15/19, 07/16/20 & 01/14/21  Advanced directives: Advance directive discussed with you today. I have provided a copy for you to complete at home and have notarized. Once this is complete please bring a copy in to our office so we can scan it into your chart.   Conditions/risks identified: Recommend drinking 6-8 glasses of water per day   Next appointment: Follow up in one year for your annual wellness visit.   Preventive Care 68 Years and Older, Male Preventive care refers to lifestyle choices and visits with your health care provider that can promote health and wellness. What does preventive care include? A yearly physical exam. This is also called an annual well check. Dental exams once or twice a year. Routine eye exams. Ask your health care provider how often you should have your eyes checked. Personal lifestyle choices, including: Daily care of your teeth and gums. Regular physical activity. Eating a healthy diet. Avoiding tobacco and drug use. Limiting alcohol use. Practicing safe sex. Taking low doses of aspirin every day. Taking vitamin and mineral supplements as recommended by your health care provider. What happens during an annual well check? The services and screenings done by your health care  provider during your annual well check will depend on your age, overall health, lifestyle risk factors, and family history of disease. Counseling  Your health care provider may ask you questions about your: Alcohol use. Tobacco use. Drug use. Emotional well-being. Home and relationship well-being. Sexual activity. Eating habits. History of falls. Memory and ability to understand (cognition). Work and work Statistician. Screening  You may have the following tests or measurements: Height, weight, and BMI. Blood pressure. Lipid and cholesterol levels. These may be checked every 5 years, or more frequently if you are over 43 years old. Skin check. Lung cancer screening. You may have this screening every year starting at age 63 if you have a 30-pack-year history of smoking and currently smoke or have quit within the past 15 years. Fecal occult blood test (FOBT) of the stool. You may have this test every year starting at age 68. Flexible sigmoidoscopy or colonoscopy. You may have a sigmoidoscopy every 5 years or a colonoscopy every 10 years starting at age 68. Prostate cancer screening. Recommendations will vary depending on your family history and other risks. Hepatitis C blood test. Hepatitis B blood test. Sexually transmitted disease (STD) testing. Diabetes screening. This is done by checking your blood sugar (glucose) after you have not eaten for a while (fasting). You may have this done every 1-3 years. Abdominal aortic aneurysm (AAA) screening. You may need this if you are a current or former smoker. Osteoporosis. You may be screened starting at age 33 if you are at high risk. Talk with your health care provider about your test results, treatment options, and if necessary, the need for more tests. Vaccines  Your health care provider may recommend certain vaccines, such as: Influenza vaccine. This  is recommended every year. Tetanus, diphtheria, and acellular pertussis (Tdap, Td)  vaccine. You may need a Td booster every 10 years. Zoster vaccine. You may need this after age 68. Pneumococcal 13-valent conjugate (PCV13) vaccine. One dose is recommended after age 68. Pneumococcal polysaccharide (PPSV23) vaccine. One dose is recommended after age 68. Talk to your health care provider about which screenings and vaccines you need and how often you need them. This information is not intended to replace advice given to you by your health care provider. Make sure you discuss any questions you have with your health care provider. Document Released: 09/06/2015 Document Revised: 04/29/2016 Document Reviewed: 06/11/2015 Elsevier Interactive Patient Education  2017 [place] Prevention in the Home Falls can cause injuries. They can happen to people of all ages. There are many things you can do to make your home safe and to help prevent falls. What can I do on the outside of my home? Regularly fix the edges of walkways and driveways and fix any cracks. Remove anything that might make you trip as you walk through a door, such as a raised step or threshold. Trim any bushes or trees on the path to your home. Use bright outdoor lighting. Clear any walking paths of anything that might make someone trip, such as rocks or tools. Regularly check to see if handrails are loose or broken. Make sure that both sides of any steps have handrails. Any raised decks and porches should have guardrails on the edges. Have any leaves, snow, or ice cleared regularly. Use sand or salt on walking paths during winter. Clean up any spills in your garage right away. This includes oil or grease spills. What can I do in the bathroom? Use night lights. Install grab bars by the toilet and in the tub and shower. Do not use towel bars as grab bars. Use non-skid mats or decals in the tub or shower. If you need to sit down in the shower, use a plastic, non-slip stool. Keep the floor dry. Clean up any  water that spills on the floor as soon as it happens. Remove soap buildup in the tub or shower regularly. Attach bath mats securely with double-sided non-slip rug tape. Do not have throw rugs and other things on the floor that can make you trip. What can I do in the bedroom? Use night lights. Make sure that you have a light by your bed that is easy to reach. Do not use any sheets or blankets that are too big for your bed. They should not hang down onto the floor. Have a firm chair that has side arms. You can use this for support while you get dressed. Do not have throw rugs and other things on the floor that can make you trip. What can I do in the kitchen? Clean up any spills right away. Avoid walking on wet floors. Keep items that you use a lot in easy-to-reach places. If you need to reach something above you, use a strong step stool that has a grab bar. Keep electrical cords out of the way. Do not use floor polish or wax that makes floors slippery. If you must use wax, use non-skid floor wax. Do not have throw rugs and other things on the floor that can make you trip. What can I do with my stairs? Do not leave any items on the stairs. Make sure that there are handrails on both sides of the stairs and use them. Fix handrails that  are broken or loose. Make sure that handrails are as long as the stairways. Check any carpeting to make sure that it is firmly attached to the stairs. Fix any carpet that is loose or worn. Avoid having throw rugs at the top or bottom of the stairs. If you do have throw rugs, attach them to the floor with carpet tape. Make sure that you have a light switch at the top of the stairs and the bottom of the stairs. If you do not have them, ask someone to add them for you. What else can I do to help prevent falls? Wear shoes that: Do not have high heels. Have rubber bottoms. Are comfortable and fit you well. Are closed at the toe. Do not wear sandals. If you use a  stepladder: Make sure that it is fully opened. Do not climb a closed stepladder. Make sure that both sides of the stepladder are locked into place. Ask someone to hold it for you, if possible. Clearly mark and make sure that you can see: Any grab bars or handrails. First and last steps. Where the edge of each step is. Use tools that help you move around (mobility aids) if they are needed. These include: Canes. Walkers. Scooters. Crutches. Turn on the lights when you go into a dark area. Replace any light bulbs as soon as they burn out. Set up your furniture so you have a clear path. Avoid moving your furniture around. If any of your floors are uneven, fix them. If there are any pets around you, be aware of where they are. Review your medicines with your doctor. Some medicines can make you feel dizzy. This can increase your chance of falling. Ask your doctor what other things that you can do to help prevent falls. This information is not intended to replace advice given to you by your health care provider. Make sure you discuss any questions you have with your health care provider. Document Released: 06/06/2009 Document Revised: 01/16/2016 Document Reviewed: 09/14/2014 Elsevier Interactive Patient Education  2017 [employer].

## 2021-06-12 NOTE — Progress Notes (Signed)
Subjective:   Carlos Conley is a 70 y.o. male who presents for Medicare Annual/Subsequent preventive examination.  Virtual Visit via Telephone Note  I connected with  Carlos Conley on 06/12/21 at  8:40 AM EDT by telephone and verified that I am speaking with the correct person using two identifiers.  Location: Patient: home Provider: [place] Persons participating in the virtual visit: [place]   I discussed the limitations, risks, security and privacy concerns of performing an evaluation and management service by telephone and the availability of in person appointments. The patient expressed understanding and agreed to proceed.  Interactive audio and video telecommunications were attempted between this nurse and patient, however failed, due to patient having technical difficulties OR patient did not have access to video capability.  We continued and completed visit with audio only.  Some vital signs may be absent or patient reported.   [name], LPN   Review of Systems     Cardiac Risk Factors include: advanced age (>57men, >74 women);dyslipidemia;male gender;hypertension     Objective:    There were no vitals filed for this visit. There is no height or weight on file to calculate BMI.  Advanced Directives 06/12/2021 02/19/2021 06/11/2020 06/06/2019 03/18/2019 11/07/2018 10/25/2017  Does Patient Have a Medical Advance Directive? No No No No No No No  Would patient like information on creating a medical advance directive? Yes (MAU/Ambulatory/Procedural Areas - Information given) No - Patient declined Yes (MAU/Ambulatory/Procedural Areas - Information given) Yes (MAU/Ambulatory/Procedural Areas - Information given) No - Patient declined No - Patient declined No - Patient declined    Current Medications (verified) Outpatient Encounter Medications as of 06/12/2021  Medication Sig   amLODipine (NORVASC) 10 MG tablet Take 1 tablet (10 mg total) by mouth  daily.   ASPIRIN LOW DOSE 81 MG EC tablet TAKE 1 TABLET BY MOUTH EVERY DAY   ezetimibe (ZETIA) 10 MG tablet Take 1 tablet (10 mg total) by mouth daily.   sildenafil (VIAGRA) 100 MG tablet Take 1 tablet (100 mg total) by mouth daily as needed for erectile dysfunction.   valsartan-hydrochlorothiazide (DIOVAN-HCT) 320-25 MG tablet Take 1 tablet by mouth daily.   No facility-administered encounter medications on file as of 06/12/2021.    Allergies (verified) Lipitor [atorvastatin], Pravastatin, and Simvastatin   History: Past Medical History:  Diagnosis Date   Arthritis    Cancer ([place])    Prostate   Hyperlipidemia    Hypertension    Past Surgical History:  Procedure Laterality Date   COLONOSCOPY WITH PROPOFOL N/A 07/28/2016   Procedure: COLONOSCOPY WITH PROPOFOL;  Surgeon: Lucilla Lame, MD;  Location: [facility] ENDOSCOPY;  Service: Endoscopy;  Laterality: N/A;   PROSTATE SURGERY  2002   PROSTATE SURGERY  2002   TOTAL HIP ARTHROPLASTY Right 05/27/2017   Procedure: TOTAL HIP ARTHROPLASTY ANTERIOR APPROACH;  Surgeon: Hessie Knows, MD;  Location: [facility] ORS;  Service: Orthopedics;  Laterality: Right;   Family History  Problem Relation Age of Onset   Cancer Father    Leukemia Father    Hypertension Sister    Hypertension Brother    Hypertension Sister    Hypertension Sister    Social History   Socioeconomic History   Marital status: Married    Spouse name: Carlos Conley   Number of children: 3   Years of education: Not on file   Highest education level: Some college, no degree  Occupational History   Occupation: Retired    Comment: Cytogeneticist at a plant  Tobacco Use   Smoking status: Never   Smokeless tobacco: Never  Vaping Use   Vaping Use: Never used  Substance and Sexual Activity   Alcohol use: Yes    Alcohol/week: 2.0 - 3.0 standard drinks    Types: 2 - 3 Cans of beer per week    Comment: daily   Drug use: No   Sexual activity: Yes    Partners: Female  Other Topics  Concern   Not on file  Social History Narrative   Retired April of 2016      Does not eat meat except for occasional fish   Social Determinants of Radio broadcast assistant Strain: Low Risk    Difficulty of Paying Living Expenses: Not hard at all  Food Insecurity: No Food Insecurity   Worried About Charity fundraiser in the Last Year: Never true   Arboriculturist in the Last Year: Never true  Transportation Needs: No Transportation Needs   Lack of Transportation (Medical): No   Lack of Transportation (Non-Medical): No  Physical Activity: Insufficiently Active   Days of Exercise per Week: 5 days   Minutes of Exercise per Session: 20 min  Stress: No Stress Concern Present   Feeling of Stress : Not at all  Social Connections: Moderately Integrated   Frequency of Communication with Friends and Family: More than three times a week   Frequency of Social Gatherings with Friends and Family: More than three times a week   Attends Religious Services: More than 4 times per year   Active Member of [employer] or Organizations: No   Attends Music therapist: Never   Marital Status: Married    Tobacco Counseling Counseling given: Not Answered   Clinical Intake:  Pre-visit preparation completed: Yes  Pain : No/denies pain     Nutritional Risks: None Diabetes: No  How often do you need to have someone help you when you read instructions, pamphlets, or other written materials from your doctor or pharmacy?: 1 - Never    Interpreter Needed?: No  Information entered by :: [name] LPN   Activities of Daily Living In your present state of health, do you have any difficulty performing the following activities: 06/12/2021 03/06/2021  Hearing? N N  Vision? N N  Difficulty concentrating or making decisions? N N  Walking or climbing stairs? N N  Dressing or bathing? N N  Doing errands, shopping? N N  Preparing Food and eating ? N -  Using the Toilet? N -  In the  past six months, have you accidently leaked urine? Y -  Do you have problems with loss of bowel control? N -  Managing your Medications? N -  Managing your Finances? N -  Housekeeping or managing your Housekeeping? N -  Some recent data might be hidden    Patient Care Team: Steele Sizer, MD as PCP - General (Family Medicine) Noreene Filbert, MD as Referring Physician (Radiation Oncology)  Indicate any recent Medical Services you may have received from other than [facility] providers in the past year (date may be approximate).     Assessment:   This is a routine wellness examination for Carlos Conley.  Hearing/Vision screen Hearing Screening - Comments:: Pt denies hearing difficulty  Vision Screening - Comments:: Annual vision screenings done at [place] issues and exercise activities discussed: Current Exercise Habits: Home exercise routine, Type of exercise: walking, Time (Minutes): 20, Frequency (Times/Week): 5, Weekly Exercise (Minutes/Week): 100, Intensity:  Mild, Exercise limited by: None identified   Goals Addressed             This Visit's Progress    DIET - INCREASE WATER INTAKE   Not on track    Recommend drinking 6-8 glasses of water per day     Hypertension Management   On track    [place] (see longitudinal plan of care for additional care plan information)  Current Barriers:  Chronic Disease Management support and education needs related to HTN and HLD.  Case Manager Clinical Goal(s):  Over the next 120 days, patient will: Continue taking all medications as prescribed. Attend all medical appointments as scheduled. Monitor blood pressure and record reading. Adhere to recommended cardiac prudent/heart healthy diet. Follow recommended safety measures to prevent falls and injuries.  Interventions:  Inter-disciplinary care team collaboration (see longitudinal plan of care) Reviewed medications and indications for use. Encouraged to continue  taking medications as prescribed. Encouraged to contact care management team with concerns regarding prescription costs. Provided information regarding established blood pressure parameters and indications for notifying provider. Encouraged to monitor a few times a week if unable to monitor daily and record readings. Reports recent reading of 140/89. Discussed current nutritional intake and heart healthy options. Encouraged to continue diet modifications to increase adherence with a cardiac prudent diet. Reports making several diet modifications and attempting to adhere to recommended diet. Eats fish most days with very limited intake of pork and red meat. Also attempting to decrease alcohol consumption. Encouraged to read nutritional labels and monitor consumption of sodium, added sugar and saturated fats. Discussed current activity level. Reviewed safety and fall prevention measures. Encouraged to continue mild activity and exercises as tolerated to improve heart health. Encouraged to keep pathways clear and well lit to prevent falls and injuries. Encouraged to stay hydrated and avoid strenuous activity during hot weather to prevent heat exhaustion. Reviewed scheduled/upcoming provider appointments. Encouraged to attend appointments as scheduled to prevent delays in care. Encouraged to contact care management team with concerns regarding transportation. Discussed plans for ongoing care management and follow up. Provided direct contact information.     Patient Self Care Activities:  Self administers medications  Calls pharmacy for medication refills Attends church or other social activities Performs ADL's independently Performs IADL's independently   Initial goal documentation        Depression Screen PHQ 2/9 Scores 06/12/2021 03/06/2021 12/20/2020 10/17/2020 06/11/2020 04/16/2020 03/08/2020  PHQ - 2 Score 0 0 0 0 0 0 0  PHQ- 9 Score - - - - - 0 -    Fall Risk Fall Risk  06/12/2021 03/06/2021  12/20/2020 10/17/2020 06/11/2020  Falls in the past year? 0 0 0 1 0  Number falls in past yr: 0 0 0 0 0  Injury with Fall? 0 0 0 1 0  Risk for fall due to : No Fall Risks - - - No Fall Risks  Follow up Falls prevention discussed - - - Falls prevention discussed    FALL RISK PREVENTION PERTAINING TO THE HOME:  Any stairs in or around the home? Yes  If so, are there any without handrails? No  Home free of loose throw rugs in walkways, pet beds, electrical cords, etc? Yes  Adequate lighting in your home to reduce risk of falls? Yes   ASSISTIVE DEVICES UTILIZED TO PREVENT FALLS:  Life alert? No  Use of a cane, walker or w/c? No  Grab bars in the bathroom? Yes  Shower chair  or bench in shower? No  Elevated toilet seat or a handicapped toilet? Yes   TIMED UP AND GO:  Was the test performed? No .  Telephonic visit.   Cognitive Function: Normal cognitive status assessed by direct observation by this Nurse Health Advisor. No abnormalities found.       6CIT Screen 06/11/2020 06/06/2019  What Year? 0 points 0 points  What month? 0 points 0 points  What time? 0 points 0 points  Count back from 20 0 points 0 points  Months in reverse 0 points 0 points  Repeat phrase 4 points 4 points  Total Score 4 4    Immunizations Immunization History  Administered Date(s) Administered   Fluad Quad(high Dose 65+) 05/15/2019, 06/04/2020   Influenza, High Dose Seasonal PF 05/24/2018   Influenza,inj,Quad PF,6+ Mos 06/19/2015, 06/02/2016, 07/09/2017   Influenza-Unspecified 06/04/2013   Moderna Sars-Covid-2 Vaccination 10/18/2019, 11/15/2019, 07/16/2020, 01/14/2021   Pneumococcal Conjugate-13 05/24/2018   Pneumococcal Polysaccharide-23 06/06/2019   Tdap 03/26/2016   Zoster, Live 03/26/2016    TDAP status: Up to date  Flu Vaccine status: Due, Education has been provided regarding the importance of this vaccine. Advised may receive this vaccine at local pharmacy or Health Dept. Aware to provide  a copy of the vaccination record if obtained from local pharmacy or Health Dept. Verbalized acceptance and understanding.  Pneumococcal vaccine status: Up to date  Covid-19 vaccine status: Completed vaccines  Qualifies for Shingles Vaccine? Yes   Zostavax completed Yes   Shingrix Completed?: No.    Education has been provided regarding the importance of this vaccine. Patient has been advised to call insurance company to determine out of pocket expense if they have not yet received this vaccine. Advised may also receive vaccine at local pharmacy or Health Dept. Verbalized acceptance and understanding.  Screening Tests Health Maintenance  Topic Date Due   Zoster Vaccines- Shingrix (1 of 2) Never done   COVID-19 Vaccine (5 - Booster for Moderna series) 03/11/2021   INFLUENZA VACCINE  03/24/2021   TETANUS/TDAP  03/26/2026   COLONOSCOPY (Pts 45-54yrs Insurance coverage will need to be confirmed)  07/28/2026   Pneumonia Vaccine 11+ Years old  Completed   Hepatitis C Screening  Completed   HPV VACCINES  Aged Out    Health Maintenance  Health Maintenance Due  Topic Date Due   Zoster Vaccines- Shingrix (1 of 2) Never done   COVID-19 Vaccine (5 - Booster for Moderna series) 03/11/2021   INFLUENZA VACCINE  03/24/2021    Colorectal cancer screening: Type of screening: Colonoscopy. Completed 07/28/16. Repeat every 10 years  Lung Cancer Screening: (Low Dose CT Chest recommended if Age 58-80 years, 30 pack-year currently smoking OR have quit w/in 15years.) does not qualify.   Additional Screening:  Hepatitis C Screening: does qualify; Completed 06/02/18  Vision Screening: Recommended annual ophthalmology exams for early detection of glaucoma and other disorders of the eye. Is the patient up to date with their annual eye exam?  No  Who is the provider or what is the name of the office in which the patient attends annual eye exams? [facility].   Dental Screening: Recommended annual dental  exams for proper oral hygiene  Community Resource Referral / Chronic Care Management: CRR required this visit?  No   CCM required this visit?  No      Plan:     I have personally reviewed and noted the following in the patient's chart:   Medical and social history Use of alcohol,  tobacco or illicit drugs  Current medications and supplements including opioid prescriptions. Patient is not currently taking opioid prescriptions. Functional ability and status Nutritional status Physical activity Advanced directives List of other physicians Hospitalizations, surgeries, and ER visits in previous 12 months Vitals Screenings to include cognitive, depression, and falls Referrals and appointments  In addition, I have reviewed and discussed with patient certain preventive protocols, quality metrics, and best practice recommendations. A written personalized care plan for preventive services as well as general preventive health recommendations were provided to patient.     [name], LPN   03/88/[id number]   Nurse Notes: none

## 2021-07-11 ENCOUNTER — Other Ambulatory Visit: Payer: Self-pay | Admitting: Family Medicine

## 2021-07-11 DIAGNOSIS — I10 Essential (primary) hypertension: Secondary | ICD-10-CM

## 2021-07-11 DIAGNOSIS — E7800 Pure hypercholesterolemia, unspecified: Secondary | ICD-10-CM

## 2021-07-15 NOTE — Telephone Encounter (Signed)
Lm with wife [name] to schedule appt. She will deliver the message.

## 2021-07-23 NOTE — Progress Notes (Signed)
Name: Carlos Conley   MRN: [id number]    DOB: 1951-08-06   Date:07/24/2021       Progress Note  Subjective  Chief Complaint  Follow Up  HPI  Hyperlipidemia: last time ASCVD was very high we started him on statin but he had hives with multiple statins in the past,  he is now on zetia and denies side effects, last LDL still high at 157 He has atherosclerosis of aorta seen on CT abdomen and pelvis. Discussed dietary modification and take aspirin 81 mg   Right hip pain; he has a hip replacement done by Dr. Rudene Christians in 05/2017, this past Summer he noticed  throbbing pain on right outer hip when he is active, stops within 5-10 minutes of rest. The pain was  bad enough to make him limp. He went back to Dr. Rudene Christians and was given exercises to do at home and is doing better now    HTN: he is currently taking  only one medication not sure if   Norvasc 10  mg and Diovan 320/25 and bp at home has been in the 130's range. No chest pain, dizziness  or palpitation, no sob .   History of prostate cancer: doing well, seeing Dr. Donella Stade yearly now. Last visit was March 2022, he goes for yearly checks.    Seizures: he states childhood history of seizures, started before kindergarten,  one episode in middle school, he was on anti-seizure medication through elementary school. He states usually triggered by diet. He had some syncopal episodes in July 2020 , one of the episodes he bit  his tongue and wife took him to [facility], he followed up with a neurologist in [place] and anti-seizure medication was stopped.He had one episode of syncope during winter 2022  , he was going to bed and fell backwards. He did not bite his tongue. He states he was out for about one minute, he recalled falling, he felt sluggished afterwards, but went to bed , he woke up dizzy the following morning but resolved within one day. When he saw me Feb 22 we discussed that he may have had another seizure, to monitor and may need to resume anti-seizure  medication, he has been symptom free since.    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  No side effects of medications. Explained importance of telling wife about his syphilis diagnosis, he states not sexually active with her for over one year.     Hyperglycemia: last A1C was at goal,  he has fatty liver, discussed healthier diet. He denies polyphagia, polydipsia or polyuria. Unchanged   Syphilis: he states he went to the health department and was treated with oral medications. Explained usually injectable therapy. We will obtain records. He said he was tested again and cleared   Patient Active Problem List   Diagnosis Date Noted   Hematuria 08/10/2019   Atherosclerosis of aorta ([place]) 08/10/2019   Fatty liver 08/10/2019   History of total right hip replacement 08/11/2017   Reflux esophagitis    Benign neoplasm of transverse colon    Hyperlipidemia 01/02/2016   Essential hypertension 01/02/2016   Hyperglycemia 01/02/2016   ED (erectile dysfunction) of organic origin 02/16/2013    Past Surgical History:  Procedure Laterality Date   COLONOSCOPY WITH PROPOFOL N/A 07/28/2016   Procedure: COLONOSCOPY WITH PROPOFOL;  Surgeon: Lucilla Lame, MD;  Location: [facility] ENDOSCOPY;  Service: Endoscopy;  Laterality: N/A;   PROSTATE SURGERY  2002  PROSTATE SURGERY  2002   TOTAL HIP ARTHROPLASTY Right 05/27/2017   Procedure: TOTAL HIP ARTHROPLASTY ANTERIOR APPROACH;  Surgeon: Hessie Knows, MD;  Location: [facility] ORS;  Service: Orthopedics;  Laterality: Right;    Family History  Problem Relation Age of Onset   Cancer Father    Leukemia Father    Hypertension Sister    Hypertension Brother    Hypertension Sister    Hypertension Sister     Social History   Tobacco Use   Smoking status: Never   Smokeless tobacco: Never  Substance Use Topics   Alcohol use: Yes    Alcohol/week: 2.0 - 3.0 standard drinks    Types: 2 - 3 Cans of beer per week    Comment:  daily     Current Outpatient Medications:    amLODipine (NORVASC) 10 MG tablet, TAKE 1 TABLET BY MOUTH EVERY DAY, Disp: 30 tablet, Rfl: 0   ASPIRIN LOW DOSE 81 MG EC tablet, TAKE 1 TABLET BY MOUTH EVERY DAY, Disp: 90 tablet, Rfl: 3   ezetimibe (ZETIA) 10 MG tablet, TAKE 1 TABLET BY MOUTH EVERY DAY, Disp: 30 tablet, Rfl: 0   sildenafil (VIAGRA) 100 MG tablet, Take 1 tablet (100 mg total) by mouth daily as needed for erectile dysfunction., Disp: 30 tablet, Rfl: 0   valsartan-hydrochlorothiazide (DIOVAN-HCT) 320-25 MG tablet, TAKE 1 TABLET BY MOUTH EVERY DAY (Patient not taking: Reported on 07/24/2021), Disp: 30 tablet, Rfl: 0  Allergies  Allergen Reactions   Lipitor [Atorvastatin] Rash   Pravastatin Itching, Rash and Other (See Comments)    Redness from feet up to knees.   Simvastatin Itching and Rash    I personally reviewed active problem list, medication list, allergies, family history, social history, health maintenance with the patient/caregiver today.   ROS  Constitutional: Negative for fever or weight change.  Respiratory: Negative for cough and shortness of breath.   Cardiovascular: Negative for chest pain or palpitations.  Gastrointestinal: Negative for abdominal pain, no bowel changes.  Musculoskeletal: Negative for gait problem or joint swelling.  Skin: Negative for rash.  Neurological: Negative for dizziness or headache.  No other specific complaints in a complete review of systems (except as listed in HPI above).   Objective  Vitals:   07/24/21 0751  BP: 136/80  Pulse: 83  Resp: 16  Temp: 97.7 F (36.5 C)  SpO2: 98%  Weight: 214 lb (97.1 kg)  Height: 5\' 7"  (1.702 m)    Body mass index is 33.52 kg/m.  Physical Exam  Constitutional: Patient appears well-developed and well-nourished. Obese  No distress.  HEENT: head atraumatic, normocephalic, pupils equal and reactive to light, neck supple Cardiovascular: Normal rate, regular rhythm and normal heart  sounds.  No murmur heard. No BLE edema. Pulmonary/Chest: Effort normal and breath sounds normal. No respiratory distress. Abdominal: Soft.  There is no tenderness. Psychiatric: Patient has a normal mood and affect. behavior is normal. Judgment and thought content normal.    PHQ2/9: Depression screen [facility] 2/9 07/24/2021 06/12/2021 03/06/2021 12/20/2020 10/17/2020  Decreased Interest 0 0 0 0 0  Down, Depressed, Hopeless 0 0 0 0 0  PHQ - 2 Score 0 0 0 0 0  Altered sleeping 0 - - - -  Tired, decreased energy 0 - - - -  Change in appetite 0 - - - -  Feeling bad or failure about yourself  0 - - - -  Trouble concentrating 0 - - - -  Moving slowly or fidgety/restless 0 - - - -  Suicidal thoughts 0 - - - -  PHQ-9 Score 0 - - - -  Difficult doing work/chores - - - - -  Some recent data might be hidden    phq 9 is negative   Fall Risk: Fall Risk  07/24/2021 06/12/2021 03/06/2021 12/20/2020 10/17/2020  Falls in the past year? 0 0 0 0 1  Number falls in past yr: 0 0 0 0 0  Injury with Fall? 0 0 0 0 1  Risk for fall due to : No Fall Risks No Fall Risks - - -  Follow up Falls prevention discussed Falls prevention discussed - - -     Functional Status Survey: Is the patient deaf or have difficulty hearing?: No Does the patient have difficulty seeing, even when wearing glasses/contacts?: No Does the patient have difficulty concentrating, remembering, or making decisions?: No Does the patient have difficulty walking or climbing stairs?: No Does the patient have difficulty dressing or bathing?: No Does the patient have difficulty doing errands alone such as visiting a doctor's office or shopping?: No    Assessment & Plan  1. History of syphilis  We will obtain records from health department, discussed rechecking for STI but he refused. Asked if he notified his wife but he said no, not sexually active with her for about one year, explained the importance of getting her tested  2. Essential  hypertension  He told me just taking diovan hctz, but told CMA he was taking norvasc, called pharmacy and he picked up rx 08 and has not been back. Called patient after visit and he states he has been taking diovan hctz at night and voids all day, we will switch to exforge and recheck bp is 3 months   3. Statin intolerance   4. Hyperglycemia  Last A1C at goal  5. Seizures ([place])  Not recently, needs to stop drinking alcohol   6. Fatty liver   7. History of prostate cancer  Keep follow up with Dr. Donella Stade   8. Atherosclerosis of aorta ([facility])  Refused injectables, only on zetia   9. Pure hypercholesterolemia  - ezetimibe (ZETIA) 10 MG tablet; Take 1 tablet (10 mg total) by mouth daily.  Dispense: 90 tablet; Refill: 1  10. Alcoholism ([place])  He used to drink hard liquor but now only beer but still most  days a week and about 4 per serving  , also had syncope after drinking

## 2021-07-24 ENCOUNTER — Ambulatory Visit (INDEPENDENT_AMBULATORY_CARE_PROVIDER_SITE_OTHER): Payer: Medicare Other | Admitting: Family Medicine

## 2021-07-24 ENCOUNTER — Encounter: Payer: Self-pay | Admitting: Family Medicine

## 2021-07-24 VITALS — BP 136/80 | HR 83 | Temp 97.7°F | Resp 16 | Ht 67.0 in | Wt 214.0 lb

## 2021-07-24 DIAGNOSIS — Z789 Other specified health status: Secondary | ICD-10-CM

## 2021-07-24 DIAGNOSIS — Z8619 Personal history of other infectious and parasitic diseases: Secondary | ICD-10-CM | POA: Diagnosis not present

## 2021-07-24 DIAGNOSIS — F1020 Alcohol dependence, uncomplicated: Secondary | ICD-10-CM

## 2021-07-24 DIAGNOSIS — I10 Essential (primary) hypertension: Secondary | ICD-10-CM | POA: Diagnosis not present

## 2021-07-24 DIAGNOSIS — E7800 Pure hypercholesterolemia, unspecified: Secondary | ICD-10-CM

## 2021-07-24 DIAGNOSIS — I700 Atherosclerosis of aorta: Secondary | ICD-10-CM

## 2021-07-24 DIAGNOSIS — R569 Unspecified convulsions: Secondary | ICD-10-CM

## 2021-07-24 DIAGNOSIS — R739 Hyperglycemia, unspecified: Secondary | ICD-10-CM | POA: Diagnosis not present

## 2021-07-24 DIAGNOSIS — Z8546 Personal history of malignant neoplasm of prostate: Secondary | ICD-10-CM

## 2021-07-24 DIAGNOSIS — K760 Fatty (change of) liver, not elsewhere classified: Secondary | ICD-10-CM

## 2021-07-24 MED ORDER — AMLODIPINE BESYLATE-VALSARTAN 5-160 MG PO TABS
1.0000 | ORAL_TABLET | Freq: Every day | ORAL | 0 refills | Status: DC
Start: 2021-07-24 — End: 2021-10-20

## 2021-07-24 MED ORDER — EZETIMIBE 10 MG PO TABS: 10.0000 mg | ORAL_TABLET | Freq: Every day | ORAL | 1 refills | Status: DC

## 2021-07-28 ENCOUNTER — Other Ambulatory Visit: Payer: Self-pay

## 2021-08-19 ENCOUNTER — Ambulatory Visit: Payer: Self-pay

## 2021-08-19 ENCOUNTER — Other Ambulatory Visit: Payer: Self-pay

## 2021-08-19 ENCOUNTER — Ambulatory Visit
Admission: EM | Admit: 2021-08-19 | Discharge: 2021-08-19 | Disposition: A | Discharge status: Home / Self Care | Payer: Medicare Other | Attending: Family Medicine | Admitting: Family Medicine

## 2021-08-19 DIAGNOSIS — M79622 Pain in left upper arm: Secondary | ICD-10-CM

## 2021-08-19 DIAGNOSIS — R82998 Other abnormal findings in urine: Secondary | ICD-10-CM

## 2021-08-19 DIAGNOSIS — R030 Elevated blood-pressure reading, without diagnosis of hypertension: Secondary | ICD-10-CM

## 2021-08-19 LAB — POCT URINALYSIS DIP (MANUAL ENTRY)
Blood, UA: NEGATIVE
Glucose, UA: NEGATIVE mg/dL
Leukocytes, UA: NEGATIVE
Nitrite, UA: NEGATIVE
Spec Grav, UA: >=1.03 — AB (ref 1.010–1.025)
Urobilinogen, UA: 0.2 E.U./dL
pH, UA: 5.5 (ref 5.0–8.0)

## 2021-08-19 MED ORDER — PREDNISONE 20 MG PO TABS: 40.0000 mg | ORAL_TABLET | Freq: Every day | ORAL | 0 refills | Status: DC

## 2021-08-19 MED ORDER — CYCLOBENZAPRINE HCL 5 MG PO TABS: 5.0000 mg | ORAL_TABLET | Freq: Three times a day (TID) | ORAL | 0 refills | Status: DC | PRN

## 2021-08-19 MED ORDER — KETOROLAC TROMETHAMINE 30 MG/ML IJ SOLN
30.0000 mg | Freq: Once | INTRAMUSCULAR | Status: AC
  Administered 2021-08-19: 16:00:00 30 mg via INTRAMUSCULAR

## 2021-08-19 NOTE — ED Triage Notes (Signed)
Patient states he was working in the yard today he felt a sharp pain in his left arm after picking up small limbs in the yard.  Patient states that he is unable to raise his arm.  Patient state that he had blood in his urine and his left side was hurting. He was thinking he drank a lot of water because he thought he was dehydrated but his urine is still very dark colored without an odor.

## 2021-08-19 NOTE — Progress Notes (Signed)
Name: Carlos Conley   MRN: [id number]    DOB: Jan 16, 1951   Date:08/20/2021       Progress Note  Subjective  Chief Complaint  Arm Pain  HPI  Acute left shoulder pain: he worked in his yard yesterday for over one hour, once he stopped he developed acute pain on lateral upper arm. He went to [facility] and was given a Toradol injection and prednisone tablets ( not filled yet - pharmacy didn't have it in stock) he states today the pain is significantly better, taking cyclobenzaprine before bed, pain today is  down to 1/10 , aggravated but extending arm forward - it goes up to 2-3/10   Hematuria: he states had episodes in the past, most recently this past weekend, when he went to UC yesterday it was orange and not blood. Advised to bring me a specimen when he is passing blood and if positive we will refer him to Urologist He said before he had to void he had a sharp pain on left flank but is better now.   HTN: last visit bp was at goal, he is in pain, we will monitor for now.   Patient Active Problem List   Diagnosis Date Noted   Hematuria 08/10/2019   Atherosclerosis of aorta ([place]) 08/10/2019   Fatty liver 08/10/2019   History of total right hip replacement 08/11/2017   Reflux esophagitis    Benign neoplasm of transverse colon    Hyperlipidemia 01/02/2016   Essential hypertension 01/02/2016   Hyperglycemia 01/02/2016   ED (erectile dysfunction) of organic origin 02/16/2013    Past Surgical History:  Procedure Laterality Date   COLONOSCOPY WITH PROPOFOL N/A 07/28/2016   Procedure: COLONOSCOPY WITH PROPOFOL;  Surgeon: Lucilla Lame, MD;  Location: [facility] ENDOSCOPY;  Service: Endoscopy;  Laterality: N/A;   PROSTATE SURGERY  2002   PROSTATE SURGERY  2002   TOTAL HIP ARTHROPLASTY Right 05/27/2017   Procedure: TOTAL HIP ARTHROPLASTY ANTERIOR APPROACH;  Surgeon: Hessie Knows, MD;  Location: [facility] ORS;  Service: Orthopedics;  Laterality: Right;    Family History  Problem Relation Age of  Onset   Cancer Father    Leukemia Father    Hypertension Sister    Hypertension Brother    Hypertension Sister    Hypertension Sister     Social History   Tobacco Use   Smoking status: Never   Smokeless tobacco: Never  Substance Use Topics   Alcohol use: Yes    Alcohol/week: 2.0 - 3.0 standard drinks    Types: 2 - 3 Cans of beer per week    Comment: daily     Current Outpatient Medications:    amLODipine-valsartan (EXFORGE) 5-160 MG tablet, Take 1 tablet by mouth daily., Disp: 90 tablet, Rfl: 0   ASPIRIN LOW DOSE 81 MG EC tablet, TAKE 1 TABLET BY MOUTH EVERY DAY, Disp: 90 tablet, Rfl: 3   cyclobenzaprine (FLEXERIL) 5 MG tablet, Take 1 tablet (5 mg total) by mouth 3 (three) times daily as needed for muscle spasms. Do not drink alcohol or drive while taking this medication. May cause drowsiness, Disp: 15 tablet, Rfl: 0   ezetimibe (ZETIA) 10 MG tablet, Take 1 tablet (10 mg total) by mouth daily., Disp: 90 tablet, Rfl: 1   sildenafil (VIAGRA) 100 MG tablet, Take 1 tablet (100 mg total) by mouth daily as needed for erectile dysfunction., Disp: 30 tablet, Rfl: 0   valsartan-hydrochlorothiazide (DIOVAN-HCT) 320-25 MG tablet, Take 1 tablet by mouth daily., Disp: , Rfl:  predniSONE (DELTASONE) 20 MG tablet, Take 2 tablets (40 mg total) by mouth daily with breakfast. (Patient not taking: Reported on 08/20/2021), Disp: 6 tablet, Rfl: 0  Allergies  Allergen Reactions   Lipitor [Atorvastatin] Rash   Pravastatin Itching, Rash and Other (See Comments)    Redness from feet up to knees.   Simvastatin Itching and Rash    I personally reviewed active problem list, medication list, allergies, family history, social history, health maintenance with the patient/caregiver today.   ROS  Ten systems reviewed and is negative except as mentioned in HPI   Objective  Vitals:   08/20/21 1410 08/20/21 1433  BP: (!) 172/88 (!) 152/84  Pulse: 95   Resp: 16   Temp: 98.1 F (36.7 C)   TempSrc:  Oral   SpO2: 96%   Weight: 212 lb 1.6 oz (96.2 kg)   Height: 5\' 7"  (1.702 m)     Body mass index is 33.22 kg/m.  Physical Exam  Constitutional: Patient appears well-developed and well-nourished. Obese No distress.  HEENT: head atraumatic, normocephalic, pupils equal and reactive to light, neck supple Cardiovascular: Normal rate, regular rhythm and normal heart sounds.  No murmur heard. No BLE edema. Pulmonary/Chest: Effort normal and breath sounds normal. No respiratory distress. Abdominal: Soft.  There is no tenderness.Negative CVA tenderness Muscular Skeletal: pain during palpation of deltoid bursa ( distal aspect) it may be a tendon, very specific point tenderness.  Psychiatric: Patient has a normal mood and affect. behavior is normal. Judgment and thought content normal.   Recent Results (from the past 2160 hour(s))  POCT urinalysis dipstick     Status: Abnormal   Collection Time: 08/19/21  3:37 PM  Result Value Ref Range   Color, UA orange (A) yellow   Clarity, UA clear clear   Glucose, UA negative negative mg/dL   Bilirubin, UA small (A) negative   Ketones, POC UA trace (5) (A) negative mg/dL   Spec Grav, UA >=1.030 (A) 1.010 - 1.025   Blood, UA negative negative   pH, UA 5.5 5.0 - 8.0   Protein Ur, POC trace (A) negative mg/dL   Urobilinogen, UA 0.2 0.2 or 1.0 E.U./dL   Nitrite, UA Negative Negative   Leukocytes, UA Negative Negative     PHQ2/9: Depression screen [facility] 2/9 08/20/2021 07/24/2021 06/12/2021 03/06/2021 12/20/2020  Decreased Interest 0 0 0 0 0  Down, Depressed, Hopeless 0 0 0 0 0  PHQ - 2 Score 0 0 0 0 0  Altered sleeping 0 0 - - -  Tired, decreased energy 0 0 - - -  Change in appetite 0 0 - - -  Feeling bad or failure about yourself  0 0 - - -  Trouble concentrating 0 0 - - -  Moving slowly or fidgety/restless 0 0 - - -  Suicidal thoughts 0 0 - - -  PHQ-9 Score 0 0 - - -  Difficult doing work/chores Not difficult at all - - - -  Some recent data  might be hidden    phq 9 is negative   Fall Risk: Fall Risk  08/20/2021 07/24/2021 06/12/2021 03/06/2021 12/20/2020  Falls in the past year? 0 0 0 0 0  Number falls in past yr: 0 0 0 0 0  Injury with Fall? 0 0 0 0 0  Risk for fall due to : No Fall Risks No Fall Risks No Fall Risks - -  Follow up Falls prevention discussed Falls prevention discussed Falls prevention discussed - -  Functional Status Survey: Is the patient deaf or have difficulty hearing?: No Does the patient have difficulty seeing, even when wearing glasses/contacts?: No Does the patient have difficulty concentrating, remembering, or making decisions?: No Does the patient have difficulty walking or climbing stairs?: No Does the patient have difficulty dressing or bathing?: No Does the patient have difficulty doing errands alone such as visiting a doctor's office or shopping?: No    Assessment & Plan  1. Bursitis of left deltoid  Advised to take prednisone, with food, continue muscle relaxer and apply ice   2. Acute left flank pain  Urine was dark but negative for blood at UC, advised to bring me a cup with urine when he sees blood. Also explained likely muscular pain , negative CVA tenderness, more oblique muscle pain   3. Essential hypertension  Monitor for now, continue bp medications daily and avoid alcohol

## 2021-08-19 NOTE — Telephone Encounter (Signed)
°  Chief Complaint: severe arm pain Symptoms: arm pain 10/10, unable to raise up, knot on L shoulder Frequency: today Pertinent Negatives: NA Disposition: [] ED /[x] Urgent Care (no appt availability in office) / [] Appointment(In office/virtual)/ []  [facility]/ [] Home Care/ [] Refused Recommended Disposition  Additional Notes: Pt was advised no appt for today available and would need to go to UC to be evaluated and possible imaging done. Pt requested to keep appt for tomorrow that was scheduled by Agent and to call back if need to cancel. Pt was going to UC today in [place].    Reason for Disposition  [1] SEVERE pain AND [2] not improved 2 hours after pain medicine  Answer Assessment - Initial Assessment Questions 1. ONSET: "When did the pain start?"     Last week but today is worse 2. LOCATION: "Where is the pain located?"     L arm 3. PAIN: "How bad is the pain?" (Scale 1-10; or mild, moderate, severe)   - MILD (1-3): doesn't interfere with normal activities   - MODERATE (4-7): interferes with normal activities (e.g., work or school) or awakens from sleep   - SEVERE (8-10): excruciating pain, unable to do any normal activities, unable to hold a cup of water     10 4. WORK OR EXERCISE: "Has there been any recent work or exercise that involved this part of the body?"     No 6. OTHER SYMPTOMS: "Do you have any other symptoms?" (e.g., neck pain, swelling, rash, fever, numbness, weakness)     Knot below shoulder  Protocols used: Arm Pain-A-AH

## 2021-08-20 ENCOUNTER — Encounter: Payer: Self-pay | Admitting: Family Medicine

## 2021-08-20 ENCOUNTER — Telehealth: Payer: Self-pay

## 2021-08-20 ENCOUNTER — Ambulatory Visit (INDEPENDENT_AMBULATORY_CARE_PROVIDER_SITE_OTHER): Payer: Medicare Other | Admitting: Family Medicine

## 2021-08-20 VITALS — BP 160/88 | HR 95 | Temp 98.1°F | Resp 16 | Ht 67.0 in | Wt 212.1 lb

## 2021-08-20 DIAGNOSIS — I10 Essential (primary) hypertension: Secondary | ICD-10-CM

## 2021-08-20 DIAGNOSIS — R109 Unspecified abdominal pain: Secondary | ICD-10-CM | POA: Diagnosis not present

## 2021-08-20 DIAGNOSIS — M7552 Bursitis of left shoulder: Secondary | ICD-10-CM | POA: Diagnosis not present

## 2021-08-20 NOTE — Telephone Encounter (Signed)
Pt already have appt scheduled with Dr Ancil Boozer for today

## 2021-08-23 NOTE — ED Provider Notes (Signed)
[facility]    CSN: [id number] Arrival date & time: 08/19/21  1248      History   Chief Complaint Chief Complaint  Patient presents with   Shoulder Pain    Left shoulder pain    HPI Carlos Conley is a 70 y.o. male.   Presenting today with sharp left upper arm pain, swollen knot after picking up some limbs in his yard earlier today. States he felt a pop when reaching and has had worsening pain with movement or pressure applied. So far not trying anything OTC for sxs. Also states he's having blood in his urine and left sided pain. Denies dysuria, urinary frequency, N/V, fever.    Past Medical History:  Diagnosis Date   Arthritis    Cancer [facility])    Prostate   Hyperlipidemia    Hypertension     Patient Active Problem List   Diagnosis Date Noted   Hematuria 08/10/2019   Atherosclerosis of aorta ([place]) 08/10/2019   Fatty liver 08/10/2019   History of total right hip replacement 08/11/2017   Reflux esophagitis    Benign neoplasm of transverse colon    Hyperlipidemia 01/02/2016   Essential hypertension 01/02/2016   Hyperglycemia 01/02/2016   ED (erectile dysfunction) of organic origin 02/16/2013    Past Surgical History:  Procedure Laterality Date   COLONOSCOPY WITH PROPOFOL N/A 07/28/2016   Procedure: COLONOSCOPY WITH PROPOFOL;  Surgeon: Lucilla Lame, MD;  Location: [facility];  Service: Endoscopy;  Laterality: N/A;   PROSTATE SURGERY  2002   PROSTATE SURGERY  2002   TOTAL HIP ARTHROPLASTY Right 05/27/2017   Procedure: TOTAL HIP ARTHROPLASTY ANTERIOR APPROACH;  Surgeon: Hessie Knows, MD;  Location: [facility];  Service: Orthopedics;  Laterality: Right;       Home Medications    Prior to Admission medications   Medication Sig Start Date End Date Taking? Authorizing Provider  cyclobenzaprine (FLEXERIL) 5 MG tablet Take 1 tablet (5 mg total) by mouth 3 (three) times daily as needed for muscle spasms. Do not drink alcohol or drive while taking this  medication. May cause drowsiness 08/19/21  Yes Volney American, PA-C  predniSONE (DELTASONE) 20 MG tablet Take 2 tablets (40 mg total) by mouth daily with breakfast. Patient not taking: Reported on 08/20/2021 08/19/21  Yes Volney American, PA-C  amLODipine-valsartan (EXFORGE) 5-160 MG tablet Take 1 tablet by mouth daily. 07/24/21   Steele Sizer, MD  ASPIRIN LOW DOSE 81 MG EC tablet TAKE 1 TABLET BY MOUTH EVERY DAY 04/11/21   Steele Sizer, MD  ezetimibe (ZETIA) 10 MG tablet Take 1 tablet (10 mg total) by mouth daily. 07/24/21   Steele Sizer, MD  sildenafil (VIAGRA) 100 MG tablet Take 1 tablet (100 mg total) by mouth daily as needed for erectile dysfunction. 03/06/21   Steele Sizer, MD  valsartan-hydrochlorothiazide (DIOVAN-HCT) 320-25 MG tablet Take 1 tablet by mouth daily. 08/10/21   [provider]    Family History Family History  Problem Relation Age of Onset   Cancer Father    Leukemia Father    Hypertension Sister    Hypertension Brother    Hypertension Sister    Hypertension Sister     Social History Social History   Tobacco Use   Smoking status: Never   Smokeless tobacco: Never  Vaping Use   Vaping Use: Never used  Substance Use Topics   Alcohol use: Yes    Alcohol/week: 2.0 - 3.0 standard drinks    Types: 2 -  3 Cans of beer per week    Comment: daily   Drug use: No     Allergies   Lipitor [atorvastatin], Pravastatin, and Simvastatin   Review of Systems Review of Systems PER HPI  Physical Exam Triage Vital Signs ED Triage Vitals  Enc Vitals Group     BP 08/19/21 1514 (!) 214/114     Pulse Rate 08/19/21 1514 84     Resp 08/19/21 1514 18     Temp 08/19/21 1514 (!) 97.4 F (36.3 C)     Temp Source 08/19/21 1514 Oral     SpO2 08/19/21 1514 98 %     Weight --      Height --      Head Circumference --      Peak Flow --      Pain Score 08/19/21 1511 10     Pain Loc --      Pain Edu? --      Excl. in [place]? --    No data  found.  Updated Vital Signs BP (!) 212/103 (BP Location: Right Arm)    Pulse 84    Temp (!) 97.4 F (36.3 C) (Oral)    Resp 18    SpO2 98%   Visual Acuity Right Eye Distance:   Left Eye Distance:   Bilateral Distance:    Right Eye Near:   Left Eye Near:    Bilateral Near:     Physical Exam Vitals and nursing note reviewed.  Constitutional:      Appearance: Normal appearance.  HENT:     Head: Atraumatic.  Eyes:     Extraocular Movements: Extraocular movements intact.     Conjunctiva/sclera: Conjunctivae normal.  Cardiovascular:     Rate and Rhythm: Normal rate and regular rhythm.  Pulmonary:     Effort: Pulmonary effort is normal.     Breath sounds: Normal breath sounds.  Abdominal:     General: Bowel sounds are normal. There is no distension.     Palpations: Abdomen is soft.     Tenderness: There is no abdominal tenderness. There is no right CVA tenderness, left CVA tenderness or guarding.  Musculoskeletal:        General: Swelling, tenderness and signs of injury present. Normal range of motion.     Cervical back: Normal range of motion and neck supple.     Comments: 2 cm semi-firm palpable knot lateral mid-upper left arm, severe ttp. ROM intact but painful per patient  Skin:    General: Skin is warm and dry.     Findings: No bruising or erythema.  Neurological:     General: No focal deficit present.     Mental Status: He is oriented to person, place, and time.     Comments: LUE neurovascularly intact  Psychiatric:        Mood and Affect: Mood normal.        Thought Content: Thought content normal.        Judgment: Judgment normal.     UC Treatments / Results  Labs (all labs ordered are listed, but only abnormal results are displayed) Labs Reviewed  POCT URINALYSIS DIP (MANUAL ENTRY) - Abnormal; Notable for the following components:      Result Value   Color, UA orange (*)    Bilirubin, UA small (*)    Ketones, POC UA trace (5) (*)    Spec Grav, UA >=1.030  (*)    Protein Ur, POC trace (*)  All other components within normal limits    EKG   Radiology No results found.  Procedures Procedures (including critical care time)  Medications Ordered in UC Medications  ketorolac (TORADOL) 30 MG/ML injection 30 mg (30 mg Intramuscular Given 08/19/21 1559)    Initial Impression / Assessment and Plan / UC Course  I have reviewed the triage vital signs and the nursing notes.  Pertinent labs & imaging results that were available during my care of the patient were reviewed by me and considered in my medical decision making (see chart for details).     Significant elevated BP in triage, likely secondary to pain and when asked states he hasn't taken his BP medication. Otherwise VS reassuring. Suspect pulled muscle in bicep causing sxs, IM toradol given in clinic and upon recheck about 30 min post dose states moderate symptomatic improvement. U/A without evidence of UTI, hgb. Treat with flexeril, prednisone, fluids. Discussed importance of taking BP medications immediately and close PCP f/u.   Final Clinical Impressions(s) / UC Diagnoses   Final diagnoses:  Left upper arm pain  Dark urine  Elevated blood pressure reading   Discharge Instructions   None    ED Prescriptions     Medication Sig Dispense Auth. Provider   predniSONE (DELTASONE) 20 MG tablet Take 2 tablets (40 mg total) by mouth daily with breakfast. Patient not taking:  Reported on 08/20/2021 6 tablet Volney American, PA-C   cyclobenzaprine (FLEXERIL) 5 MG tablet Take 1 tablet (5 mg total) by mouth 3 (three) times daily as needed for muscle spasms. Do not drink alcohol or drive while taking this medication. May cause drowsiness 15 tablet Volney American, [place]      PDMP not reviewed this encounter.   Volney American, [place] 08/23/21 2149

## 2021-10-20 ENCOUNTER — Other Ambulatory Visit: Payer: Self-pay | Admitting: Family Medicine

## 2021-10-31 ENCOUNTER — Inpatient Hospital Stay: Payer: Medicare Other | Attending: Radiation Oncology

## 2021-10-31 ENCOUNTER — Other Ambulatory Visit: Payer: Self-pay

## 2021-10-31 DIAGNOSIS — C61 Malignant neoplasm of prostate: Secondary | ICD-10-CM | POA: Diagnosis present

## 2021-10-31 LAB — PSA: Prostatic Specific Antigen: 0.02 ng/mL (ref 0.00–4.00)

## 2021-11-05 ENCOUNTER — Ambulatory Visit
Admission: RE | Admit: 2021-11-05 | Discharge: 2021-11-05 | Disposition: A | Discharge status: Home / Self Care | Payer: Medicare Other | Source: Ambulatory Visit | Attending: Radiation Oncology | Admitting: Radiation Oncology

## 2021-11-05 ENCOUNTER — Other Ambulatory Visit: Payer: Self-pay

## 2021-11-05 VITALS — BP 190/102 | HR 90 | Temp 98.6°F | Resp 17 | Ht 68.0 in | Wt 220.0 lb

## 2021-11-05 DIAGNOSIS — Z923 Personal history of irradiation: Secondary | ICD-10-CM | POA: Insufficient documentation

## 2021-11-05 DIAGNOSIS — C61 Malignant neoplasm of prostate: Secondary | ICD-10-CM | POA: Insufficient documentation

## 2021-11-05 NOTE — Progress Notes (Signed)
Radiation Oncology ?Follow up Note ? ?Name: Carlos Conley   ?Date:   11/05/2021 ?MRN:  [id number] ?DOB: Apr 14, 1951  ? ? ?This 71 y.o. male presents to the clinic today for 4-1/2-year follow-up status post salvage radiation therapy for adenocarcinoma the prostate status post prostatectomy 2003 with biochemical failure. ? ?REFERRING PROVIDER: Steele Sizer, MD ? ?HPI: Patient is a 71 year old male now out over 4-1/2 years having completed salvage radiation therapy status post prostatectomy 2003.  Seen today in routine follow-up he is doing well.  He specifically denies any increased lower urinary tract symptoms diarrhea or fatigue he has been having some slight urinary incontinence lately which is new.Marland Kitchen  His PSA is 0.0 to ? ?COMPLICATIONS OF TREATMENT: none ? ?FOLLOW UP COMPLIANCE: keeps appointments  ? ?PHYSICAL EXAM:  ?BP (!) 190/102   Pulse 90   Temp 98.6 ?F (37 ?C)   Resp 17   Ht '5\' 8"'$  (1.727 m)   Wt 220 lb (99.8 kg)   BMI 33.45 kg/m?  ?Well-developed well-nourished patient in NAD. HEENT reveals PERLA, EOMI, discs not visualized.  Oral cavity is clear. No oral mucosal lesions are identified. Neck is clear without evidence of cervical or supraclavicular adenopathy. Lungs are clear to A&P. Cardiac examination is essentially unremarkable with regular rate and rhythm without murmur rub or thrill. Abdomen is benign with no organomegaly or masses noted. Motor sensory and DTR levels are equal and symmetric in the upper and lower extremities. Cranial nerves II through XII are grossly intact. Proprioception is intact. No peripheral adenopathy or edema is identified. No motor or sensory levels are noted. Crude visual fields are within normal range. ? ?RADIOLOGY RESULTS: No current films for review ? ?PLAN: Present time patient is close to 5 years out from salvage radiation therapy to his prostatic fossa.  He has excellent biochemical control of his prostate cancer.  I have suggested should his incontinence worsen  over time he should see the urologist again.  At this time I am fine discontinuing follow-up care on the patient.  He is PMD will do yearly PSA checks.  I be happy to reevaluate the patient anytime should that be indicated. ? ?I would like to take this opportunity to thank you for allowing me to participate in the care of your patient.. ?  ? Noreene Filbert, MD ? ?

## 2021-11-24 IMAGING — CT CT ABD-PEL WO/W CM
2 of 9 series · 11 of 46 positions shown, 17 images · IV contrast (omnipaque)
Comparison: 07/13/2012 PET

CLINICAL DATA: Hematuria. GI bleeding. Postvoid bleeding and rectal
bleeding. Prostatectomy 18 years ago. Gross hematuria for 1 month.
One episode of blood in stool.

EXAM:
CT ABDOMEN AND PELVIS WITHOUT AND WITH CONTRAST
TECHNIQUE: Multidetector CT imaging of the abdomen and pelvis was performed
following the standard protocol before and following the bolus
administration of intravenous contrast.
CONTRAST:  125mL OMNIPAQUE IOHEXOL 300 MG/ML SOLN 125 cc of
Omnipaque 300

[Series 2: abd/pel pre · axial · non-contrast · 0.74mm/px · z∈[-883,-518]mm · 8 of 95 slices shown, 13 images]
[im 11/95  soft-tissue]
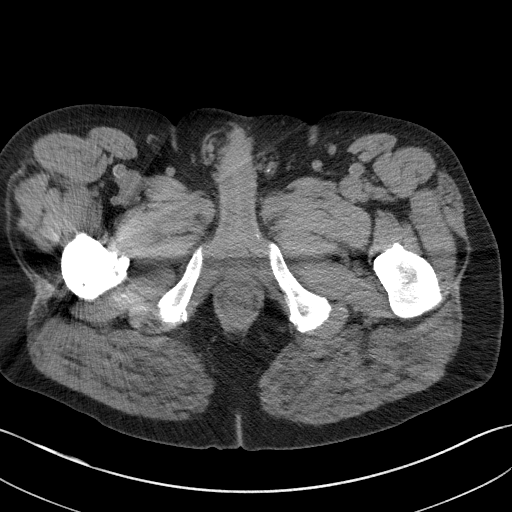
[im 11/95  bone]
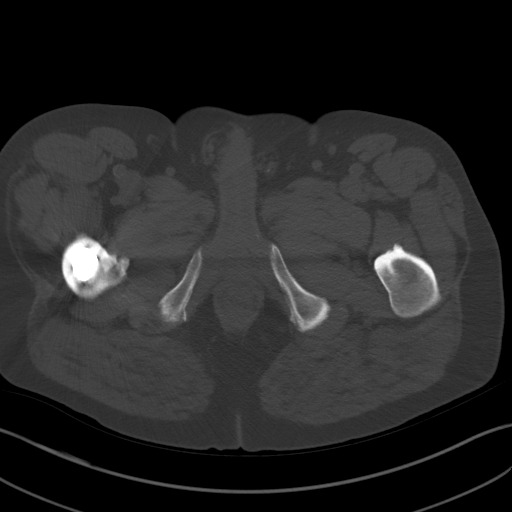
[im 21/95  soft-tissue]
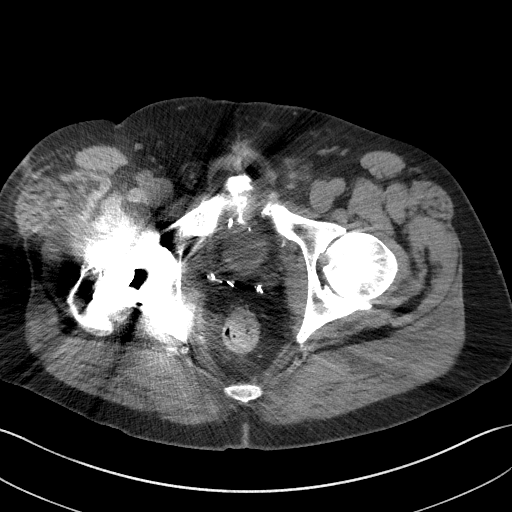
[im 32/95  soft-tissue]
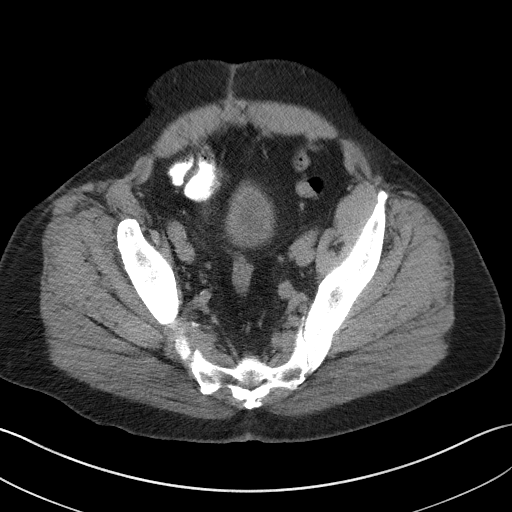
[im 42/95  soft-tissue]
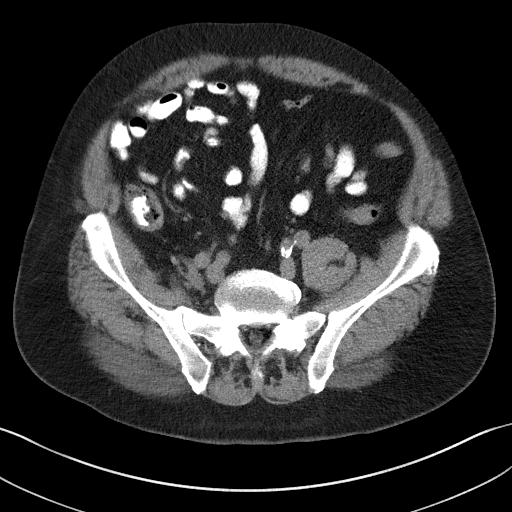
[im 53/95  soft-tissue]
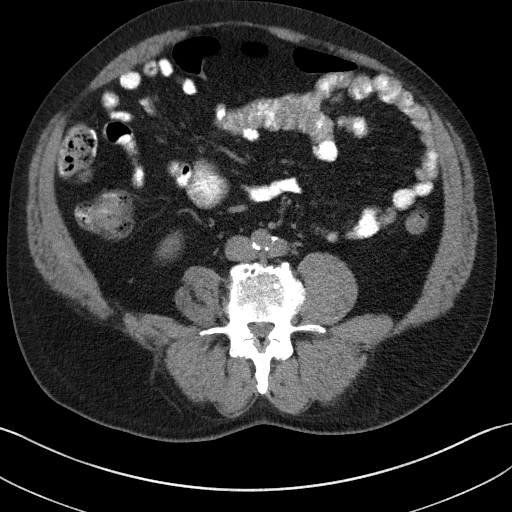
[im 53/95  lung]
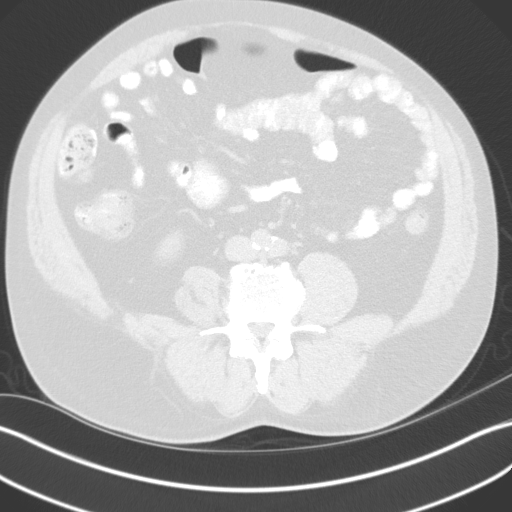
[im 63/95  soft-tissue]
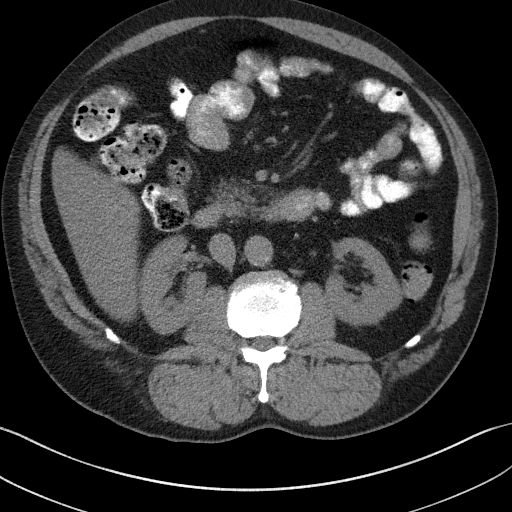
[im 63/95  lung]
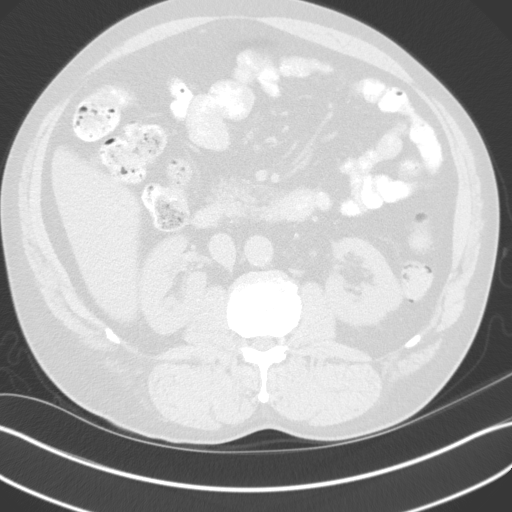
[im 74/95  soft-tissue]
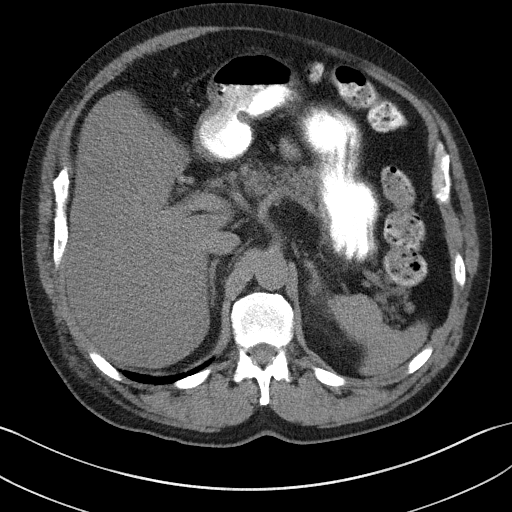
[im 74/95  lung]
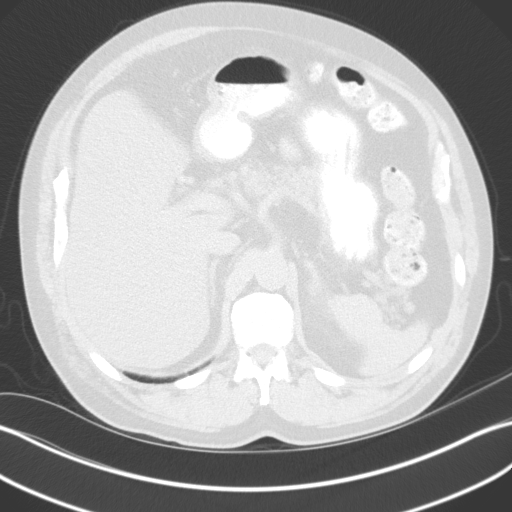
[im 84/95  soft-tissue]
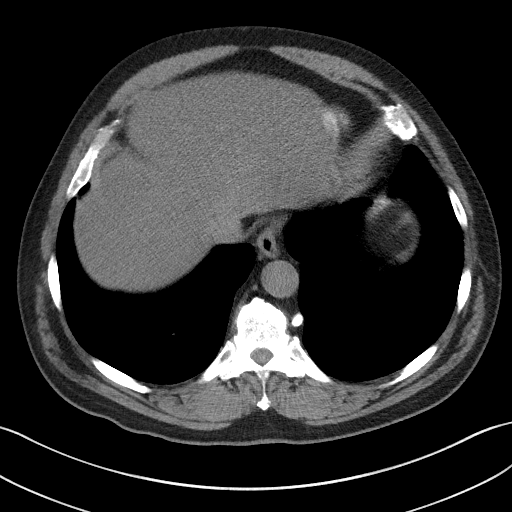
[im 84/95  lung]
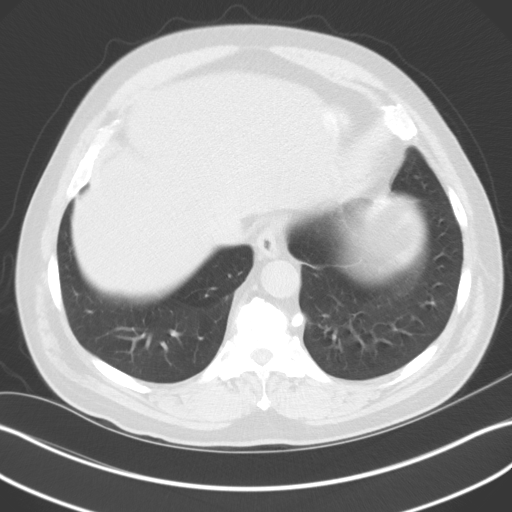

[Series 5: coronal st · coronal · 0.79mm/px · 3 of 114 slices shown, 4 images]
[im 29/114  soft-tissue]
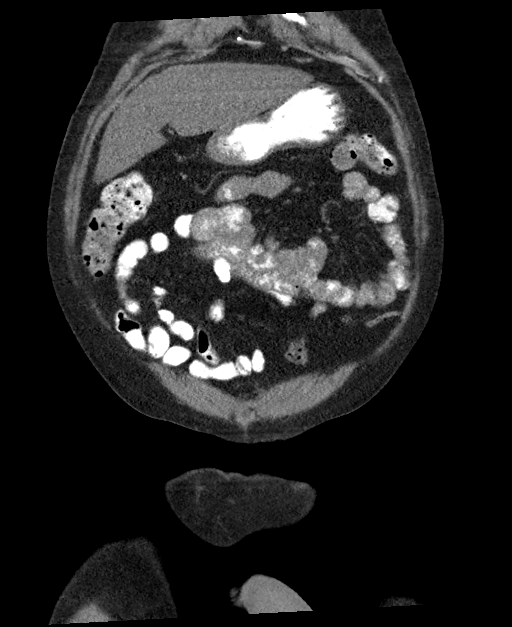
[im 57/114  soft-tissue]
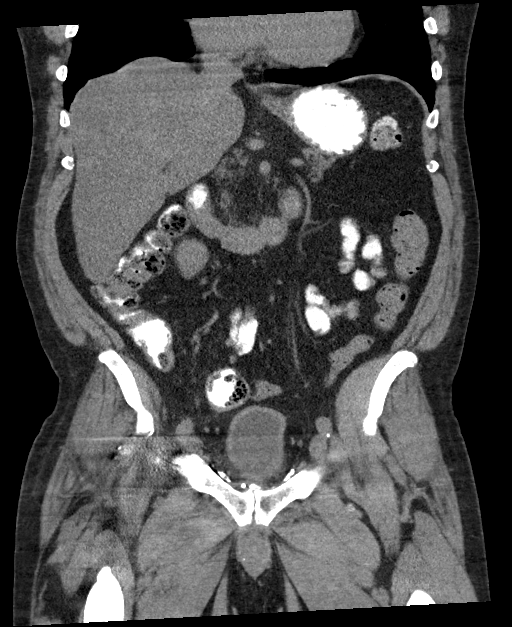
[im 57/114  bone]
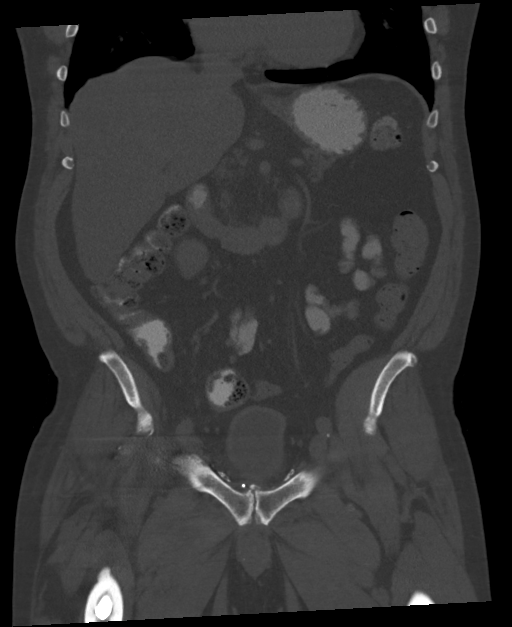
[im 85/114  soft-tissue]
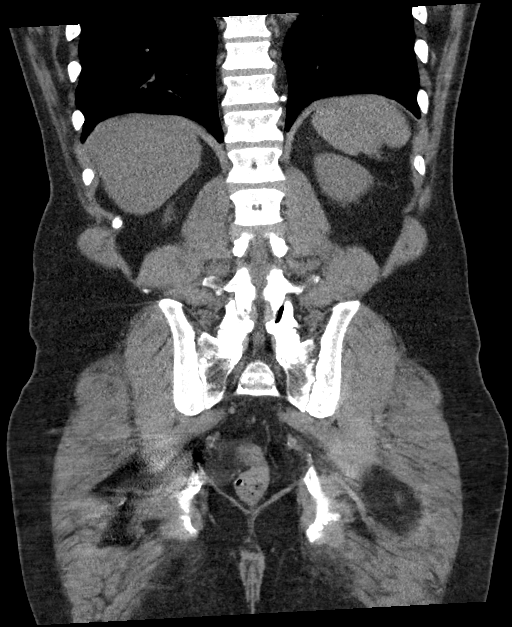

[11 of 46 positions shown; findings below may reference images not displayed]

FINDINGS: Lower chest: Bibasilar scarring Normal heart size without
pericardial or pleural effusion.

Hepatobiliary: Moderate hepatic steatosis. Normal gallbladder,
without biliary ductal dilatation.

Pancreas: Normal, without mass or ductal dilatation.

Spleen: Normal in size, without focal abnormality.

Adrenals/Urinary Tract: Normal adrenal glands. No renal calculi or
hydronephrosis. No hydroureter or ureteric calculi. Degraded
evaluation of the pelvis, secondary to beam hardening artifact from
right hip arthroplasty. No gross bladder calculi.

No renal mass on post-contrast images. Moderate renal collecting
system opacification on delayed images. No collecting system filling
defect. Good ureteric opacification on delays, without filling
defect. No gross enhancing bladder mass or posterior bladder filling
defect on delayed images.

Stomach/Bowel: Normal stomach, without wall thickening. Normal
colon, appendix, and terminal ileum. Normal small bowel.

Vascular/Lymphatic: Aortic atherosclerosis. No abdominal adenopathy.
Right-sided pelvic node dissection. No gross pelvic adenopathy.

Reproductive: Prostatectomy.

Other: No significant free fluid.

Musculoskeletal: Right hip arthroplasty. Mild left hip
osteoarthritis. Left greater than right degenerative arthropathy of
the sacroiliac joints. Thoracolumbar spondylosis
IMPRESSION: 1. No acute process in the abdomen or pelvis. No explanation for
hematuria or blood in stool. Degraded evaluation of the pelvis,
secondary to beam hardening artifact from right hip arthroplasty.
2. Hepatic steatosis.
3.  Aortic Atherosclerosis ([id number]).
4. Prostatectomy, without findings of metastatic disease.

## 2022-03-05 NOTE — Patient Instructions (Signed)
Preventive Care 65 Years and Older, Male Preventive care refers to lifestyle choices and visits with your health care provider that can promote health and wellness. Preventive care visits are also called wellness exams. What can I expect for my preventive care visit? Counseling During your preventive care visit, your health care provider may ask about your: Medical history, including: Past medical problems. Family medical history. History of falls. Current health, including: Emotional well-being. Home life and relationship well-being. Sexual activity. Memory and ability to understand (cognition). Lifestyle, including: Alcohol, nicotine or tobacco, and drug use. Access to firearms. Diet, exercise, and sleep habits. Work and work environment. Sunscreen use. Safety issues such as seatbelt and bike helmet use. Physical exam Your health care provider will check your: Height and weight. These may be used to calculate your BMI (body mass index). BMI is a measurement that tells if you are at a healthy weight. Waist circumference. This measures the distance around your waistline. This measurement also tells if you are at a healthy weight and may help predict your risk of certain diseases, such as type 2 diabetes and high blood pressure. Heart rate and blood pressure. Body temperature. Skin for abnormal spots. What immunizations do I need?  Vaccines are usually given at various ages, according to a schedule. Your health care provider will recommend vaccines for you based on your age, medical history, and lifestyle or other factors, such as travel or where you work. What tests do I need? Screening Your health care provider may recommend screening tests for certain conditions. This may include: Lipid and cholesterol levels. Diabetes screening. This is done by checking your blood sugar (glucose) after you have not eaten for a while (fasting). Hepatitis C test. Hepatitis B test. HIV (human  immunodeficiency virus) test. STI (sexually transmitted infection) testing, if you are at risk. Lung cancer screening. Colorectal cancer screening. Prostate cancer screening. Abdominal aortic aneurysm (AAA) screening. You may need this if you are a current or former smoker. Talk with your health care provider about your test results, treatment options, and if necessary, the need for more tests. Follow these instructions at home: Eating and drinking  Eat a diet that includes fresh fruits and vegetables, whole grains, lean protein, and low-fat dairy products. Limit your intake of foods with high amounts of sugar, saturated fats, and salt. Take vitamin and mineral supplements as recommended by your health care provider. Do not drink alcohol if your health care provider tells you not to drink. If you drink alcohol: Limit how much you have to 0-2 drinks a day. Know how much alcohol is in your drink. In the [place] one drink equals one 12 oz bottle of beer (355 mL), one 5 oz glass of wine (148 mL), or one 1 oz glass of hard liquor (44 mL). Lifestyle Brush your teeth every morning and night with fluoride toothpaste. Floss one time each day. Exercise for at least 30 minutes 5 or more days each week. Do not use any products that contain nicotine or tobacco. These products include cigarettes, chewing tobacco, and vaping devices, such as e-cigarettes. If you need help quitting, ask your health care provider. Do not use drugs. If you are sexually active, practice safe sex. Use a condom or other form of protection to prevent STIs. Take aspirin only as told by your health care provider. Make sure that you understand how much to take and what form to take. Work with your health care provider to find out whether it is safe   and beneficial for you to take aspirin daily. Ask your health care provider if you need to take a cholesterol-lowering medicine (statin). Find healthy ways to manage stress, such  as: Meditation, yoga, or listening to music. Journaling. Talking to a trusted person. Spending time with friends and family. Safety Always wear your seat belt while driving or riding in a vehicle. Do not drive: If you have been drinking alcohol. Do not ride with someone who has been drinking. When you are tired or distracted. While texting. If you have been using any mind-altering substances or drugs. Wear a helmet and other protective equipment during sports activities. If you have firearms in your house, make sure you follow all gun safety procedures. Minimize exposure to UV radiation to reduce your risk of skin cancer. What's next? Visit your health care provider once a year for an annual wellness visit. Ask your health care provider how often you should have your eyes and teeth checked. Stay up to date on all vaccines. This information is not intended to replace advice given to you by your health care provider. Make sure you discuss any questions you have with your health care provider. Document Revised: 02/05/2021 Document Reviewed: 02/05/2021 Elsevier Patient Education  2023 [employer]  

## 2022-03-05 NOTE — Progress Notes (Signed)
Name: Carlos Conley   MRN: [id number]    DOB: 1950-12-05   Date:03/09/2022       Progress Note  Subjective  Chief Complaint  Annual Exam  HPI  Patient presents for annual CPE and follow up  Hematuria: he was referred to Urologist in Dec but when they called he said he was not ready to go at the time. He states he is willing to go now. He continues to have intermittent blood at the end of micturition    HTN: he does not take medication daily, he did take it this am and bp is at goal, discussed importance of compliance. He denies chest pain, palpitation or sob  Hyperlipidemia/ Atherosclerosis of Aorta: intolerance to statin therapy, he was given Zetia but stopped on his own, we will recheck labs, advised to resume zetia   History of syphilis: [facility] and states no longer sexually active and does not want to repeat labs  Alcoholisms: he continues to drink , no longer drinking daily, but when drinks about a case a week.   Seizure: as a child, explained alcohol decreases seizure threshold   IPSS Questionnaire (AUA-7): Over the past month.   1)  How often have you had a sensation of not emptying your bladder completely after you finish urinating?  0 - Not at all  2)  How often have you had to urinate again less than two hours after you finished urinating? 0 - Not at all  3)  How often have you found you stopped and started again several times when you urinated?  0 - Not at all  4) How difficult have you found it to postpone urination?  0 - Not at all  5) How often have you had a weak urinary stream?  0 - Not at all  6) How often have you had to push or strain to begin urination?  0 - Not at all  7) How many times did you most typically get up to urinate from the time you went to bed until the time you got up in the morning?  2 - 2 times  Total score:  0-7 mildly symptomatic   8-19 moderately symptomatic   20-35 severely symptomatic     Diet: rich in vegetables,  eats fish once a week, avoiding red meat  Exercise: discussed importance of 150 minutes per week   Depression: phq 9 is negative    03/09/2022    7:55 AM 08/20/2021    2:09 PM 07/24/2021    7:50 AM 06/12/2021    8:47 AM 03/06/2021    8:02 AM  Depression screen PHQ 2/9  Decreased Interest 0 0 0 0 0  Down, Depressed, Hopeless 0 0 0 0 0  PHQ - 2 Score 0 0 0 0 0  Altered sleeping 0 0 0    Tired, decreased energy 0 0 0    Change in appetite 0 0 0    Feeling bad or failure about yourself  0 0 0    Trouble concentrating 0 0 0    Moving slowly or fidgety/restless 0 0 0    Suicidal thoughts 0 0 0    PHQ-9 Score 0 0 0    Difficult doing work/chores  Not difficult at all       Hypertension:  BP Readings from Last 3 Encounters:  03/09/22 138/84  11/05/21 (!) 190/102  08/20/21 (!) 160/88    Obesity: Wt Readings from Last 3 Encounters:  03/09/22 216 lb (98 kg)  11/05/21 220 lb (99.8 kg)  08/20/21 212 lb 1.6 oz (96.2 kg)   BMI Readings from Last 3 Encounters:  03/09/22 33.83 kg/m  11/05/21 33.45 kg/m  08/20/21 33.22 kg/m     Lipids:  Lab Results  Component Value Date   CHOL 219 (H) 03/06/2021   CHOL 230 (H) 04/16/2020   CHOL 218 (H) 07/03/2019   Lab Results  Component Value Date   HDL 52 03/06/2021   HDL 55 04/16/2020   HDL 54 07/03/2019   Lab Results  Component Value Date   LDLCALC 146 (H) 03/06/2021   LDLCALC 157 (H) 04/16/2020   LDLCALC 143 (H) 07/03/2019   Lab Results  Component Value Date   TRIG 100 03/06/2021   TRIG 79 04/16/2020   TRIG 97 07/03/2019   Lab Results  Component Value Date   CHOLHDL 4.2 03/06/2021   CHOLHDL 4.2 04/16/2020   CHOLHDL 4.0 07/03/2019   No results found for: "LDLDIRECT" Glucose:  Glucose, Bld  Date Value Ref Range Status  03/06/2021 98 65 - 99 mg/dL Final    Comment:    .            Fasting reference interval .   06/04/2020 102 (H) 65 - 99 mg/dL Final    Comment:    .            Fasting reference interval . For  someone without known diabetes, a glucose value between 100 and 125 mg/dL is consistent with prediabetes and should be confirmed with a follow-up test. .   04/16/2020 92 65 - 99 mg/dL Final    Comment:    .            Fasting reference interval .    Glucose-Capillary  Date Value Ref Range Status  03/18/2019 147 (H) 70 - 99 mg/dL Final    Flowsheet Row Clinical Support from 06/12/2021 in [facility]  AUDIT-C Score 4      Married STD testing and prevention (HIV/chl/gon/syphilis): N/A Sexual history: currently not sexually active  Hep C Screening: 06/02/18 Skin cancer: Discussed monitoring for atypical lesions Colorectal cancer: 07/28/16  Prostate cancer:   Lab Results  Component Value Date   PSA <0.1 07/25/2019   PSA 0.06 10/22/2016   PSA 0.09 10/21/2015     Lung cancer:  Low Dose CT Chest recommended if Age 73-80 years, 30 pack-year currently smoking OR have quit w/in 15years. Patient  no a candidate for screening   AAA: The USPSTF recommends one-time screening with ultrasonography in men ages 36 to 19 years who have ever smoked. Patient   no, a candidate for screening  ECG:  03/18/19  Vaccines:   Tdap: up to date Shingrix: up to date Pneumonia: up to date Flu: up to date COVID-62: up to date  Advanced Care Planning: A voluntary discussion about advance care planning including the explanation and discussion of advance directives.  Discussed health care proxy and Living will, and the patient was able to identify a health care proxy as wife.  Patient does not have a living will and power of attorney of health care   Patient Active Problem List   Diagnosis Date Noted   Hematuria 08/10/2019   Atherosclerosis of aorta ([place]) 08/10/2019   Fatty liver 08/10/2019   History of total right hip replacement 08/11/2017   Reflux esophagitis    Benign neoplasm of transverse colon    Hyperlipidemia 01/02/2016   Essential  hypertension 01/02/2016    Hyperglycemia 01/02/2016   ED (erectile dysfunction) of organic origin 02/16/2013    Past Surgical History:  Procedure Laterality Date   COLONOSCOPY WITH PROPOFOL N/A 07/28/2016   Procedure: COLONOSCOPY WITH PROPOFOL;  Surgeon: Lucilla Lame, MD;  Location: [facility] ENDOSCOPY;  Service: Endoscopy;  Laterality: N/A;   PROSTATE SURGERY  2002   PROSTATE SURGERY  2002   TOTAL HIP ARTHROPLASTY Right 05/27/2017   Procedure: TOTAL HIP ARTHROPLASTY ANTERIOR APPROACH;  Surgeon: Hessie Knows, MD;  Location: [facility] ORS;  Service: Orthopedics;  Laterality: Right;    Family History  Problem Relation Age of Onset   Cancer Father    Leukemia Father    Hypertension Sister    Hypertension Brother    Hypertension Sister    Hypertension Sister     Social History   Socioeconomic History   Marital status: Married    Spouse name: [name]   Number of children: 3   Years of education: Not on file   Highest education level: Some college, no degree  Occupational History   Occupation: Retired    Comment: Cytogeneticist at a plant   Tobacco Use   Smoking status: Never   Smokeless tobacco: Never  Vaping Use   Vaping Use: Never used  Substance and Sexual Activity   Alcohol use: Yes    Alcohol/week: 2.0 - 3.0 standard drinks of alcohol    Types: 2 - 3 Cans of beer per week    Comment: daily   Drug use: No   Sexual activity: Yes    Partners: Female  Other Topics Concern   Not on file  Social History Narrative   Retired April of 2016      Does not eat meat except for occasional fish   Social Determinants of Health   Financial Resource Strain: Low Risk  (03/09/2022)   Overall Financial Resource Strain (CARDIA)    Difficulty of Paying Living Expenses: Not hard at all  Food Insecurity: No Food Insecurity (03/09/2022)   Hunger Vital Sign    Worried About Running Out of Food in the Last Year: Never true    [place] in the Last Year: Never true  Transportation Needs: No Transportation Needs  (03/09/2022)   PRAPARE - Hydrologist (Medical): No    Lack of Transportation (Non-Medical): No  Physical Activity: Insufficiently Active (03/09/2022)   Exercise Vital Sign    Days of Exercise per Week: 5 days    Minutes of Exercise per Session: 20 min  Stress: No Stress Concern Present (03/09/2022)   [place]    Feeling of Stress : Not at all  Social Connections: [place] (03/09/2022)   Social Connection and Isolation Panel [NHANES]    Frequency of Communication with Friends and Family: Once a week    Frequency of Social Gatherings with Friends and Family: Three times a week    Attends Religious Services: More than 4 times per year    Active Member of Clubs or Organizations: Yes    Attends Archivist Meetings: 1 to 4 times per year    Marital Status: Married  Human resources officer Violence: Not At Risk (03/09/2022)   Humiliation, Afraid, Rape, and Kick questionnaire    Fear of Current or Ex-Partner: No    Emotionally Abused: No    Physically Abused: No    Sexually Abused: No     Current Outpatient Medications:  amLODipine-valsartan (EXFORGE) 5-160 MG tablet, TAKE 1 TABLET BY MOUTH EVERY DAY, Disp: 90 tablet, Rfl: 0   sildenafil (VIAGRA) 100 MG tablet, Take 1 tablet (100 mg total) by mouth daily as needed for erectile dysfunction., Disp: 30 tablet, Rfl: 0   ezetimibe (ZETIA) 10 MG tablet, Take 1 tablet (10 mg total) by mouth daily. (Patient not taking: Reported on 03/09/2022), Disp: 90 tablet, Rfl: 1  Allergies  Allergen Reactions   Lipitor [Atorvastatin] Rash   Pravastatin Itching, Rash and Other (See Comments)    Redness from feet up to knees.   Simvastatin Itching and Rash     ROS  Constitutional: Negative for fever or weight change.  Respiratory: Negative for cough and shortness of breath.   Cardiovascular: Negative for chest pain or palpitations.   Gastrointestinal: Negative for abdominal pain, no bowel changes.  Musculoskeletal: Negative for gait problem or joint swelling.  Skin: Negative for rash.  Neurological: Negative for dizziness or headache.  No other specific complaints in a complete review of systems (except as listed in HPI above).    Objective  Vitals:   03/09/22 0805  BP: 138/84  Pulse: 91  Resp: 16  SpO2: 96%  Weight: 216 lb (98 kg)  Height: '5\' 7"'$  (1.702 m)    Body mass index is 33.83 kg/m.  Physical Exam  Constitutional: Patient appears well-developed and well-nourished. No distress.  HENT: Head: Normocephalic and atraumatic. Ears: B TMs ok, no erythema or effusion; Nose: Nose normal. Mouth/Throat: Oropharynx is clear and moist. No oropharyngeal exudate.  Eyes: Conjunctivae and EOM are normal. Pupils are equal, round, and reactive to light. No scleral icterus.  Neck: Normal range of motion. Neck supple. No JVD present. No thyromegaly present.  Cardiovascular: Normal rate, regular rhythm and normal heart sounds.  No murmur heard. No BLE edema. Pulmonary/Chest: Effort normal and breath sounds normal. No respiratory distress. Abdominal: Soft. Bowel sounds are normal, no distension. There is no tenderness. no masses MALE GENITALIA: Normal descended testes bilaterally, no masses palpated, no hernias, no lesions, no discharge RECTAL: not done  Musculoskeletal: Normal range of motion, no joint effusions. No gross deformities Neurological: he is alert and oriented to person, place, and time. No cranial nerve deficit. Coordination, balance, strength, speech and gait are normal.  Skin: Skin is warm and dry. No rash noted. No erythema.  Psychiatric: Patient has a normal mood and affect. behavior is normal. Judgment and thought content normal.   Fall Risk:    03/09/2022    7:55 AM 08/20/2021    2:08 PM 07/24/2021    7:50 AM 06/12/2021    8:48 AM 03/06/2021    8:01 AM  Fall Risk   Falls in the past year? 0 0 0 0  0  Number falls in past yr: 0 0 0 0 0  Injury with Fall? 0 0 0 0 0  Risk for fall due to : No Fall Risks No Fall Risks No Fall Risks No Fall Risks   Follow up Falls prevention discussed Falls prevention discussed Falls prevention discussed Falls prevention discussed      Functional Status Survey: Is the patient deaf or have difficulty hearing?: No Does the patient have difficulty seeing, even when wearing glasses/contacts?: No Does the patient have difficulty concentrating, remembering, or making decisions?: No Does the patient have difficulty walking or climbing stairs?: No Does the patient have difficulty dressing or bathing?: No Does the patient have difficulty doing errands alone such as visiting a doctor's office or  shopping?: No    Assessment & Plan  1. Well adult exam  - Lipid panel - CBC with Differential/Platelet - COMPLETE METABOLIC PANEL WITH GFR - Hemoglobin A1c  2. Essential hypertension  - amLODipine-valsartan (EXFORGE) 5-160 MG tablet; Take 1 tablet by mouth daily.  Dispense: 90 tablet; Refill: 1 - CBC with Differential/Platelet - COMPLETE METABOLIC PANEL WITH GFR  3. Atherosclerosis of aorta ([facility])  - Lipid panel  4. Alcoholism ([place])   5. Seizures ([facility])   6. Statin intolerance   7. Hyperglycemia  - Hemoglobin A1c  8. Fatty liver   9. Pure hypercholesterolemia   10. History of prostate cancer   11. History of syphilis      -Prostate cancer screening and PSA options (with potential risks and benefits of testing vs not testing) were discussed along with recent recs/guidelines. -USPSTF grade A and B recommendations reviewed with patient; age-appropriate recommendations, preventive care, screening tests, etc discussed and encouraged; healthy living encouraged; see AVS for patient education given to patient -Discussed importance of 150 minutes of physical activity weekly, eat two servings of fish weekly, eat one serving of tree nuts (  cashews, pistachios, pecans, almonds.Marland Kitchen) every other day, eat 6 servings of fruit/vegetables daily and drink plenty of water and avoid sweet beverages.  -Reviewed Health Maintenance: yes

## 2022-03-09 ENCOUNTER — Encounter: Payer: Self-pay | Admitting: Family Medicine

## 2022-03-09 ENCOUNTER — Ambulatory Visit (INDEPENDENT_AMBULATORY_CARE_PROVIDER_SITE_OTHER): Payer: Medicare Other | Admitting: Family Medicine

## 2022-03-09 VITALS — BP 138/84 | HR 91 | Resp 16 | Ht 67.0 in | Wt 216.0 lb

## 2022-03-09 DIAGNOSIS — Z789 Other specified health status: Secondary | ICD-10-CM

## 2022-03-09 DIAGNOSIS — F1020 Alcohol dependence, uncomplicated: Secondary | ICD-10-CM | POA: Diagnosis not present

## 2022-03-09 DIAGNOSIS — Z8546 Personal history of malignant neoplasm of prostate: Secondary | ICD-10-CM

## 2022-03-09 DIAGNOSIS — Z8619 Personal history of other infectious and parasitic diseases: Secondary | ICD-10-CM

## 2022-03-09 DIAGNOSIS — R569 Unspecified convulsions: Secondary | ICD-10-CM

## 2022-03-09 DIAGNOSIS — I700 Atherosclerosis of aorta: Secondary | ICD-10-CM | POA: Diagnosis not present

## 2022-03-09 DIAGNOSIS — K760 Fatty (change of) liver, not elsewhere classified: Secondary | ICD-10-CM

## 2022-03-09 DIAGNOSIS — I10 Essential (primary) hypertension: Secondary | ICD-10-CM | POA: Diagnosis not present

## 2022-03-09 DIAGNOSIS — E7800 Pure hypercholesterolemia, unspecified: Secondary | ICD-10-CM

## 2022-03-09 DIAGNOSIS — R739 Hyperglycemia, unspecified: Secondary | ICD-10-CM

## 2022-03-09 DIAGNOSIS — Z0000 Encounter for general adult medical examination without abnormal findings: Secondary | ICD-10-CM

## 2022-03-09 MED ORDER — AMLODIPINE BESYLATE-VALSARTAN 5-160 MG PO TABS: 1.0000 | ORAL_TABLET | Freq: Every day | ORAL | 1 refills | Status: DC

## 2022-03-10 LAB — CBC WITH DIFFERENTIAL/PLATELET
Absolute Monocytes: 547 cells/uL (ref 200–950)
Basophils Absolute: 57 cells/uL (ref 0–200)
Basophils Relative: 0.8 %
Eosinophils Absolute: 128 cells/uL (ref 15–500)
Eosinophils Relative: 1.8 %
HCT: 48.3 % (ref 38.5–50.0)
Hemoglobin: 16.7 g/dL (ref 13.2–17.1)
Lymphs Abs: 973 cells/uL (ref 850–3900)
MCH: 34.2 pg — ABNORMAL HIGH (ref 27.0–33.0)
MCHC: 34.6 g/dL (ref 32.0–36.0)
MCV: 99 fL (ref 80.0–100.0)
MPV: 9.2 fL (ref 7.5–12.5)
Monocytes Relative: 7.7 %
Neutro Abs: 5396 cells/uL (ref 1500–7800)
Neutrophils Relative %: 76 %
Platelets: 221 10*3/uL (ref 140–400)
RBC: 4.88 10*6/uL (ref 4.20–5.80)
RDW: 14.1 % (ref 11.0–15.0)
Total Lymphocyte: 13.7 %
WBC: 7.1 10*3/uL (ref 3.8–10.8)

## 2022-03-10 LAB — LIPID PANEL
Cholesterol: 226 mg/dL — ABNORMAL HIGH (ref <200)
HDL: 48 mg/dL (ref >=40)
LDL Cholesterol (Calc): 158 mg/dL (calc) — ABNORMAL HIGH
Non-HDL Cholesterol (Calc): 178 mg/dL (calc) — ABNORMAL HIGH (ref <130)
Total CHOL/HDL Ratio: 4.7 (calc) (ref <5.0)
Triglycerides: 91 mg/dL (ref <150)

## 2022-03-10 LAB — COMPLETE METABOLIC PANEL WITH GFR
AG Ratio: 1.4 (calc) (ref 1.0–2.5)
ALT: 21 U/L (ref 9–46)
AST: 42 U/L — ABNORMAL HIGH (ref 10–35)
Albumin: 4.3 g/dL (ref 3.6–5.1)
Alkaline phosphatase (APISO): 74 U/L (ref 35–144)
BUN/Creatinine Ratio: 5 (calc) — ABNORMAL LOW (ref 6–22)
BUN: 5 mg/dL — ABNORMAL LOW (ref 7–25)
CO2: 29 mmol/L (ref 20–32)
Calcium: 9.3 mg/dL (ref 8.6–10.3)
Chloride: 97 mmol/L — ABNORMAL LOW (ref 98–110)
Creat: 1.05 mg/dL (ref 0.70–1.35)
Globulin: 3 g/dL (calc) (ref 1.9–3.7)
Glucose, Bld: 100 mg/dL — ABNORMAL HIGH (ref 65–99)
Potassium: 4.3 mmol/L (ref 3.5–5.3)
Sodium: 137 mmol/L (ref 135–146)
Total Bilirubin: 0.8 mg/dL (ref 0.2–1.2)
Total Protein: 7.3 g/dL (ref 6.1–8.1)
eGFR: 77 mL/min/{1.73_m2} (ref >=60)

## 2022-03-10 LAB — HEMOGLOBIN A1C
Hgb A1c MFr Bld: 5.3 % of total Hgb (ref <5.7)
Mean Plasma Glucose: 105 mg/dL
eAG (mmol/L): 5.8 mmol/L

## 2022-03-11 ENCOUNTER — Other Ambulatory Visit: Payer: Self-pay

## 2022-03-11 DIAGNOSIS — R319 Hematuria, unspecified: Secondary | ICD-10-CM

## 2022-04-01 ENCOUNTER — Encounter: Payer: Self-pay | Admitting: Urology

## 2022-04-01 ENCOUNTER — Ambulatory Visit: Payer: Medicare Other | Admitting: Urology

## 2022-04-01 VITALS — BP 182/99 | HR 89 | Ht 67.0 in | Wt 212.0 lb

## 2022-04-01 DIAGNOSIS — Z8546 Personal history of malignant neoplasm of prostate: Secondary | ICD-10-CM

## 2022-04-01 DIAGNOSIS — R310 Gross hematuria: Secondary | ICD-10-CM

## 2022-04-01 LAB — URINALYSIS, COMPLETE
Bilirubin, UA: NEGATIVE
Glucose, UA: NEGATIVE
Ketones, UA: NEGATIVE
Leukocytes,UA: NEGATIVE
Nitrite, UA: NEGATIVE
Protein,UA: NEGATIVE
RBC, UA: NEGATIVE
Specific Gravity, UA: 1.02 (ref 1.005–1.030)
Urobilinogen, Ur: 0.2 mg/dL (ref 0.2–1.0)
pH, UA: 6 (ref 5.0–7.5)

## 2022-04-01 LAB — MICROSCOPIC EXAMINATION: Bacteria, UA: NONE SEEN

## 2022-04-01 NOTE — Progress Notes (Signed)
04/01/2022 8:43 AM   [name] Jan 09, 1951 [id number]  Referring provider: Steele Sizer, MD [address] [place],  [place] [address]  Chief Complaint  Patient presents with   Hematuria    HPI: Carlos Conley is a 71 y.o. male referred for evaluation of gross hematuria.  Intermittent blood at the end of urination for several months 2 days ago he had what he described as a significant amount of total gross painless hematuria which resolved after 24 hours No flank, abdominal or pelvic pain. Radical retropubic prostatectomy at [facility] January 2003  Gleason 4+3 adenocarcinoma.  Slowly rising PSA and underwent salvage radiation at [facility] completed in 2019 Last PSA 10/31/2021 was stable at 0.02   PMH: Past Medical History:  Diagnosis Date   Arthritis    Cancer [facility])    Prostate   Hyperlipidemia    Hypertension     Surgical History: Past Surgical History:  Procedure Laterality Date   COLONOSCOPY WITH PROPOFOL N/A 07/28/2016   Procedure: COLONOSCOPY WITH PROPOFOL;  Surgeon: Lucilla Lame, MD;  Location: [facility] ENDOSCOPY;  Service: Endoscopy;  Laterality: N/A;   PROSTATE SURGERY  2002   PROSTATE SURGERY  2002   TOTAL HIP ARTHROPLASTY Right 05/27/2017   Procedure: TOTAL HIP ARTHROPLASTY ANTERIOR APPROACH;  Surgeon: Hessie Knows, MD;  Location: [facility] ORS;  Service: Orthopedics;  Laterality: Right;    Home Medications:  Allergies as of 04/01/2022       Reactions   Lipitor [atorvastatin] Rash   Pravastatin Itching, Rash, Other (See Comments)   Redness from feet up to knees.   Simvastatin Itching, Rash        Medication List        Accurate as of April 01, 2022  8:43 AM. If you have any questions, ask your nurse or doctor.          amLODipine-valsartan 5-160 MG tablet Commonly known as: EXFORGE Take 1 tablet by mouth daily.   ezetimibe 10 MG tablet Commonly known as: ZETIA Take 1 tablet (10 mg total) by mouth daily.   sildenafil 100 MG  tablet Commonly known as: Viagra Take 1 tablet (100 mg total) by mouth daily as needed for erectile dysfunction.        Allergies:  Allergies  Allergen Reactions   Lipitor [Atorvastatin] Rash   Pravastatin Itching, Rash and Other (See Comments)    Redness from feet up to knees.   Simvastatin Itching and Rash    Family History: Family History  Problem Relation Age of Onset   Cancer Father    Leukemia Father    Hypertension Sister    Hypertension Brother    Hypertension Sister    Hypertension Sister     Social History:  reports that he has never smoked. He has never used smokeless tobacco. He reports current alcohol use of about 2.0 - 3.0 standard drinks of alcohol per week. He reports that he does not use drugs.   Physical Exam: BP (!) 182/99   Pulse 89   Ht '5\' 7"'$  (1.702 m)   Wt 212 lb (96.2 kg)   BMI 33.20 kg/m   Constitutional:  Alert and oriented, No acute distress. HEENT: [place] AT Respiratory: Normal respiratory effort, no increased work of breathing. GI: Abdomen is soft, nontender, nondistended, no abdominal masses Neurologic: Grossly intact, no focal deficits, moving all 4 extremities. Psychiatric: Normal mood and affect.   Assessment & Plan:   71 y.o. male with intermittent gross hematuria.  History of  prostate cancer s/p salvage radiation for biochemical recurrence in 2019 AUA hematuria risk stratification: High We discussed the recommend evaluation of high risk hematuria which includes a CT urogram and cystoscopy Potential etiologies including radiation cystitis and urothelial carcinoma bladder were discussed All questions were answered and he desires to proceed with further evaluation    Abbie Sons, MD  [facility] [address], [place], [place] [address] [phone]

## 2022-04-02 ENCOUNTER — Encounter: Payer: Self-pay | Admitting: Urology

## 2022-04-30 ENCOUNTER — Other Ambulatory Visit: Payer: Medicare Other | Admitting: Urology

## 2022-05-01 ENCOUNTER — Other Ambulatory Visit: Payer: Medicare Other | Admitting: Urology

## 2022-05-01 ENCOUNTER — Ambulatory Visit
Admission: RE | Admit: 2022-05-01 | Discharge: 2022-05-01 | Disposition: A | Discharge status: Home / Self Care | Payer: Medicare Other | Source: Ambulatory Visit | Attending: Urology | Admitting: Urology

## 2022-05-01 DIAGNOSIS — R310 Gross hematuria: Secondary | ICD-10-CM | POA: Insufficient documentation

## 2022-05-01 LAB — POCT I-STAT CREATININE: Creatinine, Ser: 1.2 mg/dL (ref 0.61–1.24)

## 2022-05-01 MED ORDER — IOHEXOL 300 MG/ML  SOLN
125.0000 mL | Freq: Once | INTRAMUSCULAR | Status: AC | PRN
  Administered 2022-05-01: 125 mL via INTRAVENOUS

## 2022-05-04 ENCOUNTER — Telehealth: Payer: Self-pay | Admitting: *Deleted

## 2022-05-04 NOTE — Telephone Encounter (Signed)
-----   Message from Abbie Sons, MD sent at 05/03/2022 12:16 PM EDT ----- CTU showed no abnormalities.  Please schedule cystoscopy

## 2022-05-04 NOTE — Telephone Encounter (Signed)
Notified patient as instructed, patient pleased. Discussed follow-up appointments, patient agrees  

## 2022-05-23 ENCOUNTER — Emergency Department (HOSPITAL_COMMUNITY)
Admission: EM | Admit: 2022-05-23 | Discharge: 2022-05-23 | Disposition: A | Discharge status: Home / Self Care | Payer: Medicare Other | Attending: Emergency Medicine | Admitting: Emergency Medicine

## 2022-05-23 ENCOUNTER — Other Ambulatory Visit: Payer: Self-pay

## 2022-05-23 ENCOUNTER — Encounter (HOSPITAL_COMMUNITY): Payer: Self-pay

## 2022-05-23 DIAGNOSIS — R310 Gross hematuria: Secondary | ICD-10-CM

## 2022-05-23 DIAGNOSIS — Z8546 Personal history of malignant neoplasm of prostate: Secondary | ICD-10-CM | POA: Insufficient documentation

## 2022-05-23 LAB — COMPREHENSIVE METABOLIC PANEL
ALT: 23 U/L (ref 0–44)
AST: 51 U/L — ABNORMAL HIGH (ref 15–41)
Albumin: 3.7 g/dL (ref 3.5–5.0)
Alkaline Phosphatase: 54 U/L (ref 38–126)
Anion gap: 11 (ref 5–15)
BUN: <5 mg/dL — ABNORMAL LOW (ref 8–23)
CO2: 25 mmol/L (ref 22–32)
Calcium: 9.3 mg/dL (ref 8.9–10.3)
Chloride: 102 mmol/L (ref 98–111)
Creatinine, Ser: 0.96 mg/dL (ref 0.61–1.24)
GFR, Estimated: >60 mL/min (ref >60)
Glucose, Bld: 99 mg/dL (ref 70–99)
Potassium: 4.3 mmol/L (ref 3.5–5.1)
Sodium: 138 mmol/L (ref 135–145)
Total Bilirubin: 1 mg/dL (ref 0.3–1.2)
Total Protein: 7 g/dL (ref 6.5–8.1)

## 2022-05-23 LAB — URINALYSIS, ROUTINE W REFLEX MICROSCOPIC
Bilirubin Urine: NEGATIVE
Glucose, UA: NEGATIVE mg/dL
Ketones, ur: NEGATIVE mg/dL
Leukocytes,Ua: NEGATIVE
Nitrite: POSITIVE — AB
Protein, ur: NEGATIVE mg/dL
Specific Gravity, Urine: 1.015 (ref 1.005–1.030)
pH: 6.5 (ref 5.0–8.0)

## 2022-05-23 LAB — CBC
HCT: 45.9 % (ref 39.0–52.0)
Hemoglobin: 15.5 g/dL (ref 13.0–17.0)
MCH: 34.1 pg — ABNORMAL HIGH (ref 26.0–34.0)
MCHC: 33.8 g/dL (ref 30.0–36.0)
MCV: 100.9 fL — ABNORMAL HIGH (ref 80.0–100.0)
Platelets: 207 10*3/uL (ref 150–400)
RBC: 4.55 MIL/uL (ref 4.22–5.81)
RDW: 13.6 % (ref 11.5–15.5)
WBC: 5.8 10*3/uL (ref 4.0–10.5)
nRBC: 0 % (ref 0.0–0.2)

## 2022-05-23 LAB — URINALYSIS, MICROSCOPIC (REFLEX)
Bacteria, UA: NONE SEEN
RBC / HPF: >50 RBC/hpf (ref 0–5)

## 2022-05-23 LAB — LIPASE, BLOOD: Lipase: 30 U/L (ref 11–51)

## 2022-05-23 MED ORDER — CEPHALEXIN 500 MG PO CAPS: 500.0000 mg | ORAL_CAPSULE | Freq: Two times a day (BID) | ORAL | 0 refills | Status: AC

## 2022-05-23 NOTE — ED Triage Notes (Signed)
Patient complains of hematuria x 2 weeks with mild burning. No hx of same.

## 2022-05-23 NOTE — ED Provider Triage Note (Signed)
Emergency Medicine Provider Triage Evaluation Note  Carlos Conley , a 71 y.o. male  was evaluated in triage.  Pt complains of gross hematuria that has been ongoing for the past 2 months, worsened over the past 3 days.  He does have a cystoscopy scheduled for Monday.  Underwent CT scan 2 weeks ago which showed no concerns per report that is in the chart.  He was concerned due to the amount of hematuria he has been having.  It is at the end of his stream with almost every episode.  Denies lightheadedness.  Review of Systems  Positive: As above Negative: As above  Physical Exam  BP (!) 171/105   Pulse 84   Temp 98.9 F (37.2 C) (Oral)   Resp 16   SpO2 96%  Gen:   Awake, no distress   Resp:  Normal effort  MSK:   Moves extremities without difficulty  Other:    Medical Decision Making  Medically screening exam initiated at 12:31 PM.  Appropriate orders placed.  Carlos Conley was informed that the remainder of the evaluation will be completed by another provider, this initial triage assessment does not replace that evaluation, and the importance of remaining in the ED until their evaluation is complete.     Evlyn Courier, PA-C 05/23/22 1232

## 2022-05-23 NOTE — Discharge Instructions (Signed)
I sent you home with an antibiotic that you will take twice daily.  Your culture should hopefully return by Monday when you see your urologist.

## 2022-05-23 NOTE — ED Provider Notes (Signed)
[facility] EMERGENCY DEPARTMENT Provider Note   CSN: [id number] Arrival date & time: 05/23/22  1106     History  No chief complaint on file.   Carlos Conley is a 71 y.o. male with history of hyperlipidemia, previous history of prostate cancer status post resection and radiation who presents to the emergency department for evaluation of gross hematuria.  Patient states that this has been a fairly chronic issue for him, however it is worsened in the last week or so.  Patient follows with Dr. Bernardo Heater with urology and had a CT hematuria work-up on 9/8 which was ultimately negative.  However, in the last week, patient notes that every time he urinates, after he is finished urinating, frank blood comes out.  This happens almost every single time he urinates which is a progression in his symptoms.  He denies fevers, dysuria, testicular pain, abdominal pain.  He has an appointment with his urologist on Monday, 2 days from now.  HPI     Home Medications Prior to Admission medications   Medication Sig Start Date End Date Taking? Authorizing Provider  cephALEXin (KEFLEX) 500 MG capsule Take 1 capsule (500 mg total) by mouth 2 (two) times daily for 5 days. 05/23/22 05/28/22 Yes Angele Wiemann R, PA-C  amLODipine-valsartan (EXFORGE) 5-160 MG tablet Take 1 tablet by mouth daily. 03/09/22   Steele Sizer, MD  ezetimibe (ZETIA) 10 MG tablet Take 1 tablet (10 mg total) by mouth daily. 07/24/21   Steele Sizer, MD  sildenafil (VIAGRA) 100 MG tablet Take 1 tablet (100 mg total) by mouth daily as needed for erectile dysfunction. 03/06/21   Steele Sizer, MD      Allergies    Lipitor [atorvastatin], Pravastatin, and Simvastatin    Review of Systems   Review of Systems  Constitutional:  Negative for fever.  Genitourinary:  Positive for hematuria. Negative for dysuria and penile pain.    Physical Exam Updated Vital Signs BP (!) 181/115   Pulse 71   Temp 97.7 F (36.5 C)    Resp 16   SpO2 99%  Physical Exam Vitals and nursing note reviewed.  Constitutional:      General: He is not in acute distress.    Appearance: He is not ill-appearing.  HENT:     Head: Atraumatic.  Eyes:     Conjunctiva/sclera: Conjunctivae normal.  Cardiovascular:     Rate and Rhythm: Normal rate and regular rhythm.     Pulses: Normal pulses.     Heart sounds: No murmur heard. Pulmonary:     Effort: Pulmonary effort is normal. No respiratory distress.     Breath sounds: Normal breath sounds.  Abdominal:     General: Abdomen is flat. There is no distension.     Palpations: Abdomen is soft.     Tenderness: There is no abdominal tenderness. There is no right CVA tenderness or left CVA tenderness.  Musculoskeletal:        General: Normal range of motion.     Cervical back: Normal range of motion.  Skin:    General: Skin is warm and dry.     Capillary Refill: Capillary refill takes less than 2 seconds.  Neurological:     General: No focal deficit present.     Mental Status: He is alert.  Psychiatric:        Mood and Affect: Mood normal.     ED Results / Procedures / Treatments   Labs (all labs ordered are listed,  but only abnormal results are displayed) Labs Reviewed  COMPREHENSIVE METABOLIC PANEL - Abnormal; Notable for the following components:      Result Value   BUN <5 (*)    AST 51 (*)    All other components within normal limits  CBC - Abnormal; Notable for the following components:   MCV 100.9 (*)    MCH 34.1 (*)    All other components within normal limits  URINALYSIS, ROUTINE W REFLEX MICROSCOPIC - Abnormal; Notable for the following components:   Color, Urine RED (*)    APPearance CLOUDY (*)    Hgb urine dipstick LARGE (*)    Nitrite POSITIVE (*)    All other components within normal limits  URINE CULTURE  LIPASE, BLOOD  URINALYSIS, MICROSCOPIC (REFLEX)    EKG None  Radiology No results found.  Procedures Procedures    Medications Ordered in  ED Medications - No data to display  ED Course/ Medical Decision Making/ A&P                           Medical Decision Making Amount and/or Complexity of Data Reviewed Labs: ordered.  Risk Prescription drug management.   71 year old male presents to the emergency department for evaluation of gross hematuria that has been fairly chronic but worsening in the last week.  Considered epididymitis, UTI, kidney stone.  Vitals without significant abnormality.  Physical exam benign.  No CVA tenderness.  Considered obtaining CT imaging, however he had this done recently for similar complaints which was negative.  I ordered labs including CMP, CBC, lipase and urinalysis.  UA significant for large amounts of hemoglobin and positive for nitrates.  No leukocytosis or bacteria.  Patient denies using Azo.  Urine culture was ordered and given a nitrite positive urinalysis, I sent him home with course of Keflex until he can be seen by his urologist on Monday.  Patient agrees to this plan.  Discharged home in stable condition. Final Clinical Impression(s) / ED Diagnoses Final diagnoses:  Gross hematuria    Rx / DC Orders ED Discharge Orders          Ordered    cephALEXin (KEFLEX) 500 MG capsule  2 times daily        05/23/22 [address], [place] 05/23/22 2320    Sherwood Gambler, MD 05/27/22 [phone]

## 2022-05-24 LAB — URINE CULTURE: Culture: 20000 — AB

## 2022-05-25 ENCOUNTER — Telehealth (HOSPITAL_BASED_OUTPATIENT_CLINIC_OR_DEPARTMENT_OTHER): Payer: Self-pay

## 2022-05-25 ENCOUNTER — Encounter: Payer: Self-pay | Admitting: Urology

## 2022-05-25 ENCOUNTER — Ambulatory Visit: Payer: Medicare Other | Admitting: Urology

## 2022-05-25 VITALS — BP 187/111 | HR 78 | Ht 70.0 in | Wt 212.0 lb

## 2022-05-25 DIAGNOSIS — R310 Gross hematuria: Secondary | ICD-10-CM

## 2022-05-25 LAB — URINALYSIS, COMPLETE
Bilirubin, UA: NEGATIVE
Glucose, UA: NEGATIVE
Ketones, UA: NEGATIVE
Leukocytes,UA: NEGATIVE
Nitrite, UA: NEGATIVE
Protein,UA: NEGATIVE
RBC, UA: NEGATIVE
Specific Gravity, UA: 1.015 (ref 1.005–1.030)
Urobilinogen, Ur: 0.2 mg/dL (ref 0.2–1.0)
pH, UA: 7 (ref 5.0–7.5)

## 2022-05-25 LAB — MICROSCOPIC EXAMINATION: Bacteria, UA: NONE SEEN

## 2022-05-25 NOTE — Progress Notes (Signed)
   05/25/22  CC:  Chief Complaint  Patient presents with   Cysto    HPI: Refer to my prior note 04/01/2022.  He had increasing hematuria at the end of last week and was seen in the [facility], ED on 05/23/2022.  Urine culture was ordered and he was started on empiric antibiotics.  Urine culture did grow 20,000 colonies of strep viridans.  CTU 05/01/2022 showed no upper tract abnormalities  Blood pressure (!) 187/111, pulse 78, height '5\' 10"'$  (1.778 m), weight 212 lb (96.2 kg). NED. A&Ox3.     Cystoscopy Procedure Note  Patient identification was confirmed, informed consent was obtained, and patient was prepped using Betadine solution.  Lidocaine jelly was administered per urethral meatus.     Pre-Procedure: - Inspection reveals a normal caliber urethral meatus.  Procedure: The flexible cystoscope was introduced without difficulty - No urethral strictures/lesions are present. - Surgically absent prostate  -Open bladder neck with prominent hypervascularity - Bilateral ureteral orifices identified - Bladder mucosa  reveals hypervascularity primarily bladder base - No bladder stones - No trabeculation  Retroflexion shows marked hypervascularity bladder neck   Post-Procedure: - Patient tolerated the procedure well  Assessment/ Plan: Hematuria most likely secondary to radiation cystitis Urine cytology sent For persistent hematuria we discussed options of bladder fulguration and hyperbaric oxygen therapy    Abbie Sons, MD

## 2022-05-25 NOTE — Addendum Note (Signed)
Addended by: Evelina Bucy on: 05/25/2022 03:57 PM   Modules accepted: Orders

## 2022-05-25 NOTE — Telephone Encounter (Signed)
Post ED Visit - Positive Culture Follow-up  Culture report reviewed by antimicrobial stewardship pharmacist: [facility] Team '[]'$  Elenor Quinones, Pharm.D. '[x]'$  Heide Guile, Pharm.D., BCPS AQ-ID '[]'$  Parks Neptune, Pharm.D., BCPS '[]'$  Alycia Rossetti, Pharm.D., BCPS '[]'$  [place], Pharm.D., BCPS, AAHIVP '[]'$  Legrand Como, Pharm.D., BCPS, AAHIVP '[]'$  Salome Arnt, PharmD, BCPS '[]'$  Johnnette Gourd, PharmD, BCPS '[]'$  Hughes Better, PharmD, BCPS '[]'$  Leeroy Cha, PharmD '[]'$  Laqueta Linden, PharmD, BCPS '[]'$  Albertina Parr, PharmD  [facility] Team '[]'$  Leodis Sias, PharmD '[]'$  Lindell Spar, PharmD '[]'$  Royetta Asal, PharmD '[]'$  Graylin Shiver, Rph '[]'$  Rema Fendt) Glennon Mac, PharmD '[]'$  Arlyn Dunning, PharmD '[]'$  Netta Cedars, PharmD '[]'$  Dia Sitter, PharmD '[]'$  Leone Haven, PharmD '[]'$  Gretta Arab, PharmD '[]'$  Theodis Shove, PharmD '[]'$  Peggyann Juba, PharmD '[]'$  Reuel Boom, PharmD   Positive urine culture Treated with Cephalexin, organism sensitive to the same and no further patient follow-up is required at this time.  Glennon Hamilton 05/25/2022, 11:55 AM

## 2022-05-27 LAB — CYTOLOGY - NON PAP

## 2022-05-28 ENCOUNTER — Telehealth: Payer: Self-pay | Admitting: *Deleted

## 2022-05-28 NOTE — Telephone Encounter (Signed)
-----   Message from Abbie Sons, MD sent at 05/28/2022  8:04 AM EDT ----- Urine cytology showed no abnormal cells

## 2022-05-28 NOTE — Telephone Encounter (Signed)
Notified patient as instructed, patient pleased °

## 2022-06-16 ENCOUNTER — Ambulatory Visit: Payer: Medicare Other

## 2022-06-18 ENCOUNTER — Ambulatory Visit (INDEPENDENT_AMBULATORY_CARE_PROVIDER_SITE_OTHER): Payer: Medicare Other

## 2022-06-18 VITALS — BP 134/78

## 2022-06-18 DIAGNOSIS — Z0000 Encounter for general adult medical examination without abnormal findings: Secondary | ICD-10-CM

## 2022-06-18 NOTE — Progress Notes (Signed)
I connected with  Carlos Conley on 06/18/22 by a audio enabled telemedicine application and verified that I am speaking with the correct person using two identifiers.  Patient Location: Home  Provider Location: Office/Clinic  I discussed the limitations of evaluation and management by telemedicine. The patient expressed understanding and agreed to proceed.   Subjective:   Carlos Conley is a 71 y.o. male who presents for Medicare Annual/Subsequent preventive examination.  Review of Systems  Per HPI unless specifically indicated below.  Cardiac Risk Factors include: advanced age (>67mn, >>68women);male gender          Objective:    Today's Vitals   06/18/22 1324  BP: 134/78       06/18/2022    1:24 PM 05/25/2022    9:43 AM 05/23/2022    4:45 PM  Vitals with BMI  Height  '5\' 10"'$    Weight  212 lbs   BMI  301.74  Systolic [id number] Diastolic 78 [id number] Pulse  78 71        06/18/2022    4:01 PM 05/23/2022   12:03 PM 06/12/2021    8:47 AM 02/19/2021   10:12 AM 06/11/2020    8:45 AM 06/06/2019    9:01 AM 03/18/2019    7:33 PM  Advanced Directives  Does Patient Have a Medical Advance Directive? No No No No No No No  Would patient like information on creating a medical advance directive? No - Patient declined No - Patient declined Yes (MAU/Ambulatory/Procedural Areas - Information given) No - Patient declined Yes (MAU/Ambulatory/Procedural Areas - Information given) Yes (MAU/Ambulatory/Procedural Areas - Information given) No - Patient declined    Current Medications (verified) Outpatient Encounter Medications as of 06/18/2022  Medication Sig   amLODipine-valsartan (EXFORGE) 5-160 MG tablet Take 1 tablet by mouth daily.   ezetimibe (ZETIA) 10 MG tablet Take 1 tablet (10 mg total) by mouth daily.   sildenafil (VIAGRA) 100 MG tablet Take 1 tablet (100 mg total) by mouth daily as needed for erectile dysfunction.   No facility-administered encounter  medications on file as of 06/18/2022.    Allergies (verified) Lipitor [atorvastatin], Pravastatin, and Simvastatin   History: Past Medical History:  Diagnosis Date   Arthritis    Cancer ([facility] [place]    Prostate   Hyperlipidemia    Hypertension    Past Surgical History:  Procedure Laterality Date   COLONOSCOPY WITH PROPOFOL N/A 07/28/2016   Procedure: COLONOSCOPY WITH PROPOFOL;  Surgeon: DLucilla Lame MD;  Location: [facility] ENDOSCOPY;  Service: Endoscopy;  Laterality: N/A;   PROSTATE SURGERY  2002   PROSTATE SURGERY  2002   TOTAL HIP ARTHROPLASTY Right 05/27/2017   Procedure: TOTAL HIP ARTHROPLASTY ANTERIOR APPROACH;  Surgeon: MHessie Knows MD;  Location: [facility] ORS;  Service: Orthopedics;  Laterality: Right;   Family History  Problem Relation Age of Onset   Cancer Father    Leukemia Father    Hypertension Sister    Hypertension Brother    Hypertension Sister    Hypertension Sister    Social History   Socioeconomic History   Marital status: Married    Spouse name: Carlos Conley  Number of children: 3   Years of education: Not on file   Highest education level: Some college, no degree  Occupational History   Occupation: Retired    Comment: aCytogeneticistat a plant   Tobacco Use   Smoking status: Never   Smokeless tobacco: Never  VMedia planner  Vaping Use: Never used  Substance and Sexual Activity   Alcohol use: Yes    Alcohol/week: 2.0 - 3.0 standard drinks of alcohol    Types: 2 - 3 Cans of beer per week    Comment: daily   Drug use: No   Sexual activity: Yes    Partners: Female  Other Topics Concern   Not on file  Social History Narrative   Retired April of 2016      Does not eat meat except for occasional fish   Social Determinants of Health   Financial Resource Strain: Low Risk  (06/18/2022)   Overall Financial Resource Strain (CARDIA)    Difficulty of Paying Living Expenses: Not hard at all  Food Insecurity: No Food Insecurity (06/18/2022)   Hunger Vital Sign     Worried About Running Out of Food in the Last Year: Never true    [place] in the Last Year: Never true  Transportation Needs: No Transportation Needs (06/18/2022)   PRAPARE - Hydrologist (Medical): No    Lack of Transportation (Non-Medical): No  Physical Activity: Insufficiently Active (06/18/2022)   Exercise Vital Sign    Days of Exercise per Week: 3 days    Minutes of Exercise per Session: 20 min  Stress: No Stress Concern Present (06/18/2022)   [place]    Feeling of Stress : Not at all  Social Connections: [place] (06/18/2022)   Social Connection and Isolation Panel [NHANES]    Frequency of Communication with Friends and Family: More than three times a week    Frequency of Social Gatherings with Friends and Family: More than three times a week    Attends Religious Services: More than 4 times per year    Active Member of [employer] or Organizations: Yes    Attends Music therapist: More than 4 times per year    Marital Status: Married    Tobacco Counseling Counseling given: Not Answered   Clinical Intake:  Pre-visit preparation completed: No  Pain : No/denies pain     Nutritional Status: BMI 25 -29 Overweight Nutritional Risks: None Diabetes: No  How often do you need to have someone help you when you read instructions, pamphlets, or other written materials from your doctor or pharmacy?: 1 - Never  Diabetic?No  Interpreter Needed?: No  Information entered by :: Donnie Mesa, CMA   Activities of Daily Living    06/18/2022    1:27 PM 03/09/2022    7:55 AM  In your present state of health, do you have any difficulty performing the following activities:  Hearing? 0 0  Vision? 1 0  Difficulty concentrating or making decisions? 0 0  Walking or climbing stairs? 0 0  Dressing or bathing? 0 0  Doing errands, shopping? 0 0    Patient  Care Team: Steele Sizer, MD as PCP - General (Family Medicine) Noreene Filbert, MD as Referring Physician (Radiation Oncology)  Indicate any recent Medical Services you may have received from other than [facility] providers in the past year (date may be approximate). The pt was [facility] at [facility] on 05/23/22 for gross hematuria.     Assessment:   This is a routine wellness examination for Carlos Conley.  Hearing/Vision screen Denies any hearing issues. Denies any issues with his vision. Wear glasses , Annual Eye Exam [facility]   Dietary issues and exercise activities discussed: Current Exercise Habits:  Structured exercise class, Type of exercise: walking, Time (Minutes): 20, Frequency (Times/Week): 3, Weekly Exercise (Minutes/Week): 60, Intensity: Moderate, Exercise limited by: None identified   Goals Addressed             This Visit's Progress    Activity and Exercise Increased       Evidence-based guidance:  Review current exercise levels.  Assess patient perspective on exercise or activity level, barriers to increasing activity, motivation and readiness for change.  Recommend or set healthy exercise goal based on individual tolerance.  Encourage small steps toward making change in amount of exercise or activity.  Urge reduction of sedentary activities or screen time.  Promote group activities within the community or with family or support person.  Consider referral to rehabiliation therapist for assessment and exercise/activity plan.          Depression Screen    06/18/2022    1:27 PM 03/09/2022    7:55 AM 08/20/2021    2:09 PM 07/24/2021    7:50 AM 06/12/2021    8:47 AM 03/06/2021    8:02 AM 12/20/2020    9:13 AM  PHQ 2/9 Scores  PHQ - 2 Score 0 0 0 0 0 0 0  PHQ- 9 Score  0 0 0       Fall Risk    06/18/2022    1:27 PM 03/09/2022    7:55 AM 08/20/2021    2:08 PM 07/24/2021    7:50 AM 06/12/2021    8:48 AM  Fall Risk   Falls in the past year? 0 0 0 0 0   Number falls in past yr: 0 0 0 0 0  Injury with Fall? 0 0 0 0 0  Risk for fall due to : No Fall Risks No Fall Risks No Fall Risks No Fall Risks No Fall Risks  Follow up Falls evaluation completed Falls prevention discussed Falls prevention discussed Falls prevention discussed Falls prevention discussed    FALL RISK PREVENTION PERTAINING TO THE HOME:  Any stairs in or around the home? Yes  If so, are there any without handrails? No  Home free of loose throw rugs in walkways, pet beds, electrical cords, etc? Yes  Adequate lighting in your home to reduce risk of falls? Yes   ASSISTIVE DEVICES UTILIZED TO PREVENT FALLS:  Life alert? No  Use of a cane, walker or w/c? No  Grab bars in the bathroom? Yes  Shower chair or bench in shower? No  Elevated toilet seat or a handicapped toilet? Yes   TIMED UP AND GO:  Was the test performed? No .  Cognitive Function:        06/18/2022    1:28 PM 06/11/2020    8:48 AM 06/06/2019    9:04 AM  6CIT Screen  What Year? 0 points 0 points 0 points  What month? 0 points 0 points 0 points  What time? 0 points 0 points 0 points  Count back from 20 0 points 0 points 0 points  Months in reverse 0 points 0 points 0 points  Repeat phrase 0 points 4 points 4 points  Total Score 0 points 4 points 4 points    Immunizations Immunization History  Administered Date(s) Administered   Fluad Quad(high Dose 65+) 05/15/2019, 06/04/2020, 06/14/2022   Influenza, High Dose Seasonal PF 05/24/2018, 06/26/2021   Influenza,inj,Quad PF,6+ Mos 06/19/2015, 06/02/2016, 07/09/2017   Influenza-Unspecified 06/04/2013   Moderna Covid-19 Vaccine Bivalent Booster 53yr & up 07/25/2021, 06/14/2022   Moderna  Sars-Covid-2 Vaccination 10/18/2019, 11/15/2019, 07/16/2020, 01/14/2021   Pneumococcal Conjugate-13 05/24/2018   Pneumococcal Polysaccharide-23 06/06/2019   Tdap 03/26/2016   Zoster Recombinat (Shingrix) 09/21/2021, 11/23/2021   Zoster, Live 03/26/2016    TDAP  status: Up to date  Flu Vaccine status: Up to date  Pneumococcal vaccine status: Up to date  Covid-19 vaccine status: Completed vaccines  Qualifies for Shingles Vaccine? Yes   Zostavax completed Yes   Shingrix Completed?: Yes  Screening Tests Health Maintenance  Topic Date Due   COVID-19 Vaccine (7 - Moderna risk series) 08/09/2022   Medicare Annual Wellness (AWV)  07/19/2023   TETANUS/TDAP  03/26/2026   COLONOSCOPY (Pts 45-60yr Insurance coverage will need to be confirmed)  07/28/2026   Pneumonia Vaccine 69 Years old  Completed   INFLUENZA VACCINE  Completed   Hepatitis C Screening  Completed   Zoster Vaccines- Shingrix  Completed   HPV VACCINES  Aged Out    Health Maintenance  There are no preventive care reminders to display for this patient.   Colorectal cancer screening: Type of screening: Colonoscopy. Completed 07/28/2016. Repeat every 10 years  Lung Cancer Screening: (Low Dose CT Chest recommended if Age 69-80years, 30 pack-year currently smoking OR have quit w/in 15years.) does not qualify.    Additional Screening:  Hepatitis C Screening: does qualify; Completed 10/10/  Vision Screening: Recommended annual ophthalmology exams for early detection of glaucoma and other disorders of the eye. Is the patient up to date with their annual eye exam?  Yes  Who is the provider or what is the name of the [facility] in which the patient attends annual [facility] exams[facility] is not established with a provider, would they like to be referred to a provider to establish care? No .   Dental Screening: Recommended annual dental exams for proper oral hygiene  Community Resource Referral / Chronic Care Management: CRR required this visit?  No   CCM required this visit?  No      Plan:     I have personally reviewed and noted the following in the patient's chart:   Medical and social history Use of alcohol, tobacco or illicit drugs  Current  medications and supplements including opioid prescriptions. Patient is not currently taking opioid prescriptions. Functional ability and status Nutritional status Physical activity Advanced directives List of other physicians Hospitalizations, surgeries, and ER visits in previous 12 months Vitals Screenings to include cognitive, depression, and falls Referrals and appointments  In addition, I have reviewed and discussed with patient certain preventive protocols, quality metrics, and best practice recommendations. A written personalized care plan for preventive services as well as general preventive health recommendations were provided to patient.     Carlos Conley, Thank you for taking time to come for your Medicare Wellness Visit. I appreciate your ongoing commitment to your health goals. Please review the following plan we discussed and let me know if I can assist you in the future.   These are the goals we discussed:  Goals      Activity and Exercise Increased     Evidence-based guidance:  Review current exercise levels.  Assess patient perspective on exercise or activity level, barriers to increasing activity, motivation and readiness for change.  Recommend or set healthy exercise goal based on individual tolerance.  Encourage small steps toward making change in amount of exercise or activity.  Urge reduction of sedentary activities or screen time.  Promote group activities within the community or  with family or support person.  Consider referral to rehabiliation therapist for assessment and exercise/activity plan.        DIET - INCREASE WATER INTAKE     Recommend drinking 6-8 glasses of water per day     Hypertension Management     CARE PLAN ENTRY (see longitudinal plan of care for additional care plan information)  Current Barriers:  Chronic Disease Management support and education needs related to HTN and HLD.  Case Manager Clinical Goal(s):  Over the next 120  days, patient will: Continue taking all medications as prescribed. Attend all medical appointments as scheduled. Monitor blood pressure and record reading. Adhere to recommended cardiac prudent/heart healthy diet. Follow recommended safety measures to prevent falls and injuries.  Interventions:  Inter-disciplinary care team collaboration (see longitudinal plan of care) Reviewed medications and indications for use. Encouraged to continue taking medications as prescribed. Encouraged to contact care management team with concerns regarding prescription costs. Provided information regarding established blood pressure parameters and indications for notifying provider. Encouraged to monitor a few times a week if unable to monitor daily and record readings. Reports recent reading of 140/89. Discussed current nutritional intake and heart healthy options. Encouraged to continue diet modifications to increase adherence with a cardiac prudent diet. Reports making several diet modifications and attempting to adhere to recommended diet. Eats fish most days with very limited intake of pork and red meat. Also attempting to decrease alcohol consumption. Encouraged to read nutritional labels and monitor consumption of sodium, added sugar and saturated fats. Discussed current activity level. Reviewed safety and fall prevention measures. Encouraged to continue mild activity and exercises as tolerated to improve heart health. Encouraged to keep pathways clear and well lit to prevent falls and injuries. Encouraged to stay hydrated and avoid strenuous activity during hot weather to prevent heat exhaustion. Reviewed scheduled/upcoming provider appointments. Encouraged to attend appointments as scheduled to prevent delays in care. Encouraged to contact care management team with concerns regarding transportation. Discussed plans for ongoing care management and follow up. Provided direct contact information.     Patient Self  Care Activities:  Self administers medications  Calls pharmacy for medication refills Attends church or other social activities Performs ADL's independently Performs IADL's independently   Initial goal documentation         This is a list of the screening recommended for you and due dates:  Health Maintenance  Topic Date Due   COVID-19 Vaccine (7 - Moderna risk series) 08/09/2022   Medicare Annual Wellness Visit  07/19/2023   Tetanus Vaccine  03/26/2026   Colon Cancer Screening  07/28/2026   Pneumonia Vaccine  Completed   Flu Shot  Completed   Hepatitis C Screening: USPSTF Recommendation to screen - Ages 18-79 yo.  Completed   Zoster (Shingles) Vaccine  Completed   HPV Vaccine  Aged [address] [place]   06/18/2022   Nurse Notes: Approximately 30 minute Non-Face-to-Face Visit

## 2022-09-08 NOTE — Progress Notes (Signed)
Name: Carlos Conley   MRN: [id number]    DOB: 1951/06/30   Date:09/09/2022       Progress Note  Subjective  Chief Complaint  Follow Up  HPI  Hematuria: seen by Dr. Bernardo Heater Sep 2023, had CT for hematuria work up and it showed stable mild diffuse bladder wall thickening consistent with chronic cystitis. He denies any hematuria since Summer 2023. He has been drinking more water lately    Atherosclerosis aorta/Hyperlipidemia: found on CT done 04/2022 , he is unable to take statin, he is supposed to be taking Zetia but has not been compliant lately   HTN:he has been compliant with medication now and bp has been at goal, no chest pain or palpitation  History of syphilis: [facility] and states no longer sexually active and does not want to repeat labs  Alcoholisms: he continues to drink but only at home, about 3 beers per day, currently only shooting pool at a senior center and cannot drink there. Occasionally drinks more when working around the house   Seizure: as a child, explained alcohol decreases seizure threshold Unchanged  Fatty liver/hyperglycemia: discussed healthy diet   Neck pain: he states about one month he fell asleep on his porch, when he woke up he tripped on his walking stick and hit his face/left side of jaw on a pole and developed right side neck pain, the pain is intermittent described as dull pain now it is triggered by turning head to the right side and sometimes when sleeping ( turning in bed )   Patient Active Problem List   Diagnosis Date Noted   Alcoholism ([place]) 03/09/2022   Pure hypercholesterolemia 03/09/2022   Statin intolerance 03/09/2022   Seizures ([place]) 03/09/2022   History of prostate cancer 03/09/2022   History of syphilis 03/09/2022   Hematuria 08/10/2019   Atherosclerosis of aorta ([place]) 08/10/2019   Fatty liver 08/10/2019   History of total right hip replacement 08/11/2017   Reflux esophagitis    Benign neoplasm of transverse  colon    Hyperlipidemia 01/02/2016   Essential hypertension 01/02/2016   Hyperglycemia 01/02/2016   ED (erectile dysfunction) of organic origin 02/16/2013    Past Surgical History:  Procedure Laterality Date   COLONOSCOPY WITH PROPOFOL N/A 07/28/2016   Procedure: COLONOSCOPY WITH PROPOFOL;  Surgeon: Lucilla Lame, MD;  Location: [facility] ENDOSCOPY;  Service: Endoscopy;  Laterality: N/A;   PROSTATE SURGERY  2002   PROSTATE SURGERY  2002   TOTAL HIP ARTHROPLASTY Right 05/27/2017   Procedure: TOTAL HIP ARTHROPLASTY ANTERIOR APPROACH;  Surgeon: Hessie Knows, MD;  Location: [facility] ORS;  Service: Orthopedics;  Laterality: Right;    Family History  Problem Relation Age of Onset   Cancer Father    Leukemia Father    Hypertension Sister    Hypertension Brother    Hypertension Sister    Hypertension Sister     Social History   Tobacco Use   Smoking status: Never   Smokeless tobacco: Never  Substance Use Topics   Alcohol use: Yes    Alcohol/week: 2.0 - 3.0 standard drinks of alcohol    Types: 2 - 3 Cans of beer per week    Comment: daily     Current Outpatient Medications:    amLODipine-valsartan (EXFORGE) 5-160 MG tablet, Take 1 tablet by mouth daily., Disp: 90 tablet, Rfl: 1   ezetimibe (ZETIA) 10 MG tablet, Take 1 tablet (10 mg total) by mouth daily., Disp: 90 tablet, Rfl: 1   sildenafil (  VIAGRA) 100 MG tablet, Take 1 tablet (100 mg total) by mouth daily as needed for erectile dysfunction., Disp: 30 tablet, Rfl: 0  Allergies  Allergen Reactions   Lipitor [Atorvastatin] Rash   Pravastatin Itching, Rash and Other (See Comments)    Redness from feet up to knees.   Simvastatin Itching and Rash    I personally reviewed active problem list, medication list, allergies, family history, social history, health maintenance with the patient/caregiver today.   ROS  Constitutional: Negative for fever or weight change.  Respiratory: Negative for cough and shortness of breath.    Cardiovascular: Negative for chest pain or palpitations.  Gastrointestinal: Negative for abdominal pain, no bowel changes.  Musculoskeletal: Negative for gait problem or joint swelling.  Skin: Negative for rash.  Neurological: Negative for dizziness or headache.  No other specific complaints in a complete review of systems (except as listed in HPI above).   Objective  Vitals:   09/09/22 0756  BP: 128/72  Pulse: 96  Resp: 16  SpO2: 98%  Weight: 221 lb (100.2 kg)  Height: '5\' 7"'$  (1.702 m)    Body mass index is 34.61 kg/m.  Physical Exam  Constitutional: Patient appears well-developed and well-nourished. Obese  No distress.  HEENT: head atraumatic, normocephalic, pupils equal and reactive to light, ears normal TM, neck supple pain with rom to right side , throat within normal limits Cardiovascular: Normal rate, regular rhythm and normal heart sounds.  No murmur heard. No BLE edema. Pulmonary/Chest: Effort normal and breath sounds normal. No respiratory distress. Abdominal: Soft.  There is no tenderness. Psychiatric: Patient has a normal mood and affect. behavior is normal. Judgment and thought content normal.   PHQ2/9:    09/09/2022    7:55 AM 06/18/2022    1:27 PM 03/09/2022    7:55 AM 08/20/2021    2:09 PM 07/24/2021    7:50 AM  Depression screen PHQ 2/9  Decreased Interest 0 0 0 0 0  Down, Depressed, Hopeless 0 0 0 0 0  PHQ - 2 Score 0 0 0 0 0  Altered sleeping 0  0 0 0  Tired, decreased energy 0  0 0 0  Change in appetite 0  0 0 0  Feeling bad or failure about yourself  0  0 0 0  Trouble concentrating 0  0 0 0  Moving slowly or fidgety/restless 0  0 0 0  Suicidal thoughts 0  0 0 0  PHQ-9 Score 0  0 0 0  Difficult doing work/chores    Not difficult at all     phq 9 is negative   Fall Risk:    09/09/2022    7:55 AM 06/18/2022    1:27 PM 03/09/2022    7:55 AM 08/20/2021    2:08 PM 07/24/2021    7:50 AM  Fall Risk   Falls in the past year? 1 0 0 0 0  Number  falls in past yr: 0 0 0 0 0  Injury with Fall? 1 0 0 0 0  Risk for fall due to : No Fall Risks No Fall Risks No Fall Risks No Fall Risks No Fall Risks  Follow up Falls prevention discussed Falls evaluation completed Falls prevention discussed Falls prevention discussed Falls prevention discussed      Functional Status Survey: Is the patient deaf or have difficulty hearing?: No Does the patient have difficulty seeing, even when wearing glasses/contacts?: No Does the patient have difficulty concentrating, remembering, or making decisions?: No  Does the patient have difficulty walking or climbing stairs?: No Does the patient have difficulty dressing or bathing?: No Does the patient have difficulty doing errands alone such as visiting a doctor's office or shopping?: No    Assessment & Plan  1. Seizures ([place])  No recent episodes   2. Atherosclerosis of aorta (HCC)  - Lipid panel  3. Alcoholism ([place])  - B12 and Folate Panel - Vitamin B1  4. Essential hypertension  - COMPLETE METABOLIC PANEL WITH GFR - CBC with Differential/Platelet - amLODipine-valsartan (EXFORGE) 5-160 MG tablet; Take 1 tablet by mouth daily.  Dispense: 90 tablet; Refill: 1  5. Statin intolerance   6. Pure hypercholesterolemia  - Lipid panel - ezetimibe (ZETIA) 10 MG tablet; Take 1 tablet (10 mg total) by mouth daily.  Dispense: 90 tablet; Refill: 1  7. Fatty liver  We will recheck labs   8. History of syphilis  Refusing repeat labs  9. History of prostate cancer  Repeat in March, released from Dr. Donella Stade  10. Neck pain on right side  - DG Cervical Spine Complete; Future  11. History of recent fall  - DG Cervical Spine Complete; Future

## 2022-09-09 ENCOUNTER — Ambulatory Visit (INDEPENDENT_AMBULATORY_CARE_PROVIDER_SITE_OTHER): Payer: Medicare Other | Admitting: Family Medicine

## 2022-09-09 ENCOUNTER — Encounter: Payer: Self-pay | Admitting: Family Medicine

## 2022-09-09 VITALS — BP 128/72 | HR 96 | Resp 16 | Ht 67.0 in | Wt 221.0 lb

## 2022-09-09 DIAGNOSIS — R569 Unspecified convulsions: Secondary | ICD-10-CM | POA: Diagnosis not present

## 2022-09-09 DIAGNOSIS — I700 Atherosclerosis of aorta: Secondary | ICD-10-CM | POA: Diagnosis not present

## 2022-09-09 DIAGNOSIS — Z8619 Personal history of other infectious and parasitic diseases: Secondary | ICD-10-CM

## 2022-09-09 DIAGNOSIS — I10 Essential (primary) hypertension: Secondary | ICD-10-CM | POA: Diagnosis not present

## 2022-09-09 DIAGNOSIS — E7800 Pure hypercholesterolemia, unspecified: Secondary | ICD-10-CM

## 2022-09-09 DIAGNOSIS — M542 Cervicalgia: Secondary | ICD-10-CM

## 2022-09-09 DIAGNOSIS — K760 Fatty (change of) liver, not elsewhere classified: Secondary | ICD-10-CM

## 2022-09-09 DIAGNOSIS — Z789 Other specified health status: Secondary | ICD-10-CM

## 2022-09-09 DIAGNOSIS — Z9181 History of falling: Secondary | ICD-10-CM

## 2022-09-09 DIAGNOSIS — Z8546 Personal history of malignant neoplasm of prostate: Secondary | ICD-10-CM

## 2022-09-09 DIAGNOSIS — F1020 Alcohol dependence, uncomplicated: Secondary | ICD-10-CM

## 2022-09-09 MED ORDER — EZETIMIBE 10 MG PO TABS: 10.0000 mg | ORAL_TABLET | Freq: Every day | ORAL | 1 refills | Status: DC

## 2022-09-09 MED ORDER — AMLODIPINE BESYLATE-VALSARTAN 5-160 MG PO TABS: 1.0000 | ORAL_TABLET | Freq: Every day | ORAL | 1 refills | Status: DC

## 2022-09-14 LAB — COMPLETE METABOLIC PANEL WITH GFR
AG Ratio: 1.3 (calc) (ref 1.0–2.5)
ALT: 16 U/L (ref 9–46)
AST: 34 U/L (ref 10–35)
Albumin: 4.1 g/dL (ref 3.6–5.1)
Alkaline phosphatase (APISO): 64 U/L (ref 35–144)
BUN/Creatinine Ratio: 5 (calc) — ABNORMAL LOW (ref 6–22)
BUN: 5 mg/dL — ABNORMAL LOW (ref 7–25)
CO2: 29 mmol/L (ref 20–32)
Calcium: 9.6 mg/dL (ref 8.6–10.3)
Chloride: 100 mmol/L (ref 98–110)
Creat: 0.99 mg/dL (ref 0.70–1.35)
Globulin: 3.1 g/dL (calc) (ref 1.9–3.7)
Glucose, Bld: 105 mg/dL — ABNORMAL HIGH (ref 65–99)
Potassium: 4.8 mmol/L (ref 3.5–5.3)
Sodium: 139 mmol/L (ref 135–146)
Total Bilirubin: 0.6 mg/dL (ref 0.2–1.2)
Total Protein: 7.2 g/dL (ref 6.1–8.1)
eGFR: 82 mL/min/{1.73_m2} (ref >=60)

## 2022-09-14 LAB — CBC WITH DIFFERENTIAL/PLATELET
Absolute Monocytes: 490 cells/uL (ref 200–950)
Basophils Absolute: 41 cells/uL (ref 0–200)
Basophils Relative: 0.7 %
Eosinophils Absolute: 112 cells/uL (ref 15–500)
Eosinophils Relative: 1.9 %
HCT: 45.9 % (ref 38.5–50.0)
Hemoglobin: 16 g/dL (ref 13.2–17.1)
Lymphs Abs: 1103 cells/uL (ref 850–3900)
MCH: 33.1 pg — ABNORMAL HIGH (ref 27.0–33.0)
MCHC: 34.9 g/dL (ref 32.0–36.0)
MCV: 95 fL (ref 80.0–100.0)
MPV: 9.3 fL (ref 7.5–12.5)
Monocytes Relative: 8.3 %
Neutro Abs: 4154 cells/uL (ref 1500–7800)
Neutrophils Relative %: 70.4 %
Platelets: 217 10*3/uL (ref 140–400)
RBC: 4.83 10*6/uL (ref 4.20–5.80)
RDW: 13.7 % (ref 11.0–15.0)
Total Lymphocyte: 18.7 %
WBC: 5.9 10*3/uL (ref 3.8–10.8)

## 2022-09-14 LAB — B12 AND FOLATE PANEL
Folate: 8 ng/mL
Vitamin B-12: 190 pg/mL — ABNORMAL LOW (ref 200–1100)

## 2022-09-14 LAB — LIPID PANEL
Cholesterol: 252 mg/dL — ABNORMAL HIGH (ref <200)
HDL: 50 mg/dL (ref >=40)
LDL Cholesterol (Calc): 178 mg/dL (calc) — ABNORMAL HIGH
Non-HDL Cholesterol (Calc): 202 mg/dL (calc) — ABNORMAL HIGH (ref <130)
Total CHOL/HDL Ratio: 5 (calc) — ABNORMAL HIGH (ref <5.0)
Triglycerides: 111 mg/dL (ref <150)

## 2022-09-14 LAB — VITAMIN B1: Vitamin B1 (Thiamine): <6 nmol/L — ABNORMAL LOW (ref 8–30)

## 2023-03-09 ENCOUNTER — Other Ambulatory Visit: Payer: Self-pay

## 2023-03-09 DIAGNOSIS — E7800 Pure hypercholesterolemia, unspecified: Secondary | ICD-10-CM

## 2023-03-09 DIAGNOSIS — I10 Essential (primary) hypertension: Secondary | ICD-10-CM

## 2023-03-09 NOTE — Progress Notes (Unsigned)
Name: Carlos Conley   MRN: [id number]    DOB: 08/06/1951   Date:03/10/2023       Progress Note  Subjective  Chief Complaint  Follow Up  HPI  Hematuria: seen by Dr. Lonna Cobb Sep 2023, had CT for hematuria work up and it showed stable mild diffuse bladder wall thickening consistent with chronic cystitis. He denies any hematuria since Summer 2023. He is doing well    Atherosclerosis aorta/Hyperlipidemia: found on CT done 04/2022, he is unable to take statin, he states he has been taking Zetia daily , we will recheck labs next visit   HTN:he has been compliant with medication , BP today is elevated - he denies chest pain or palpitation. Discussed adjusting dose but he wants to hold off until tomorrow when he comes for medicare wellness   History of syphilis: [facility] and states no longer sexually active and does not want to repeat labs  Alcoholisms: he continues to drink but only at home, about 4  beers per day, currently only shooting pool at a senior center and cannot drink there. The most he drinks in one day is about 6 beers   Seizure: as a child, explained alcohol decreases seizure threshold Unchanged   Fatty liver/hyperglycemia: reminded patient of healthy diet   Hyperglycemia: we will recheck A1C next visit. Denies polyphagia, polydipsia or polyuria   Patient Active Problem List   Diagnosis Date Noted   Alcoholism (HCC) 03/09/2022   Pure hypercholesterolemia 03/09/2022   Statin intolerance 03/09/2022   Seizures (HCC) 03/09/2022   History of prostate cancer 03/09/2022   History of syphilis 03/09/2022   Hematuria 08/10/2019   Atherosclerosis of aorta (HCC) 08/10/2019   Fatty liver 08/10/2019   History of total right hip replacement 08/11/2017   Reflux esophagitis    Benign neoplasm of transverse colon    Hyperlipidemia 01/02/2016   Essential hypertension 01/02/2016   Hyperglycemia 01/02/2016   ED (erectile dysfunction) of organic origin 02/16/2013     Past Surgical History:  Procedure Laterality Date   COLONOSCOPY WITH PROPOFOL N/A 07/28/2016   Procedure: COLONOSCOPY WITH PROPOFOL;  Surgeon: Midge Minium, MD;  Location: [facility] ENDOSCOPY;  Service: Endoscopy;  Laterality: N/A;   PROSTATE SURGERY  2002   PROSTATE SURGERY  2002   TOTAL HIP ARTHROPLASTY Right 05/27/2017   Procedure: TOTAL HIP ARTHROPLASTY ANTERIOR APPROACH;  Surgeon: Kennedy Bucker, MD;  Location: [facility] ORS;  Service: Orthopedics;  Laterality: Right;    Family History  Problem Relation Age of Onset   Cancer Father    Leukemia Father    Hypertension Sister    Hypertension Brother    Hypertension Sister    Hypertension Sister     Social History   Tobacco Use   Smoking status: Never   Smokeless tobacco: Never  Substance Use Topics   Alcohol use: Yes    Alcohol/week: 2.0 - 3.0 standard drinks of alcohol    Types: 2 - 3 Cans of beer per week    Comment: daily     Current Outpatient Medications:    amLODipine-valsartan (EXFORGE) 5-160 MG tablet, Take 1 tablet by mouth daily., Disp: 90 tablet, Rfl: 1   ezetimibe (ZETIA) 10 MG tablet, Take 1 tablet (10 mg total) by mouth daily., Disp: 90 tablet, Rfl: 1   sildenafil (VIAGRA) 100 MG tablet, Take 1 tablet (100 mg total) by mouth daily as needed for erectile dysfunction., Disp: 30 tablet, Rfl: 0  Allergies  Allergen Reactions   Lipitor [Atorvastatin]  Rash   Pravastatin Itching, Rash and Other (See Comments)    Redness from feet up to knees.   Simvastatin Itching and Rash    I personally reviewed active problem list, medication list, allergies, family history, social history, health maintenance with the patient/caregiver today.   ROS  Constitutional: Negative for fever , positive for mild weight change - trying to lose weight but cutting down on portions .  Respiratory: Negative for cough and shortness of breath.   Cardiovascular: Negative for chest pain or palpitations.  Gastrointestinal: Negative for  abdominal pain, no bowel changes.  Musculoskeletal: Negative for gait problem or joint swelling.  Skin: Negative for rash.  Neurological: Negative for dizziness or headache.  No other specific complaints in a complete review of systems (except as listed in HPI above).   Objective  Vitals:   03/10/23 0737 03/10/23 0754  BP: (!) 152/82 (!) 144/80  Pulse: 93   Resp: 16   SpO2: 97%   Weight: 215 lb (97.5 kg)   Height: 5\' 7"  (1.702 m)     Body mass index is 33.67 kg/m.  Physical Exam  Constitutional: Patient appears well-developed and well-nourished. Obese  No distress.  HEENT: head atraumatic, normocephalic, pupils equal and reactive to light, neck supple Cardiovascular: Normal rate, regular rhythm and normal heart sounds.  No murmur heard. No BLE edema. Pulmonary/Chest: Effort normal and breath sounds normal. No respiratory distress. Abdominal: Soft.  There is no tenderness. Psychiatric: Patient has a normal mood and affect. behavior is normal. Judgment and thought content normal.   PHQ2/9:    03/10/2023    7:37 AM 09/09/2022    7:55 AM 06/18/2022    1:27 PM 03/09/2022    7:55 AM 08/20/2021    2:09 PM  Depression screen PHQ 2/9  Decreased Interest 0 0 0 0 0  Down, Depressed, Hopeless 0 0 0 0 0  PHQ - 2 Score 0 0 0 0 0  Altered sleeping 0 0  0 0  Tired, decreased energy 0 0  0 0  Change in appetite 0 0  0 0  Feeling bad or failure about yourself  0 0  0 0  Trouble concentrating 0 0  0 0  Moving slowly or fidgety/restless 0 0  0 0  Suicidal thoughts 0 0  0 0  PHQ-9 Score 0 0  0 0  Difficult doing work/chores     Not difficult at all    phq 9 is negative   Fall Risk:    03/10/2023    7:37 AM 09/09/2022    7:55 AM 06/18/2022    1:27 PM 03/09/2022    7:55 AM 08/20/2021    2:08 PM  Fall Risk   Falls in the past year? 0 1 0 0 0  Number falls in past yr: 0 0 0 0 0  Injury with Fall? 0 1 0 0 0  Risk for fall due to : No Fall Risks No Fall Risks No Fall Risks No Fall  Risks No Fall Risks  Follow up Falls prevention discussed Falls prevention discussed Falls evaluation completed Falls prevention discussed Falls prevention discussed      Functional Status Survey: Is the patient deaf or have difficulty hearing?: No Does the patient have difficulty seeing, even when wearing glasses/contacts?: No Does the patient have difficulty concentrating, remembering, or making decisions?: No Does the patient have difficulty walking or climbing stairs?: No Does the patient have difficulty dressing or bathing?: No Does the patient  have difficulty doing errands alone such as visiting a doctor's office or shopping?: No    Assessment & Plan  1. Atherosclerosis of aorta (HCC)  On zetia   2. Alcoholism (HCC)  Still drinking daily and not ready to quit, but drinking less   3. Statin intolerance  On zetia   4. Seizures (HCC)  No episodes   5. Essential hypertension  We will recheck tomorrow and consider going up if still elevated   6. Fatty liver  Losing weight   7. History of syphilis  Does not want to recheck levels  8. Vitamin B1 deficiency  - thiamine (VITAMIN B-1) 50 MG tablet; Take 1 tablet (50 mg total) by mouth daily.  Dispense: 100 tablet; Refill: 1  9. Vitamin B12 deficiency  - cyanocobalamin (VITAMIN B12) injection 1,000 mcg  10. Pure hypercholesterolemia  On zetia now  11. Hyperglycemia  We will get A1C yearly

## 2023-03-10 ENCOUNTER — Encounter: Payer: Self-pay | Admitting: Family Medicine

## 2023-03-10 ENCOUNTER — Ambulatory Visit: Payer: Medicare Other | Admitting: Family Medicine

## 2023-03-10 VITALS — BP 144/80 | HR 93 | Resp 16 | Ht 67.0 in | Wt 215.0 lb

## 2023-03-10 DIAGNOSIS — I700 Atherosclerosis of aorta: Secondary | ICD-10-CM

## 2023-03-10 DIAGNOSIS — F1020 Alcohol dependence, uncomplicated: Secondary | ICD-10-CM | POA: Diagnosis not present

## 2023-03-10 DIAGNOSIS — K760 Fatty (change of) liver, not elsewhere classified: Secondary | ICD-10-CM | POA: Diagnosis not present

## 2023-03-10 DIAGNOSIS — Z8619 Personal history of other infectious and parasitic diseases: Secondary | ICD-10-CM | POA: Diagnosis not present

## 2023-03-10 DIAGNOSIS — R739 Hyperglycemia, unspecified: Secondary | ICD-10-CM | POA: Diagnosis not present

## 2023-03-10 DIAGNOSIS — E519 Thiamine deficiency, unspecified: Secondary | ICD-10-CM | POA: Diagnosis not present

## 2023-03-10 DIAGNOSIS — R569 Unspecified convulsions: Secondary | ICD-10-CM

## 2023-03-10 DIAGNOSIS — Z789 Other specified health status: Secondary | ICD-10-CM | POA: Diagnosis not present

## 2023-03-10 DIAGNOSIS — E538 Deficiency of other specified B group vitamins: Secondary | ICD-10-CM

## 2023-03-10 DIAGNOSIS — E7800 Pure hypercholesterolemia, unspecified: Secondary | ICD-10-CM | POA: Diagnosis not present

## 2023-03-10 DIAGNOSIS — I10 Essential (primary) hypertension: Secondary | ICD-10-CM

## 2023-03-10 MED ORDER — CYANOCOBALAMIN 1000 MCG/ML IJ SOLN
1000.0000 ug | Freq: Once | INTRAMUSCULAR | Status: AC
  Administered 2023-03-10: 1000 ug via INTRAMUSCULAR

## 2023-03-10 MED ORDER — VITAMIN B-1 50 MG PO TABS: 50.0000 mg | ORAL_TABLET | Freq: Every day | ORAL | 1 refills | Status: AC

## 2023-03-10 NOTE — Progress Notes (Signed)
Name: Carlos Conley   MRN: [id number]    DOB: [birth year]   Date:03/11/2023       Progress Note  Subjective  Chief Complaint  Annual Exam  HPI  Patient presents for annual CPE.  IPSS Questionnaire (AUA-7): Over the past month.   1)  How often have you had a sensation of not emptying your bladder completely after you finish urinating?  0 - Not at all  2)  How often have you had to urinate again less than two hours after you finished urinating? 0 - Not at all  3)  How often have you found you stopped and started again several times when you urinated?  0 - Not at all  4) How difficult have you found it to postpone urination?  0 - Not at all  5) How often have you had a weak urinary stream?  0 - Not at all  6) How often have you had to push or strain to begin urination?  0 - Not at all  7) How many times did you most typically get up to urinate from the time you went to bed until the time you got up in the morning?  1 - 1 time  Total score:  0-7 mildly symptomatic   8-19 moderately symptomatic   20-35 severely symptomatic     Diet: eats mostly at home , does not eat meat, eats fish about once a week, likely the cause of B12 deficiency  Exercise: discussed 150 minutes  Last Dental Exam: he is due for an exam  Last Eye Exam: he is due for an eye exam  Depression: phq 9 is negative    03/11/2023    7:56 AM 03/10/2023    7:37 AM 09/09/2022    7:55 AM 06/18/2022    1:27 PM 03/09/2022    7:55 AM  Depression screen PHQ 2/9  Decreased Interest 0 0 0 0 0  Down, Depressed, Hopeless 0 0 0 0 0  PHQ - 2 Score 0 0 0 0 0  Altered sleeping 0 0 0  0  Tired, decreased energy 0 0 0  0  Change in appetite 0 0 0  0  Feeling bad or failure about yourself  0 0 0  0  Trouble concentrating 0 0 0  0  Moving slowly or fidgety/restless 0 0 0  0  Suicidal thoughts 0 0 0  0  PHQ-9 Score 0 0 0  0    Hypertension:  BP Readings from Last 3 Encounters:  03/11/23 136/72  03/10/23 (!) 144/80  09/09/22  128/72    Obesity: Wt Readings from Last 3 Encounters:  03/11/23 215 lb (97.5 kg)  03/10/23 215 lb (97.5 kg)  09/09/22 221 lb (100.2 kg)   BMI Readings from Last 3 Encounters:  03/11/23 33.67 kg/m  03/10/23 33.67 kg/m  09/09/22 34.61 kg/m     Lipids:  Lab Results  Component Value Date   CHOL 252 (H) 09/09/2022   CHOL 226 (H) 03/09/2022   CHOL 219 (H) 03/06/2021   Lab Results  Component Value Date   HDL 50 09/09/2022   HDL 48 03/09/2022   HDL 52 03/06/2021   Lab Results  Component Value Date   LDLCALC 178 (H) 09/09/2022   LDLCALC 158 (H) 03/09/2022   LDLCALC 146 (H) 03/06/2021   Lab Results  Component Value Date   TRIG 111 09/09/2022   TRIG 91 03/09/2022   TRIG 100 03/06/2021   Lab Results  Component Value Date   CHOLHDL 5.0 (H) 09/09/2022   CHOLHDL 4.7 03/09/2022   CHOLHDL 4.2 03/06/2021   No results found for: "LDLDIRECT" Glucose:  Glucose, Bld  Date Value Ref Range Status  09/09/2022 105 (H) 65 - 99 mg/dL Final    Comment:    .            Fasting reference interval . For someone without known diabetes, a glucose value between 100 and 125 mg/dL is consistent with prediabetes and should be confirmed with a follow-up test. .   05/23/2022 99 70 - 99 mg/dL Final    Comment:    Glucose reference range applies only to samples taken after fasting for at least 8 hours.  03/09/2022 100 (H) 65 - 99 mg/dL Final    Comment:    .            Fasting reference interval . For someone without known diabetes, a glucose value between 100 and 125 mg/dL is consistent with prediabetes and should be confirmed with a follow-up test. .    Glucose-Capillary  Date Value Ref Range Status  03/18/2019 147 (H) 70 - 99 mg/dL Final    Flowsheet Row Clinical Support from 06/18/2022 in [facility]  AUDIT-C Score 1       Married STD testing and prevention (HIV/chl/gon/syphilis): he refuses getting rechecked  Sexual history: not  sexually active in the past year  Hep C Screening: 06/02/18 Skin cancer: Discussed monitoring for atypical lesions Colorectal cancer: 07/28/16  Prostate cancer:   Lab Results  Component Value Date   PSA <0.1 07/25/2019   PSA 0.06 10/22/2016   PSA 0.09 10/21/2015     Lung cancer:  Low Dose CT Chest recommended if Age 32-80 years, 30 pack-year currently smoking OR have quit w/in 15years. Patient  no a candidate for screening   AAA: The USPSTF recommends one-time screening with ultrasonography in men ages 13 to 75 years who have ever smoked. Patient  not a candidate for screening  ECG:  03/18/19  Vaccines:   RSV: discussed with patient  Tdap: up to date Shingrix: up to date Pneumonia: up to date Flu: up to date COVID-19: up to date  Advanced Care Planning: A voluntary discussion about advance care planning including the explanation and discussion of advance directives.  Discussed health care proxy and Living will, and the patient was able to identify a health care proxy as wife.  Patient does not have a living will and power of attorney of health care   Patient Active Problem List   Diagnosis Date Noted   Pescetarian 03/11/2023   Alcoholism (HCC) 03/09/2022   Pure hypercholesterolemia 03/09/2022   Statin intolerance 03/09/2022   Seizures (HCC) 03/09/2022   History of prostate cancer 03/09/2022   History of syphilis 03/09/2022   Hematuria 08/10/2019   Atherosclerosis of aorta (HCC) 08/10/2019   Fatty liver 08/10/2019   History of total right hip replacement 08/11/2017   Reflux esophagitis    Benign neoplasm of transverse colon    Hyperlipidemia 01/02/2016   Essential hypertension 01/02/2016   Hyperglycemia 01/02/2016   ED (erectile dysfunction) of organic origin 02/16/2013    Past Surgical History:  Procedure Laterality Date   COLONOSCOPY WITH PROPOFOL N/A 07/28/2016   Procedure: COLONOSCOPY WITH PROPOFOL;  Surgeon: Midge Minium, MD;  Location: [facility] ENDOSCOPY;  Service:  Endoscopy;  Laterality: N/A;   PROSTATE SURGERY  2002   PROSTATE SURGERY  2002   TOTAL HIP  ARTHROPLASTY Right 05/27/2017   Procedure: TOTAL HIP ARTHROPLASTY ANTERIOR APPROACH;  Surgeon: Kennedy Bucker, MD;  Location: [facility] ORS;  Service: Orthopedics;  Laterality: Right;    Family History  Problem Relation Age of Onset   Cancer Father    Leukemia Father    Hypertension Sister    Hypertension Brother    Hypertension Sister    Hypertension Sister     Social History   Socioeconomic History   Marital status: Married    Spouse name: Windell Moulding   Number of children: 3   Years of education: Not on file   Highest education level: Some college, no degree  Occupational History   Occupation: Retired    Comment: Biomedical scientist at a plant   Tobacco Use   Smoking status: Never   Smokeless tobacco: Never  Vaping Use   Vaping status: Never Used  Substance and Sexual Activity   Alcohol use: Yes    Alcohol/week: 2.0 - 3.0 standard drinks of alcohol    Types: 2 - 3 Cans of beer per week    Comment: daily   Drug use: No   Sexual activity: Yes    Partners: Female  Other Topics Concern   Not on file  Social History Narrative   Retired April of 2016      Does not eat meat except for occasional fish   Social Determinants of Health   Financial Resource Strain: Low Risk  (03/11/2023)   Overall Financial Resource Strain (CARDIA)    Difficulty of Paying Living Expenses: Not hard at all  Food Insecurity: No Food Insecurity (03/11/2023)   Hunger Vital Sign    Worried About Running Out of Food in the Last Year: Never true    Ran Out of Food in the Last Year: Never true  Transportation Needs: No Transportation Needs (03/11/2023)   PRAPARE - Administrator, Civil Service (Medical): No    Lack of Transportation (Non-Medical): No  Physical Activity: Insufficiently Active (03/11/2023)   Exercise Vital Sign    Days of Exercise per Week: 3 days    Minutes of Exercise per Session: 30 min   Stress: No Stress Concern Present (03/11/2023)   [employer] of Occupational Health - Occupational Stress Questionnaire    Feeling of Stress : Not at all  Social Connections: Socially Integrated (03/11/2023)   Social Connection and Isolation Panel [NHANES]    Frequency of Communication with Friends and Family: More than three times a week    Frequency of Social Gatherings with Friends and Family: More than three times a week    Attends Religious Services: More than 4 times per year    Active Member of [employer] or Organizations: Yes    Attends Engineer, structural: More than 4 times per year    Marital Status: Married  Catering manager Violence: Not At Risk (03/11/2023)   Humiliation, Afraid, Rape, and Kick questionnaire    Fear of Current or Ex-Partner: No    Emotionally Abused: No    Physically Abused: No    Sexually Abused: No     Current Outpatient Medications:    amLODipine-valsartan (EXFORGE) 5-160 MG tablet, Take 1 tablet by mouth daily., Disp: 90 tablet, Rfl: 1   ezetimibe (ZETIA) 10 MG tablet, Take 1 tablet (10 mg total) by mouth daily., Disp: 90 tablet, Rfl: 1   sildenafil (VIAGRA) 100 MG tablet, Take 1 tablet (100 mg total) by mouth daily as needed for erectile dysfunction., Disp: 30 tablet, Rfl:  0   thiamine (VITAMIN B-1) 50 MG tablet, Take 1 tablet (50 mg total) by mouth daily., Disp: 100 tablet, Rfl: 1  Allergies  Allergen Reactions   Lipitor [Atorvastatin] Rash   Pravastatin Itching, Rash and Other (See Comments)    Redness from feet up to knees.   Simvastatin Itching and Rash     ROS  Constitutional: Negative for fever or weight change.  Respiratory: Negative for cough and shortness of breath.   Cardiovascular: Negative for chest pain or palpitations.  Gastrointestinal: Negative for abdominal pain, no bowel changes.  Musculoskeletal: Negative for gait problem or joint swelling.  Skin: Negative for rash.  Neurological: Negative for dizziness or  headache.  No other specific complaints in a complete review of systems (except as listed in HPI above).    Objective  Vitals:   03/11/23 0756  BP: 136/72  Pulse: 86  Resp: 16  SpO2: 98%  Weight: 215 lb (97.5 kg)  Height: 5\' 7"  (1.702 m)    Body mass index is 33.67 kg/m.  Physical Exam  Constitutional: Patient appears well-developed and well-nourished. No distress.  HENT: Head: Normocephalic and atraumatic. Ears: B TMs ok, no erythema or effusion; Nose: Nose normal. Mouth/Throat: Oropharynx is clear and moist. No oropharyngeal exudate.  Eyes: Conjunctivae and EOM are normal. Pupils are equal, round, and reactive to light. No scleral icterus.  Neck: Normal range of motion. Neck supple. No JVD present. No thyromegaly present.  Cardiovascular: Normal rate, regular rhythm and normal heart sounds.  No murmur heard. No BLE edema. Pulmonary/Chest: Effort normal and breath sounds normal. No respiratory distress. Abdominal: Soft. Bowel sounds are normal, no distension. There is no tenderness. no masses MALE GENITALIA: Normal descended testes bilaterally, no masses palpated, no hernias, no lesions, no discharge RECTAL s/p prostatectomy  Musculoskeletal: Normal range of motion, no joint effusions. No gross deformities Neurological: he is alert and oriented to person, place, and time. No cranial nerve deficit. Coordination, balance, strength, speech and gait are normal.  Skin: Skin is warm and dry. No rash noted. No erythema.  Psychiatric: Patient has a normal mood and affect. behavior is normal. Judgment and thought content normal.    Fall Risk:    03/11/2023    7:56 AM 03/10/2023    7:37 AM 09/09/2022    7:55 AM 06/18/2022    1:27 PM 03/09/2022    7:55 AM  Fall Risk   Falls in the past year? 0 0 1 0 0  Number falls in past yr: 0 0 0 0 0  Injury with Fall? 0 0 1 0 0  Risk for fall due to : No Fall Risks No Fall Risks No Fall Risks No Fall Risks No Fall Risks  Follow up Falls  prevention discussed Falls prevention discussed Falls prevention discussed Falls evaluation completed Falls prevention discussed     Functional Status Survey: Is the patient deaf or have difficulty hearing?: No Does the patient have difficulty seeing, even when wearing glasses/contacts?: No Does the patient have difficulty concentrating, remembering, or making decisions?: No Does the patient have difficulty walking or climbing stairs?: No Does the patient have difficulty dressing or bathing?: No Does the patient have difficulty doing errands alone such as visiting a doctor's office or shopping?: No    Assessment & Plan  1. Well adult exam   2. Pescetarian  Had B12 shot yesterday   3. History of prostate cancer  - PSA     -Prostate cancer screening and PSA options (  with potential risks and benefits of testing vs not testing) were discussed along with recent recs/guidelines. -USPSTF grade A and B recommendations reviewed with patient; age-appropriate recommendations, preventive care, screening tests, etc discussed and encouraged; healthy living encouraged; see AVS for patient education given to patient -Discussed importance of 150 minutes of physical activity weekly, eat two servings of fish weekly, eat one serving of tree nuts ( cashews, pistachios, pecans, almonds.Marland Kitchen) every other day, eat 6 servings of fruit/vegetables daily and drink plenty of water and avoid sweet beverages.  -Reviewed Health Maintenance: yes

## 2023-03-11 ENCOUNTER — Ambulatory Visit: Payer: Medicare Other | Admitting: Family Medicine

## 2023-03-11 ENCOUNTER — Encounter: Payer: Self-pay | Admitting: Family Medicine

## 2023-03-11 ENCOUNTER — Other Ambulatory Visit: Payer: Self-pay | Admitting: Family Medicine

## 2023-03-11 VITALS — BP 136/72 | HR 86 | Resp 16 | Ht 67.0 in | Wt 215.0 lb

## 2023-03-11 DIAGNOSIS — Z0000 Encounter for general adult medical examination without abnormal findings: Secondary | ICD-10-CM | POA: Diagnosis not present

## 2023-03-11 DIAGNOSIS — Z8546 Personal history of malignant neoplasm of prostate: Secondary | ICD-10-CM

## 2023-03-11 DIAGNOSIS — Z789 Other specified health status: Secondary | ICD-10-CM | POA: Diagnosis not present

## 2023-03-11 DIAGNOSIS — E7800 Pure hypercholesterolemia, unspecified: Secondary | ICD-10-CM

## 2023-03-11 NOTE — Patient Instructions (Signed)
RSV vaccine get it at local pharmacy   Over the counter supplement is vitamin B1

## 2023-03-12 ENCOUNTER — Other Ambulatory Visit: Payer: Self-pay

## 2023-03-12 DIAGNOSIS — I10 Essential (primary) hypertension: Secondary | ICD-10-CM

## 2023-03-12 DIAGNOSIS — E7800 Pure hypercholesterolemia, unspecified: Secondary | ICD-10-CM

## 2023-03-12 LAB — PSA: PSA: 0.05 ng/mL (ref <=4.00)

## 2023-03-12 MED ORDER — EZETIMIBE 10 MG PO TABS: 10.0000 mg | ORAL_TABLET | Freq: Every day | ORAL | 1 refills | Status: DC

## 2023-03-12 MED ORDER — AMLODIPINE BESYLATE-VALSARTAN 5-160 MG PO TABS: 1.0000 | ORAL_TABLET | Freq: Every day | ORAL | 1 refills | Status: DC

## 2023-03-26 ENCOUNTER — Other Ambulatory Visit: Payer: Self-pay | Admitting: Family Medicine

## 2023-03-26 DIAGNOSIS — E7800 Pure hypercholesterolemia, unspecified: Secondary | ICD-10-CM

## 2023-03-26 DIAGNOSIS — I10 Essential (primary) hypertension: Secondary | ICD-10-CM

## 2023-03-26 NOTE — Telephone Encounter (Signed)
Refilled 03/12/23 . Requested Prescriptions  Refused Prescriptions Disp Refills   amLODipine-valsartan (EXFORGE) 5-160 MG tablet 90 tablet 1    Sig: Take 1 tablet by mouth daily.     Cardiovascular: CCB + ARB Combos Failed - 03/26/2023 11:27 AM      Failed - K in normal range and within 180 days    Potassium  Date Value Ref Range Status  09/09/2022 4.8 3.5 - 5.3 mmol/L Final         Failed - Cr in normal range and within 180 days    Creat  Date Value Ref Range Status  09/09/2022 0.99 0.70 - 1.35 mg/dL Final   Creatinine, Urine  Date Value Ref Range Status  07/03/2019 37 20 - 320 mg/dL Final         Failed - Na in normal range and within 180 days    Sodium  Date Value Ref Range Status  09/09/2022 139 135 - 146 mmol/L Final  02/04/2016 139 134 - 144 mmol/L Final         Passed - Patient is not pregnant      Passed - Last BP in normal range    BP Readings from Last 1 Encounters:  03/11/23 136/72         Passed - Valid encounter within last 6 months    Recent Outpatient Visits           2 weeks ago Well adult exam   [facility] Alba Cory, MD   2 weeks ago Atherosclerosis of aorta [facility])   [facility] Alba Cory, MD   6 months ago Seizures [facility])    [facility] Alba Cory, MD   1 year ago Well adult exam   [facility]ba [facility], MD   1 year ago Bursitis of left deltoid   [facility] Alba Cory, MD       Future Appointments             In 2 months Alba Cory, MD [facility]   In 11 months Alba Cory, MD [facility], [facility]             ezetimibe (ZETIA) 10 MG tablet 90 tablet 1    Sig: Take 1 tablet (10 mg total) by mouth daily.     Cardiovascular:  Antilipid - Sterol Transport Inhibitors Failed - 03/26/2023 11:27 AM      Failed - Lipid  Panel in normal range within the last 12 months    Cholesterol, Total  Date Value Ref Range Status  02/04/2016 184 100 - 199 mg/dL Final   Cholesterol  Date Value Ref Range Status  09/09/2022 252 (H) <200 mg/dL Final   LDL Cholesterol (Calc)  Date Value Ref Range Status  09/09/2022 178 (H) mg/dL (calc) Final    Comment:    Reference range: <100 . Desirable range <100 mg/dL for primary prevention;   <70 mg/dL for patients with CHD or diabetic patients  with > or = 2 CHD risk factors. Marland Kitchen LDL-C is now calculated using the Martin-Hopkins  calculation, which is a validated novel method providing  better accuracy than the Friedewald equation in the  estimation of LDL-C.  Horald Pollen et al. Lenox Ahr. 1610;960(45): 2061-2068  (http://education.[employer].com/faq/FAQ164)    HDL  Date Value Ref Range Status  09/09/2022 50 > OR = 40 mg/dL Final  40/98/1191 56 >  39 mg/dL Final   Triglycerides  Date Value Ref Range Status  09/09/2022 111 <150 mg/dL Final         Passed - AST in normal range and within 360 days    AST  Date Value Ref Range Status  09/09/2022 34 10 - 35 U/L Final         Passed - ALT in normal range and within 360 days    ALT  Date Value Ref Range Status  09/09/2022 16 9 - 46 U/L Final         Passed - Patient is not pregnant      Passed - Valid encounter within last 12 months    Recent Outpatient Visits           2 weeks ago Well adult exam   [facility] Alba Cory, MD   2 weeks ago Atherosclerosis of aorta [facility])   [facility] Alba Cory, MD   6 months ago [facility]iz[facility]   [facility] Alba Cory, MD   1 year ago Well adult exam   [facility] Alba Cory, MD   1 year ago Bursitis of left deltoid   [facility] Alba Cory, MD       Future Appointments             In 2 months Carlynn Purl, Danna Hefty,  MD [facility], [facility]   In 11 months Alba Cory, MD [facility], [facility]

## 2023-03-26 NOTE — Telephone Encounter (Signed)
Medication Refill - Medication: amLODipine-valsartan (EXFORGE) 5-160 MG tablet /ezetimibe (ZETIA) 10 MG tablet  Shows received at pharmacy 07/19 but pt still doesn't have these meds   Has the patient contacted their pharmacy? no (Agent: If no, request that the patient contact the pharmacy for the refill. If patient does not wish to contact the pharmacy document the reason why and proceed with request.) (Agent: If yes, when and what did the pharmacy advise?)pt will call pharmacy to check status of meds  Preferred Pharmacy (with phone number or street name): [facility] - [place] - [address], [place] [address] Phone: [phone]  Fax: [phone]   Has the patient been seen for an appointment in the last year OR does the patient have an upcoming appointment? yes  Agent: Please be advised that RX refills may take up to 3 business days. We ask that you follow-up with your pharmacy.

## 2023-04-09 ENCOUNTER — Ambulatory Visit (INDEPENDENT_AMBULATORY_CARE_PROVIDER_SITE_OTHER): Payer: Medicare Other

## 2023-04-09 VITALS — Ht 67.0 in | Wt 212.0 lb

## 2023-04-09 DIAGNOSIS — Z0000 Encounter for general adult medical examination without abnormal findings: Secondary | ICD-10-CM | POA: Diagnosis not present

## 2023-04-09 NOTE — Patient Instructions (Signed)
Carlos Conley , Thank you for taking time to come for your Medicare Wellness Visit. I appreciate your ongoing commitment to your health goals. Please review the following plan we discussed and let me know if I can assist you in the future.   Referrals/Orders/Follow-Ups/Clinician Recommendations: none  This is a list of the screening recommended for you and due dates:  Health Maintenance  Topic Date Due   COVID-19 Vaccine (7 - 2023-24 season) 08/09/2022   Flu Shot  03/25/2023   Medicare Annual Wellness Visit  04/08/2024   DTaP/Tdap/Td vaccine (2 - Td or Tdap) 03/26/2026   Colon Cancer Screening  07/28/2026   Pneumonia Vaccine  Completed   Hepatitis C Screening  Completed   Zoster (Shingles) Vaccine  Completed   HPV Vaccine  Aged Out    Advanced directives: (Provided) Advance directive discussed with you today. I have provided a copy for you to complete at home and have notarized. Once this is complete, please bring a copy in to our office so we can scan it into your chart.    Pt says he has the paperwork at home to complete  Next Medicare Annual Wellness Visit scheduled for next year: Yes 04/13/2024 @ 11:15am telephone  Preventive Care 65 Years and Older, Male  Preventive care refers to lifestyle choices and visits with your health care provider that can promote health and wellness. What does preventive care include? A yearly physical exam. This is also called an annual well check. Dental exams once or twice a year. Routine eye exams. Ask your health care provider how often you should have your eyes checked. Personal lifestyle choices, including: Daily care of your teeth and gums. Regular physical activity. Eating a healthy diet. Avoiding tobacco and drug use. Limiting alcohol use. Practicing safe sex. Taking low doses of aspirin every day. Taking vitamin and mineral supplements as recommended by your health care provider. What happens during an annual well check? The services  and screenings done by your health care provider during your annual well check will depend on your age, overall health, lifestyle risk factors, and family history of disease. Counseling  Your health care provider may ask you questions about your: Alcohol use. Tobacco use. Drug use. Emotional well-being. Home and relationship well-being. Sexual activity. Eating habits. History of falls. Memory and ability to understand (cognition). Work and work Astronomer. Screening  You may have the following tests or measurements: Height, weight, and BMI. Blood pressure. Lipid and cholesterol levels. These may be checked every 5 years, or more frequently if you are over 17 years old. Skin check. Lung cancer screening. You may have this screening every year starting at age 19 if you have a 30-pack-year history of smoking and currently smoke or have quit within the past 15 years. Fecal occult blood test (FOBT) of the stool. You may have this test every year starting at age 21. Flexible sigmoidoscopy or colonoscopy. You may have a sigmoidoscopy every 5 years or a colonoscopy every 10 years starting at age 31. Prostate cancer screening. Recommendations will vary depending on your family history and other risks. Hepatitis C blood test. Hepatitis B blood test. Sexually transmitted disease (STD) testing. Diabetes screening. This is done by checking your blood sugar (glucose) after you have not eaten for a while (fasting). You may have this done every 1-3 years. Abdominal aortic aneurysm (AAA) screening. You may need this if you are a current or former smoker. Osteoporosis. You may be screened starting at age 78 if  you are at high risk. Talk with your health care provider about your test results, treatment options, and if necessary, the need for more tests. Vaccines  Your health care provider may recommend certain vaccines, such as: Influenza vaccine. This is recommended every year. Tetanus, diphtheria,  and acellular pertussis (Tdap, Td) vaccine. You may need a Td booster every 10 years. Zoster vaccine. You may need this after age 48. Pneumococcal 13-valent conjugate (PCV13) vaccine. One dose is recommended after age 29. Pneumococcal polysaccharide (PPSV23) vaccine. One dose is recommended after age 74. Talk to your health care provider about which screenings and vaccines you need and how often you need them. This information is not intended to replace advice given to you by your health care provider. Make sure you discuss any questions you have with your health care provider. Document Released: 09/06/2015 Document Revised: 04/29/2016 Document Reviewed: 06/11/2015 Elsevier Interactive Patient Education  2017 [employer].  Fall Prevention in the Home Falls can cause injuries. They can happen to people of all ages. There are many things you can do to make your home safe and to help prevent falls. What can I do on the outside of my home? Regularly fix the edges of walkways and driveways and fix any cracks. Remove anything that might make you trip as you walk through a door, such as a raised step or threshold. Trim any bushes or trees on the path to your home. Use bright outdoor lighting. Clear any walking paths of anything that might make someone trip, such as rocks or tools. Regularly check to see if handrails are loose or broken. Make sure that both sides of any steps have handrails. Any raised decks and porches should have guardrails on the edges. Have any leaves, snow, or ice cleared regularly. Use sand or salt on walking paths during winter. Clean up any spills in your garage right away. This includes oil or grease spills. What can I do in the bathroom? Use night lights. Install grab bars by the toilet and in the tub and shower. Do not use towel bars as grab bars. Use non-skid mats or decals in the tub or shower. If you need to sit down in the shower, use a plastic, non-slip  stool. Keep the floor dry. Clean up any water that spills on the floor as soon as it happens. Remove soap buildup in the tub or shower regularly. Attach bath mats securely with double-sided non-slip rug tape. Do not have throw rugs and other things on the floor that can make you trip. What can I do in the bedroom? Use night lights. Make sure that you have a light by your bed that is easy to reach. Do not use any sheets or blankets that are too big for your bed. They should not hang down onto the floor. Have a firm chair that has side arms. You can use this for support while you get dressed. Do not have throw rugs and other things on the floor that can make you trip. What can I do in the kitchen? Clean up any spills right away. Avoid walking on wet floors. Keep items that you use a lot in easy-to-reach places. If you need to reach something above you, use a strong step stool that has a grab bar. Keep electrical cords out of the way. Do not use floor polish or wax that makes floors slippery. If you must use wax, use non-skid floor wax. Do not have throw rugs and other things on  the floor that can make you trip. What can I do with my stairs? Do not leave any items on the stairs. Make sure that there are handrails on both sides of the stairs and use them. Fix handrails that are broken or loose. Make sure that handrails are as long as the stairways. Check any carpeting to make sure that it is firmly attached to the stairs. Fix any carpet that is loose or worn. Avoid having throw rugs at the top or bottom of the stairs. If you do have throw rugs, attach them to the floor with carpet tape. Make sure that you have a light switch at the top of the stairs and the bottom of the stairs. If you do not have them, ask someone to add them for you. What else can I do to help prevent falls? Wear shoes that: Do not have high heels. Have rubber bottoms. Are comfortable and fit you well. Are closed at the  toe. Do not wear sandals. If you use a stepladder: Make sure that it is fully opened. Do not climb a closed stepladder. Make sure that both sides of the stepladder are locked into place. Ask someone to hold it for you, if possible. Clearly mark and make sure that you can see: Any grab bars or handrails. First and last steps. Where the edge of each step is. Use tools that help you move around (mobility aids) if they are needed. These include: Canes. Walkers. Scooters. Crutches. Turn on the lights when you go into a dark area. Replace any light bulbs as soon as they burn out. Set up your furniture so you have a clear path. Avoid moving your furniture around. If any of your floors are uneven, fix them. If there are any pets around you, be aware of where they are. Review your medicines with your doctor. Some medicines can make you feel dizzy. This can increase your chance of falling. Ask your doctor what other things that you can do to help prevent falls. This information is not intended to replace advice given to you by your health care provider. Make sure you discuss any questions you have with your health care provider. Document Released: 06/06/2009 Document Revised: 01/16/2016 Document Reviewed: 09/14/2014 Elsevier Interactive Patient Education  2017 [employer].

## 2023-04-09 NOTE — Progress Notes (Signed)
Subjective:   Carlos Conley is a 72 y.o. male who presents for Medicare Annual/Subsequent preventive examination.  Visit Complete: Virtual  I connected with  Carlos Conley on 04/09/23 by a audio enabled telemedicine application and verified that I am speaking with the correct person using two identifiers.  Patient Location: Home  Provider Location: Office/Clinic  I discussed the limitations of evaluation and management by telemedicine. The patient expressed understanding and agreed to proceed.  Vital Signs: Unable to obtain new vitals due to this being a telehealth visit.  Patient Medicare AWV questionnaire was completed by the patient on (not done); I have confirmed that all information answered by patient is correct and no changes since this date.  Review of Systems         Objective:    There were no vitals filed for this visit. There is no height or weight on file to calculate BMI.     06/18/2022    4:01 PM 05/23/2022   12:03 PM 06/12/2021    8:47 AM 02/19/2021   10:12 AM 06/11/2020    8:45 AM 06/06/2019    9:01 AM 03/18/2019    7:33 PM  Advanced Directives  Does Patient Have a Medical Advance Directive? No No No No No No No  Would patient like information on creating a medical advance directive? No - Patient declined No - Patient declined Yes (MAU/Ambulatory/Procedural Areas - Information given) No - Patient declined Yes (MAU/Ambulatory/Procedural Areas - Information given) Yes (MAU/Ambulatory/Procedural Areas - Information given) No - Patient declined    Current Medications (verified) Outpatient Encounter Medications as of 04/09/2023  Medication Sig   amLODipine-valsartan (EXFORGE) 5-160 MG tablet Take 1 tablet by mouth daily.   ezetimibe (ZETIA) 10 MG tablet Take 1 tablet (10 mg total) by mouth daily.   sildenafil (VIAGRA) 100 MG tablet Take 1 tablet (100 mg total) by mouth daily as needed for erectile dysfunction.   thiamine (VITAMIN B-1) 50 MG tablet Take  1 tablet (50 mg total) by mouth daily.   No facility-administered encounter medications on file as of 04/09/2023.    Allergies (verified) Lipitor [atorvastatin], Pravastatin, and Simvastatin   History: Past Medical History:  Diagnosis Date   Arthritis    Cancer (HCC)    Prostate   Hyperlipidemia    Hypertension    Past Surgical History:  Procedure Laterality Date   COLONOSCOPY WITH PROPOFOL N/A 07/28/2016   Procedure: COLONOSCOPY WITH PROPOFOL;  Surgeon: Midge Minium, MD;  Location: [facility] END[facility]COPY;  Service: Endoscopy;  Laterality: N/A;   PROSTATE SURGERY  2002   PROSTATE SURGERY  2002   TOTAL HIP ARTHROPLASTY Right 05/27/2017   Procedure: TOTAL HIP ARTHROPLASTY ANTERIOR APPROACH;  Surgeon: Kennedy Bucker, MD;  Location: [facility] ORS;  Service: Orthopedics;  Laterality: Right;   Family History  Problem Relation Age of Onset   Cancer Father    Leukemia Father    Hypertension Sister    Hypertension Brother    Hypertension Sister    Hypertension Sister    Social History   Socioeconomic History   Marital status: Married    Spouse name: Carlos Conley   Number of children: 3   Years of education: Not on file   Highest education level: Some college, no degree  Occupational History   Occupation: Retired    Comment: Biomedical scientist at a plant   Tobacco Use   Smoking status: Never   Smokeless tobacco: Never  Vaping Use   Vaping status: Never Used  Substance and Sexual Activity   Alcohol use: Yes    Alcohol/week: 2.0 - 3.0 standard drinks of alcohol    Types: 2 - 3 Cans of beer per week    Comment: daily   Drug use: No   Sexual activity: Yes    Partners: Female  Other Topics Concern   Not on file  Social History Narrative   Retired April of 2016      Does not eat meat except for occasional fish   Social Determinants of Health   Financial Resource Strain: Low Risk  (03/11/2023)   Overall Financial Resource Strain (CARDIA)    Difficulty of Paying Living Expenses: Not hard  at all  Food Insecurity: No Food Insecurity (03/11/2023)   Hunger Vital Sign    Worried About Running Out of Food in the Last Year: Never true    Ran Out of Food in the Last Year: Never true  Transportation Needs: No Transportation Needs (03/11/2023)   PRAPARE - Administrator, Civil Service (Medical): No    Lack of Transportation (Non-Medical): No  Physical Activity: Insufficiently Active (03/11/2023)   Exercise Vital Sign    Days of Exercise per Week: 3 days    Minutes of Exercise per Session: 30 min  Stress: No Stress Concern Present (03/11/2023)   [employer] of Occupational Health - Occupational Stress Questionnaire    Feeling of Stress : Not at all  Social Connections: Socially Integrated (03/11/2023)   Social Connection and Isolation Panel [NHANES]    Frequency of Communication with Friends and Family: More than three times a week    Frequency of Social Gatherings with Friends and Family: More than three times a week    Attends Religious Services: More than 4 times per year    Active Member of [employer] or Organizations: Yes    Attends Engineer, structural: More than 4 times per year    Marital Status: Married    Tobacco Counseling Counseling given: Not Answered   Clinical Intake:                        Activities of Daily Living    03/11/2023    7:56 AM 03/10/2023    7:37 AM  In your present state of health, do you have any difficulty performing the following activities:  Hearing? 0 0  Vision? 0 0  Difficulty concentrating or making decisions? 0 0  Walking or climbing stairs? 0 0  Dressing or bathing? 0 0  Doing errands, shopping? 0 0    Patient Care Team: Alba Cory, MD as PCP - General (Family Medicine) Carmina Miller, MD as Referring Physician (Radiation Oncology)  Indicate any recent Medical Services you may have received from other than [facility] providers in the past year (date may be approximate).     Assessment:    This is a routine wellness examination for Carlos Conley.  Hearing/Vision screen No results found.  Dietary issues and exercise activities discussed:     Goals Addressed   None    Depression Screen    03/11/2023    7:56 AM 03/10/2023    7:37 AM 09/09/2022    7:55 AM 06/18/2022    1:27 PM 03/09/2022    7:55 AM 08/20/2021    2:09 PM 07/24/2021    7:50 AM  PHQ 2/9 Scores  PHQ - 2 Score 0 0 0 0 0 0 0  PHQ- 9 Score 0 0 0  0  0 0    Fall Risk    03/11/2023    7:56 AM 03/10/2023    7:37 AM 09/09/2022    7:55 AM 06/18/2022    1:27 PM 03/09/2022    7:55 AM  Fall Risk   Falls in the past year? 0 0 1 0 0  Number falls in past yr: 0 0 0 0 0  Injury with Fall? 0 0 1 0 0  Risk for fall due to : No Fall Risks No Fall Risks No Fall Risks No Fall Risks No Fall Risks  Follow up Falls prevention discussed Falls prevention discussed Falls prevention discussed Falls evaluation completed Falls prevention discussed    MEDICARE RISK AT HOME:   TIMED UP AND GO:  Was the test performed?  No    Cognitive Function:        06/18/2022    1:28 PM 06/11/2020    8:48 AM 06/06/2019    9:04 AM  6CIT Screen  What Year? 0 points 0 points 0 points  What month? 0 points 0 points 0 points  What time? 0 points 0 points 0 points  Count back from 20 0 points 0 points 0 points  Months in reverse 0 points 0 points 0 points  Repeat phrase 0 points 4 points 4 points  Total Score 0 points 4 points 4 points    Immunizations Immunization History  Administered Date(s) Administered   Fluad Quad(high Dose 65+) 05/15/2019, 06/04/2020, 06/14/2022   Influenza, High Dose Seasonal PF 05/24/2018, 06/26/2021   Influenza,inj,Quad PF,6+ Mos 06/19/2015, 06/02/2016, 07/09/2017   Influenza-Unspecified 06/04/2013   Moderna Covid-19 Vaccine Bivalent Booster 42yrs & up 07/25/2021, 06/14/2022   Moderna Sars-Covid-2 Vaccination 10/18/2019, 11/15/2019, 07/16/2020, 01/14/2021   Pneumococcal Conjugate-13 05/24/2018   Pneumococcal  Polysaccharide-23 06/06/2019   Respiratory Syncytial Virus Vaccine,Recomb Aduvanted(Arexvy) 04/04/2023   Tdap 03/26/2016   Zoster Recombinant(Shingrix) 09/21/2021, 11/23/2021   Zoster, Live 03/26/2016    TDAP status: Up to date  Flu Vaccine status: Up to date  Pneumococcal vaccine status: Up to date  Covid-19 vaccine status: Completed vaccines  Qualifies for Shingles Vaccine? Yes   Zostavax completed Yes   Shingrix Completed?: Yes  Screening Tests Health Maintenance  Topic Date Due   COVID-19 Vaccine (7 - 2023-24 season) 08/09/2022   INFLUENZA VACCINE  03/25/2023   Medicare Annual Wellness (AWV)  06/19/2023   DTaP/Tdap/Td (2 - Td or Tdap) 03/26/2026   Colonoscopy  07/28/2026   Pneumonia Vaccine 37+ Years old  Completed   Hepatitis C Screening  Completed   Zoster Vaccines- Shingrix  Completed   HPV VACCINES  Aged Out    Health Maintenance  Health Maintenance Due  Topic Date Due   COVID-19 Vaccine (7 - 2023-24 season) 08/09/2022   INFLUENZA VACCINE  03/25/2023    Colorectal cancer screening: Type of screening: Colonoscopy. Completed yes. Repeat every 5-10 years  Lung Cancer Screening: (Low Dose CT Chest recommended if Age 60-80 years, 20 pack-year currently smoking OR have quit w/in 15years.) does not qualify.   Lung Cancer Screening Referral: no  Additional Screening:  Hepatitis C Screening: does not qualify; Completed yes  Vision Screening: Recommended annual ophthalmology exams for early detection of glaucoma and other disorders of the eye. Is the patient up to date with their annual eye exam?  Yes  Who is the provider or what is the name of the office in which the patient attends annual eye exams? [facility] If pt is not established with a provider, would they  like to be referred to a provider to establish care? No .   Dental Screening: Recommended annual dental exams for proper oral hygiene  Diabetic Foot Exam: n/a  Community Resource Referral / Chronic  Care Management: CRR required this visit?  No   CCM required this visit?  No     Plan:     I have personally reviewed and noted the following in the patient's chart:   Medical and social history Use of alcohol, tobacco or illicit drugs  Current medications and supplements including opioid prescriptions. Patient is not currently taking opioid prescriptions. Functional ability and status Nutritional status Physical activity Advanced directives List of other physicians Hospitalizations, surgeries, and ER visits in previous 12 months Vitals Screenings to include cognitive, depression, and falls Referrals and appointments  In addition, I have reviewed and discussed with patient certain preventive protocols, quality metrics, and best practice recommendations. A written personalized care plan for preventive services as well as general preventive health recommendations were provided to patient.     Sue Lush, LPN   6/96/2952   After Visit Summary:   Nurse Notes: The patient states is doing well and has no concerns or questions at this time.

## 2023-04-12 ENCOUNTER — Ambulatory Visit (INDEPENDENT_AMBULATORY_CARE_PROVIDER_SITE_OTHER): Payer: Medicare Other

## 2023-04-12 DIAGNOSIS — E538 Deficiency of other specified B group vitamins: Secondary | ICD-10-CM | POA: Diagnosis not present

## 2023-04-12 MED ORDER — CYANOCOBALAMIN 1000 MCG/ML IJ SOLN
1000.0000 ug | Freq: Once | INTRAMUSCULAR | Status: AC
  Administered 2023-04-12: 1000 ug via INTRAMUSCULAR

## 2023-05-13 ENCOUNTER — Ambulatory Visit (INDEPENDENT_AMBULATORY_CARE_PROVIDER_SITE_OTHER): Payer: Medicare Other

## 2023-05-13 DIAGNOSIS — E538 Deficiency of other specified B group vitamins: Secondary | ICD-10-CM

## 2023-05-13 MED ORDER — CYANOCOBALAMIN 1000 MCG/ML IJ SOLN
1000.0000 ug | Freq: Once | INTRAMUSCULAR | Status: AC
  Administered 2023-05-13: 1000 ug via INTRAMUSCULAR

## 2023-06-11 NOTE — Progress Notes (Unsigned)
Name: Carlos Conley   MRN: [id number]    DOB: 04/07/1951   Date:06/14/2023       Progress Note  Subjective  Chief Complaint  Follow Up  HPI  Hematuria: seen by Dr. Lonna Cobb Sep 2023, had CT for hematuria work up and it showed stable mild diffuse bladder wall thickening consistent with chronic cystitis. He states he had hematuria again 2 weeks ago after mowing his yard, he did not contact his Urologist, stopped within one hour    Atherosclerosis aorta/Hyperlipidemia: found on CT done 04/2022, he is unable to take statin, he states he has been taking Zetia daily, he will have labs done today   HTN:he has been compliant with medication , BP today is elevated - he denies chest pain or palpitation. He states he spent the weekend at [employer] homecoming and drank and ate salty fries. He too medications this morning. He has not been checking his bp at home lately. He has been walking and going to the gym.  History of syphilis: [facility] and states no longer sexually active , does not want to get levels checked   Alcoholisms: he continues to drink but only at home, about 3 ( down from 4 )  beers per day, currently only shooting pool at a senior center and cannot drink there. This past weekend he drank a lot more due to [employer] homecoming - 18 pack Saturday and 6 on Sunday   Seizure: as a child, explained alcohol decreases seizure threshold. No episodes   Fatty liver/hyperglycemia: reminded patient of healthy diet . We will recheck labs   Hyperglycemia: we will recheck A1C today . Denies polyphagia, polydipsia or polyuria . He has been physically active but has not changed his diet yet   Patient Active Problem List   Diagnosis Date Noted   Pescetarian 03/11/2023   Alcoholism (HCC) 03/09/2022   Pure hypercholesterolemia 03/09/2022   Statin intolerance 03/09/2022   Seizures (HCC) 03/09/2022   History of prostate cancer 03/09/2022   History of syphilis 03/09/2022   Hematuria  08/10/2019   Atherosclerosis of aorta (HCC) 08/10/2019   Fatty liver 08/10/2019   History of total right hip replacement 08/11/2017   Reflux esophagitis    Benign neoplasm of transverse colon    Hyperlipidemia 01/02/2016   Essential hypertension 01/02/2016   Hyperglycemia 01/02/2016   ED (erectile dysfunction) of organic origin 02/16/2013    Past Surgical History:  Procedure Laterality Date   COLONOSCOPY WITH PROPOFOL N/A 07/28/2016   Procedure: COLONOSCOPY WITH PROPOFOL;  Surgeon: Midge Minium, MD;  Location: [facility] END[facility]COPY;  Service: Endoscopy;  Laterality: N/A;   PROSTATE SURGERY  2002   PROSTATE SURGERY  2002   TOTAL HIP ARTHROPLASTY Right 05/27/2017   Procedure: TOTAL HIP ARTHROPLASTY ANTERIOR APPROACH;  Surgeon: Kennedy Bucker, MD;  Location: [facility] ORS;  Service: Orthopedics;  Laterality: Right;    Family History  Problem Relation Age of Onset   Cancer Father    Leukemia Father    Hypertension Sister    Hypertension Brother    Hypertension Sister    Hypertension Sister     Social History   Tobacco Use   Smoking status: Never   Smokeless tobacco: Never  Substance Use Topics   Alcohol use: Yes    Alcohol/week: 2.0 - 3.0 standard drinks of alcohol    Types: 2 - 3 Cans of beer per week    Comment: daily     Current Outpatient Medications:    amLODipine-valsartan (  EXFORGE) 5-160 MG tablet, Take 1 tablet by mouth daily., Disp: 90 tablet, Rfl: 1   ezetimibe (ZETIA) 10 MG tablet, Take 1 tablet (10 mg total) by mouth daily., Disp: 90 tablet, Rfl: 1   sildenafil (VIAGRA) 100 MG tablet, Take 1 tablet (100 mg total) by mouth daily as needed for erectile dysfunction., Disp: 30 tablet, Rfl: 0   thiamine (VITAMIN B-1) 50 MG tablet, Take 1 tablet (50 mg total) by mouth daily. (Patient not taking: Reported on 04/09/2023), Disp: 100 tablet, Rfl: 1  Allergies  Allergen Reactions   Lipitor [Atorvastatin] Rash   Pravastatin Itching, Rash and Other (See Comments)    Redness from feet  up to knees.   Simvastatin Itching and Rash    I personally reviewed active problem list, medication list, allergies, family history, social history, health maintenance with the patient/caregiver today.   ROS  Ten systems reviewed and is negative except as mentioned in HPI    Objective  Vitals:   06/14/23 0835  BP: (!) 162/86  Pulse: 96  Resp: 16  SpO2: 98%  Weight: 215 lb (97.5 kg)  Height: 5\' 7"  (1.702 m)    Body mass index is 33.67 kg/m.  Physical Exam  Constitutional: Patient appears well-developed and well-nourished. Obese  No distress.  HEENT: head atraumatic, normocephalic, pupils equal and reactive to light, neck supple Cardiovascular: Normal rate, regular rhythm and normal heart sounds.  No murmur heard. No BLE edema. Pulmonary/Chest: Effort normal and breath sounds normal. No respiratory distress. Abdominal: Soft.  There is no tenderness. Psychiatric: Patient has a normal mood and affect. behavior is normal. Judgment and thought content normal.   PHQ2/9:    06/14/2023    8:35 AM 04/09/2023    8:23 AM 03/11/2023    7:56 AM 03/10/2023    7:37 AM 09/09/2022    7:55 AM  Depression screen PHQ 2/9  Decreased Interest 0 0 0 0 0  Down, Depressed, Hopeless 0 0 0 0 0  PHQ - 2 Score 0 0 0 0 0  Altered sleeping 0  0 0 0  Tired, decreased energy 0  0 0 0  Change in appetite 0  0 0 0  Feeling bad or failure about yourself  0  0 0 0  Trouble concentrating 0  0 0 0  Moving slowly or fidgety/restless 0  0 0 0  Suicidal thoughts 0  0 0 0  PHQ-9 Score 0  0 0 0    phq 9 is negative   Fall Risk:    06/14/2023    8:35 AM 04/09/2023    8:19 AM 03/11/2023    7:56 AM 03/10/2023    7:37 AM 09/09/2022    7:55 AM  Fall Risk   Falls in the past year? 0 0 0 0 1  Number falls in past yr: 0 0 0 0 0  Injury with Fall? 0 0 0 0 1  Risk for fall due to : No Fall Risks No Fall Risks No Fall Risks No Fall Risks No Fall Risks  Follow up Falls prevention discussed Falls prevention  discussed;Education provided Falls prevention discussed Falls prevention discussed Falls prevention discussed      Functional Status Survey: Is the patient deaf or have difficulty hearing?: No Does the patient have difficulty seeing, even when wearing glasses/contacts?: No Does the patient have difficulty concentrating, remembering, or making decisions?: No Does the patient have difficulty walking or climbing stairs?: No Does the patient have difficulty dressing or  bathing?: No Does the patient have difficulty doing errands alone such as visiting a doctor's office or shopping?: No    Assessment & Plan  1. Seizures (HCC)  Doing well  2. Alcoholism (HCC)  - COMPLETE METABOLIC PANEL WITH GFR  3. Atherosclerosis of aorta (HCC)  - Lipid panel  4. Statin intolerance  On Zetia   5. Vitamin B12 deficiency  - CBC with Differential/Platelet - B12 and Folate Panel - cyanocobalamin (VITAMIN B12) injection 1,000 mcg  6. Fatty liver   7. Vitamin B1 deficiency  - Vitamin B1  8. Hyperglycemia  - Hemoglobin A1c  9. Essential hypertension  - amLODipine-valsartan (EXFORGE) 5-160 MG tablet; Take 1 tablet by mouth daily.  Dispense: 90 tablet; Refill: 1

## 2023-06-14 ENCOUNTER — Ambulatory Visit (INDEPENDENT_AMBULATORY_CARE_PROVIDER_SITE_OTHER): Payer: Medicare Other | Admitting: Family Medicine

## 2023-06-14 ENCOUNTER — Encounter: Payer: Self-pay | Admitting: Family Medicine

## 2023-06-14 VITALS — BP 170/90 | HR 96 | Resp 16 | Ht 67.0 in | Wt 215.0 lb

## 2023-06-14 DIAGNOSIS — R739 Hyperglycemia, unspecified: Secondary | ICD-10-CM | POA: Diagnosis not present

## 2023-06-14 DIAGNOSIS — F1020 Alcohol dependence, uncomplicated: Secondary | ICD-10-CM

## 2023-06-14 DIAGNOSIS — E519 Thiamine deficiency, unspecified: Secondary | ICD-10-CM

## 2023-06-14 DIAGNOSIS — E538 Deficiency of other specified B group vitamins: Secondary | ICD-10-CM | POA: Diagnosis not present

## 2023-06-14 DIAGNOSIS — R569 Unspecified convulsions: Secondary | ICD-10-CM | POA: Diagnosis not present

## 2023-06-14 DIAGNOSIS — I10 Essential (primary) hypertension: Secondary | ICD-10-CM | POA: Diagnosis not present

## 2023-06-14 DIAGNOSIS — Z789 Other specified health status: Secondary | ICD-10-CM

## 2023-06-14 DIAGNOSIS — I700 Atherosclerosis of aorta: Secondary | ICD-10-CM

## 2023-06-14 DIAGNOSIS — K760 Fatty (change of) liver, not elsewhere classified: Secondary | ICD-10-CM | POA: Diagnosis not present

## 2023-06-14 MED ORDER — AMLODIPINE BESYLATE-VALSARTAN 5-160 MG PO TABS: 1.0000 | ORAL_TABLET | Freq: Every day | ORAL | 1 refills | Status: DC

## 2023-06-14 MED ORDER — CYANOCOBALAMIN 1000 MCG/ML IJ SOLN
1000.0000 ug | Freq: Once | INTRAMUSCULAR | Status: AC
  Administered 2023-06-14: 1000 ug via INTRAMUSCULAR

## 2023-06-18 LAB — COMPLETE METABOLIC PANEL WITH GFR
AG Ratio: 1.3 (calc) (ref 1.0–2.5)
ALT: 19 U/L (ref 9–46)
AST: 37 U/L — ABNORMAL HIGH (ref 10–35)
Albumin: 4.1 g/dL (ref 3.6–5.1)
Alkaline phosphatase (APISO): 79 U/L (ref 35–144)
BUN/Creatinine Ratio: 4 (calc) — ABNORMAL LOW (ref 6–22)
BUN: 4 mg/dL — ABNORMAL LOW (ref 7–25)
CO2: 32 mmol/L (ref 20–32)
Calcium: 9.8 mg/dL (ref 8.6–10.3)
Chloride: 100 mmol/L (ref 98–110)
Creat: 0.94 mg/dL (ref 0.70–1.28)
Globulin: 3.1 g/dL (ref 1.9–3.7)
Glucose, Bld: 109 mg/dL — ABNORMAL HIGH (ref 65–99)
Potassium: 4.6 mmol/L (ref 3.5–5.3)
Sodium: 139 mmol/L (ref 135–146)
Total Bilirubin: 0.7 mg/dL (ref 0.2–1.2)
Total Protein: 7.2 g/dL (ref 6.1–8.1)
eGFR: 87 mL/min/{1.73_m2} (ref >=60)

## 2023-06-18 LAB — CBC WITH DIFFERENTIAL/PLATELET
Absolute Lymphocytes: 1122 {cells}/uL (ref 850–3900)
Absolute Monocytes: 438 {cells}/uL (ref 200–950)
Basophils Absolute: 48 {cells}/uL (ref 0–200)
Basophils Relative: 0.8 %
Eosinophils Absolute: 78 {cells}/uL (ref 15–500)
Eosinophils Relative: 1.3 %
HCT: 49.8 % (ref 38.5–50.0)
Hemoglobin: 16.6 g/dL (ref 13.2–17.1)
MCH: 32.7 pg (ref 27.0–33.0)
MCHC: 33.3 g/dL (ref 32.0–36.0)
MCV: 98.2 fL (ref 80.0–100.0)
MPV: 9.4 fL (ref 7.5–12.5)
Monocytes Relative: 7.3 %
Neutro Abs: 4314 {cells}/uL (ref 1500–7800)
Neutrophils Relative %: 71.9 %
Platelets: 209 10*3/uL (ref 140–400)
RBC: 5.07 10*6/uL (ref 4.20–5.80)
RDW: 13.1 % (ref 11.0–15.0)
Total Lymphocyte: 18.7 %
WBC: 6 10*3/uL (ref 3.8–10.8)

## 2023-06-18 LAB — HEMOGLOBIN A1C
Hgb A1c MFr Bld: 5.6 %{Hb} (ref <5.7)
Mean Plasma Glucose: 114 mg/dL
eAG (mmol/L): 6.3 mmol/L

## 2023-06-18 LAB — B12 AND FOLATE PANEL
Folate: 5.5 ng/mL
Vitamin B-12: 248 pg/mL (ref 200–1100)

## 2023-06-18 LAB — LIPID PANEL
Cholesterol: 237 mg/dL — ABNORMAL HIGH (ref <200)
HDL: 50 mg/dL (ref >=40)
LDL Cholesterol (Calc): 168 mg/dL — ABNORMAL HIGH
Non-HDL Cholesterol (Calc): 187 mg/dL — ABNORMAL HIGH (ref <130)
Total CHOL/HDL Ratio: 4.7 (calc) (ref <5.0)
Triglycerides: 82 mg/dL (ref <150)

## 2023-06-18 LAB — VITAMIN B1: Vitamin B1 (Thiamine): 7 nmol/L — ABNORMAL LOW (ref 8–30)

## 2023-06-21 ENCOUNTER — Ambulatory Visit: Payer: Medicare Other

## 2023-06-21 VITALS — BP 138/80

## 2023-06-21 DIAGNOSIS — Z0130 Encounter for examination of blood pressure without abnormal findings: Secondary | ICD-10-CM

## 2023-07-08 ENCOUNTER — Other Ambulatory Visit: Payer: Self-pay | Admitting: Family Medicine

## 2023-07-08 DIAGNOSIS — E7800 Pure hypercholesterolemia, unspecified: Secondary | ICD-10-CM

## 2023-07-08 DIAGNOSIS — I10 Essential (primary) hypertension: Secondary | ICD-10-CM

## 2023-07-12 DIAGNOSIS — H524 Presbyopia: Secondary | ICD-10-CM | POA: Diagnosis not present

## 2023-07-16 ENCOUNTER — Ambulatory Visit (INDEPENDENT_AMBULATORY_CARE_PROVIDER_SITE_OTHER): Payer: Medicare Other

## 2023-07-16 DIAGNOSIS — E538 Deficiency of other specified B group vitamins: Secondary | ICD-10-CM | POA: Diagnosis not present

## 2023-07-16 MED ORDER — CYANOCOBALAMIN 1000 MCG/ML IJ SOLN
1000.0000 ug | Freq: Once | INTRAMUSCULAR | Status: AC
  Administered 2023-07-16: 1000 ug via INTRAMUSCULAR

## 2023-09-15 ENCOUNTER — Telehealth: Payer: Medicare Other | Admitting: Family Medicine

## 2023-09-15 NOTE — Progress Notes (Unsigned)
Name: Carlos Conley   MRN: [id number]    DOB: [birth year]   Date:09/15/2023       Progress Note  Subjective  Chief Complaint  Chief Complaint  Patient presents with   Medical Management of Chronic Issues    3 months    I connected with  Chari Manning  on 09/15/23 at  8:00 AM EST by a video enabled telemedicine application and verified that I am speaking with the correct person using two identifiers.  I discussed the limitations of evaluation and management by telemedicine and the availability of in person appointments. The patient expressed understanding and agreed to proceed with the virtual visit  Staff also discussed with the patient that there may be a patient responsible charge related to this service. Patient Location: *** Provider Location: *** Additional Individuals present: ***  HPI   Hematuria: seen by Dr. Lonna Cobb Sep 2023, had CT for hematuria work up and it showed stable mild diffuse bladder wall thickening consistent with chronic cystitis. He states he had hematuria again 2 weeks ago after mowing his yard, he did not contact his Urologist, stopped within one hour    Atherosclerosis aorta/Hyperlipidemia: found on CT done 04/2022, he is unable to take statin, he states he has been taking Zetia daily, he will have labs done today    HTN:he has been compliant with medication , BP today is elevated - he denies chest pain or palpitation. He states he spent the weekend at [employer] homecoming and drank and ate salty fries. He too medications this morning. He has not been checking his bp at home lately. He has been walking and going to the gym.   History of syphilis: [facility] and states no longer sexually active , does not want to get levels checked    Alcoholisms: he continues to drink but only at home, about 3 ( down from 4 )  beers per day, currently only shooting pool at a senior center and cannot drink there. This past weekend he drank a lot more due to  [employer] home[employer] - 18 pack Saturday and 6 on Sunday    Seizure: as a child, explained alcohol decreases seizure threshold. No episodes    Fatty liver/hyperglycemia: reminded patient of healthy diet . We will recheck labs    Hyperglycemia: we will recheck A1C today . Denies polyphagia, polydipsia or polyuria . He has been physically active but has not changed his diet yet   Patient Active Problem List   Diagnosis Date Noted   Pescetarian 03/11/2023   Alcoholism (HCC) 03/09/2022   Pure hypercholesterolemia 03/09/2022   Statin intolerance 03/09/2022   Seizures (HCC) 03/09/2022   History of prostate cancer 03/09/2022   History of syphilis 03/09/2022   Hematuria 08/10/2019   Atherosclerosis of aorta (HCC) 08/10/2019   Fatty liver 08/10/2019   History of total right hip replacement 08/11/2017   Reflux esophagitis    Benign neoplasm of transverse colon    Hyperlipidemia 01/02/2016   Essential hypertension 01/02/2016   Hyperglycemia 01/02/2016   ED (erectile dysfunction) of organic origin 02/16/2013    Past Surgical History:  Procedure Laterality Date   COLONOSCOPY WITH PROPOFOL N/A 07/28/2016   Procedure: COLONOSCOPY WITH PROPOFOL;  Surgeon: Midge Minium, MD;  Location: [facility]COPY;  Service: Endoscopy;  Laterality: N/A;   PROSTATE SURGERY  2002   PROSTATE SURGERY  2002   TOTAL HIP ARTHROPLASTY Right 05/27/2017   Procedure: TOTAL HIP ARTHROPLASTY ANTERIOR APPROACH;  Surgeon: Rosita Kea,  Casimiro Needle, MD;  Location: [facility] ORS;  Service: Orthopedics;  Laterality: Right;    Family History  Problem Relation Age of Onset   Cancer Father    Leukemia Father    Hypertension Sister    Hypertension Brother    Hypertension Sister    Hypertension Sister     Social History   Socioeconomic History   Marital status: Married    Spouse name: Windell Moulding   Number of children: 3   Years of education: Not on file   Highest education level: Some college, no degree  Occupational History   Occupation: Retired     Comment: Biomedical scientist at a plant   Tobacco Use   Smoking status: Never   Smokeless tobacco: Never  Vaping Use   Vaping status: Never Used  Substance and Sexual Activity   Alcohol use: Yes    Alcohol/week: 2.0 - 3.0 standard drinks of alcohol    Types: 2 - 3 Cans of beer per week    Comment: daily   Drug use: No   Sexual activity: Yes    Partners: Female  Other Topics Concern   Not on file  Social History Narrative   Retired April of 2016      Does not eat meat except for occasional fish   Social Drivers of Corporate investment banker Strain: Low Risk  (04/09/2023)   Overall Financial Resource Strain (CARDIA)    Difficulty of Paying Living Expenses: Not hard at all  Food Insecurity: No Food Insecurity (04/09/2023)   Hunger Vital Sign    Worried About Running Out of Food in the Last Year: Never true    Ran Out of Food in the Last Year: Never true  Transportation Needs: No Transportation Needs (04/09/2023)   PRAPARE - Administrator, Civil Service (Medical): No    Lack of Transportation (Non-Medical): No  Physical Activity: Sufficiently Active (04/09/2023)   Exercise Vital Sign    Days of Exercise per Week: 5 days    Minutes of Exercise per Session: 30 min  Recent Concern: Physical Activity - Insufficiently Active (03/11/2023)   Exercise Vital Sign    Days of Exercise per Week: 3 days    Minutes of Exercise per Session: 30 min  Stress: No Stress Concern Present (04/09/2023)   [employer] of Occupational Health - Occupational Stress Questionnaire    Feeling of Stress : Not at all  Social Connections: Socially Integrated (04/09/2023)   Social Connection and Isolation Panel [NHANES]    Frequency of Communication with Friends and Family: More than three times a week    Frequency of Social Gatherings with Friends and Family: More than three times a week    Attends Religious Services: More than 4 times per year    Active Member of [employer] or Organizations:  Yes    Attends Banker Meetings: More than 4 times per year    Marital Status: Married  Catering manager Violence: Not At Risk (04/09/2023)   Humiliation, Afraid, Rape, and Kick questionnaire    Fear of Current or Ex-Partner: No    Emotionally Abused: No    Physically Abused: No    Sexually Abused: No     Current Outpatient Medications:    amLODipine-valsartan (EXFORGE) 5-160 MG tablet, Take 1 tablet by mouth daily., Disp: 90 tablet, Rfl: 1   ezetimibe (ZETIA) 10 MG tablet, TAKE 1 TABLET BY MOUTH DAILY, Disp: 100 tablet, Rfl: 0   thiamine (VITAMIN B-1) 50 MG  tablet, Take 1 tablet (50 mg total) by mouth daily., Disp: 100 tablet, Rfl: 1  Allergies  Allergen Reactions   Lipitor [Atorvastatin] Rash   Pravastatin Itching, Rash and Other (See Comments)    Redness from feet up to knees.   Simvastatin Itching and Rash    I personally reviewed active problem list, medication list, allergies, family history with the patient/caregiver today.   ROS  Ten systems reviewed and is negative except as mentioned in HPI   Objective  Virtual encounter, vitals not obtained.  There is no height or weight on file to calculate BMI.  Physical Exam  Awake, alert and oriented   PHQ2/9:    06/14/2023    8:35 AM 04/09/2023    8:23 AM 03/11/2023    7:56 AM 03/10/2023    7:37 AM 09/09/2022    7:55 AM  Depression screen PHQ 2/9  Decreased Interest 0 0 0 0 0  Down, Depressed, Hopeless 0 0 0 0 0  PHQ - 2 Score 0 0 0 0 0  Altered sleeping 0  0 0 0  Tired, decreased energy 0  0 0 0  Change in appetite 0  0 0 0  Feeling bad or failure about yourself  0  0 0 0  Trouble concentrating 0  0 0 0  Moving slowly or fidgety/restless 0  0 0 0  Suicidal thoughts 0  0 0 0  PHQ-9 Score 0  0 0 0   PHQ-2/9 Result is negative.    Fall Risk:    06/14/2023    8:35 AM 04/09/2023    8:19 AM 03/11/2023    7:56 AM 03/10/2023    7:37 AM 09/09/2022    7:55 AM  Fall Risk   Falls in the past year? 0 0  0 0 1  Number falls in past yr: 0 0 0 0 0  Injury with Fall? 0 0 0 0 1  Risk for fall due to : No Fall Risks No Fall Risks No Fall Risks No Fall Risks No Fall Risks  Follow up Falls prevention discussed Falls prevention discussed;Education provided Falls prevention discussed Falls prevention discussed Falls prevention discussed     {A&P:32071}  There are no diagnoses linked to this encounter.  I discussed the assessment and treatment plan with the patient. The patient was provided an opportunity to ask questions and all were answered. The patient agreed with the plan and demonstrated an understanding of the instructions.  The patient was advised to call back or seek an in-person evaluation if the symptoms worsen or if the condition fails to improve as anticipated.  I provided *** minutes of non-face-to-face time during this encounter.

## 2023-09-16 ENCOUNTER — Encounter: Payer: Self-pay | Admitting: Family Medicine

## 2023-09-16 ENCOUNTER — Ambulatory Visit (INDEPENDENT_AMBULATORY_CARE_PROVIDER_SITE_OTHER): Payer: Medicare Other | Admitting: Family Medicine

## 2023-09-16 VITALS — BP 124/78 | HR 92 | Temp 98.0°F | Resp 16 | Ht 67.0 in | Wt 216.3 lb

## 2023-09-16 DIAGNOSIS — R569 Unspecified convulsions: Secondary | ICD-10-CM | POA: Diagnosis not present

## 2023-09-16 DIAGNOSIS — Z789 Other specified health status: Secondary | ICD-10-CM

## 2023-09-16 DIAGNOSIS — E7800 Pure hypercholesterolemia, unspecified: Secondary | ICD-10-CM

## 2023-09-16 DIAGNOSIS — E519 Thiamine deficiency, unspecified: Secondary | ICD-10-CM | POA: Diagnosis not present

## 2023-09-16 DIAGNOSIS — E538 Deficiency of other specified B group vitamins: Secondary | ICD-10-CM

## 2023-09-16 DIAGNOSIS — F1020 Alcohol dependence, uncomplicated: Secondary | ICD-10-CM

## 2023-09-16 DIAGNOSIS — I10 Essential (primary) hypertension: Secondary | ICD-10-CM

## 2023-09-16 DIAGNOSIS — I700 Atherosclerosis of aorta: Secondary | ICD-10-CM

## 2023-09-16 MED ORDER — EZETIMIBE 10 MG PO TABS: 10.0000 mg | ORAL_TABLET | Freq: Every day | ORAL | 0 refills | Status: DC

## 2023-09-16 MED ORDER — CYANOCOBALAMIN 1000 MCG/ML IJ SOLN
1000.0000 ug | Freq: Once | INTRAMUSCULAR | Status: AC
  Administered 2023-09-16: 1000 ug via INTRAMUSCULAR

## 2023-09-16 MED ORDER — AMLODIPINE BESYLATE-VALSARTAN 5-160 MG PO TABS: 1.5000 | ORAL_TABLET | Freq: Every day | ORAL | 0 refills | Status: DC

## 2023-09-16 NOTE — Progress Notes (Signed)
Name: Carlos Conley   MRN: [id number]    DOB: [birth year]   Date:09/16/2023       Progress Note  Subjective  Chief Complaint  Chief Complaint  Patient presents with   Medical Management of Chronic Issues   Discussed the use of AI scribe software for clinical note transcription with the patient, who gave verbal consent to proceed.  History of Present Illness   The patient, with a history of hypertension, hyperlipidemia, and B1 and B12 deficiencies, presents with concerns about elevated blood pressure readings at home. He reports consistent readings in the 140s and occasionally higher. He has been monitoring his blood pressure in the mornings, around 7:30 AM. He is currently on amlodipine valsartan 5/160 mg , ezetimibe for hypercholesterolemia  and atherosclerosis aortal, and B1 supplements. He also receives B12 injections due to a previous deficiency.  The patient has been making lifestyle changes, including increased physical activity and dietary modifications. He reports walking on a treadmill, lifting weights, and doing other physical tasks daily. He has also reduced his salt intake and primarily consumes fish, avoiding red meat.  He has a history of alcoholism and currently consumes three to four beers daily, sometimes up to five. He denies any recent seizures, a problem he had in childhood. He also reports a history of hematuria, which has not recurred since his last visit in October He has been evaluated by Dr. Lonna Cobb int he past. He has been managing this with increased fluid intake.   The patient also has a history of fatty liver, increased abdominal girth, and high sugar levels, indicative of metabolic syndrome. He denies symptoms of diabetes such as excessive hunger or thirst. He has a history of statin myopathy and is currently on ezetimibe for cholesterol management. He denies any chest pain or palpitations.       Patient Active Problem List   Diagnosis Date Noted   Pescetarian  03/11/2023   Alcoholism (HCC) 03/09/2022   Pure hypercholesterolemia 03/09/2022   Statin intolerance 03/09/2022   Seizures (HCC) 03/09/2022   History of prostate cancer 03/09/2022   History of syphilis 03/09/2022   Hematuria 08/10/2019   Atherosclerosis of aorta (HCC) 08/10/2019   Fatty liver 08/10/2019   History of total right hip replacement 08/11/2017   Reflux esophagitis    Benign neoplasm of transverse colon    Hyperlipidemia 01/02/2016   Essential hypertension 01/02/2016   Hyperglycemia 01/02/2016   ED (erectile dysfunction) of organic origin 02/16/2013    Past Surgical History:  Procedure Laterality Date   COLONOSCOPY WITH PROPOFOL N/A 07/28/2016   Procedure: COLONOSCOPY WITH PROPOFOL;  Surgeon: Midge Minium, MD;  Location: [facility];  Service: Endoscopy;  Laterality: N/A;   PROSTATE SURGERY  2002   PROSTATE SURGERY  2002   TOTAL HIP ARTHROPLASTY Right 05/27/2017   Procedure: TOTAL HIP ARTHROPLASTY ANTERIOR APPROACH;  Surgeon: Kennedy Bucker, MD;  Location: [facility] ORS;  Service: Orthopedics;  Laterality: Right;    Family History  Problem Relation Age of Onset   Cancer Father    Leukemia Father    Hypertension Sister    Hypertension Brother    Hypertension Sister    Hypertension Sister     Social History   Tobacco Use   Smoking status: Never   Smokeless tobacco: Never  Substance Use Topics   Alcohol use: Yes    Alcohol/week: 2.0 - 3.0 standard drinks of alcohol    Types: 2 - 3 Cans of beer per week    Comment:  daily     Current Outpatient Medications:    thiamine (VITAMIN B-1) 50 MG tablet, Take 1 tablet (50 mg total) by mouth daily., Disp: 100 tablet, Rfl: 1   amLODipine-valsartan (EXFORGE) 5-160 MG tablet, Take 1.5 tablets by mouth daily., Disp: 1 tablet, Rfl: 0   ezetimibe (ZETIA) 10 MG tablet, Take 1 tablet (10 mg total) by mouth daily., Disp: 100 tablet, Rfl: 0  Allergies  Allergen Reactions   Lipitor [Atorvastatin] Rash   Pravastatin Itching,  Rash and Other (See Comments)    Redness from feet up to knees.   Simvastatin Itching and Rash    I personally reviewed active problem list, medication list, allergies, family history with the patient/caregiver today.   ROS  Ten systems reviewed and is negative except as mentioned in HPI    Objective  Vitals:   09/16/23 0824 09/16/23 0851  BP: (!) 162/88 124/78  Pulse: 92   Resp: 16   Temp: 98 F (36.7 C)   TempSrc: Oral   SpO2: 99%   Weight: 216 lb 4.8 oz (98.1 kg)   Height: 5\' 7"  (1.702 m)     Body mass index is 33.88 kg/m.  Physical Exam  Constitutional: Patient appears well-developed and well-nourished. Obese = No distress.  HEENT: head atraumatic, normocephalic, pupils equal and reactive to light, , neck supple Cardiovascular: Normal rate, regular rhythm and normal heart sounds.  No murmur heard. No BLE edema. Pulmonary/Chest: Effort normal and breath sounds normal. No respiratory distress. Abdominal: Soft.  There is no tenderness. Psychiatric: Patient has a normal mood and affect. behavior is normal. Judgment and thought content normal.   Diabetic Foot Exam:     PHQ2/9:    09/16/2023    8:22 AM 06/14/2023    8:35 AM 04/09/2023    8:23 AM 03/11/2023    7:56 AM 03/10/2023    7:37 AM  Depression screen PHQ 2/9  Decreased Interest 0 0 0 0 0  Down, Depressed, Hopeless 0 0 0 0 0  PHQ - 2 Score 0 0 0 0 0  Altered sleeping 0 0  0 0  Tired, decreased energy 0 0  0 0  Change in appetite 0 0  0 0  Feeling bad or failure about yourself  0 0  0 0  Trouble concentrating 0 0  0 0  Moving slowly or fidgety/restless 0 0  0 0  Suicidal thoughts 0 0  0 0  PHQ-9 Score 0 0  0 0  Difficult doing work/chores Not difficult at all        phq 9 is negative  Fall Risk:    09/16/2023    8:21 AM 06/14/2023    8:35 AM 04/09/2023    8:19 AM 03/11/2023    7:56 AM 03/10/2023    7:37 AM  Fall Risk   Falls in the past year? 0 0 0 0 0  Number falls in past yr: 0 0 0 0 0   Injury with Fall? 0 0 0 0 0  Risk for fall due to : No Fall Risks No Fall Risks No Fall Risks No Fall Risks No Fall Risks  Follow up Falls prevention discussed;Education provided;Falls evaluation completed Falls prevention discussed Falls prevention discussed;Education provided Falls prevention discussed Falls prevention discussed     Assessment and Plan    Hypertension Elevated blood pressure readings at home (140s-150s) and in clinic (160s-170s). Currently on Amlodipine/Valsartan 5/160mg  daily. -Increase Amlodipine/Valsartan to 1.5 tablets daily for one week. -Check  blood pressure in clinic in one week. If still elevated, consider increasing to 2 tablets daily.  Hyperlipidemia High LDL cholesterol (168) on Ezetimibe. Patient admits to inconsistent medication use in the past, but is now compliant. -Continue Ezetimibe. -Check cholesterol levels at next visit to assess efficacy of Ezetimibe.  B1 and B12 Deficiency Currently on B1 supplement and receiving monthly B12 injections. -Continue current regimen. -B12 injection today   Metabolic Syndrome Patient has fatty liver, increased abdominal girth, and high sugars. Encouraging physical activity to manage condition. -Continue current lifestyle modifications including exercise and low salt diet.  Alcohol Use Consumes 3-4 beers daily, sometimes up to 5. -Continue to monitor for potential health risks associated with alcohol consumption.  Hematuria No recent episodes since workup with urologist. -If bleeding recurs, refer back to urologist.  Atherosclerosis of the Aorta Found on CT in September 2023. Currently managed with Ezetimibe. -Continue Ezetimibe.  Statin Myopathy Unable to tolerate statins. Currently managed with Ezetimibe. -Continue Ezetimibe. Consider alternative lipid-lowering therapy if cholesterol remains uncontrolled.

## 2023-09-23 ENCOUNTER — Other Ambulatory Visit: Payer: Self-pay | Admitting: Family Medicine

## 2023-09-23 NOTE — Telephone Encounter (Signed)
He already has a bp check as a nurse visit on 09-30-2023 next week.

## 2023-09-26 ENCOUNTER — Other Ambulatory Visit: Payer: Self-pay | Admitting: Family Medicine

## 2023-09-26 DIAGNOSIS — I700 Atherosclerosis of aorta: Secondary | ICD-10-CM

## 2023-09-26 DIAGNOSIS — E7800 Pure hypercholesterolemia, unspecified: Secondary | ICD-10-CM

## 2023-09-30 ENCOUNTER — Other Ambulatory Visit: Payer: Self-pay | Admitting: Family Medicine

## 2023-09-30 ENCOUNTER — Ambulatory Visit: Payer: Medicare Other

## 2023-09-30 DIAGNOSIS — I10 Essential (primary) hypertension: Secondary | ICD-10-CM

## 2023-09-30 NOTE — Progress Notes (Signed)
 Patient is in office today for a nurse visit for Blood Pressure Check. Patient blood pressure was 136/82, Patient No chest pain, No shortness of breath, No dyspnea on exertion, No orthopnea, No paroxysmal nocturnal dyspnea, No edema, No palpitations, No syncope.   Hypertension Elevated blood pressure readings at home (140s-150s) and in clinic (160s-170s). Currently on Amlodipine /Valsartan  5/160mg  daily. -Increase Amlodipine /Valsartan  to 1.5 tablets daily for one week. -Check blood pressure in clinic in one week. If still elevated, consider increasing to 2 tablets daily.

## 2023-09-30 NOTE — Telephone Encounter (Signed)
 Copied from CRM [id number]. Topic: General - Other >> Sep 30, 2023  4:28 PM Bearl Botts E wrote: Reason for CRM: Pt returned call, wants to speak to clinic about medication questions.

## 2023-10-10 ENCOUNTER — Other Ambulatory Visit: Payer: Self-pay | Admitting: Family Medicine

## 2023-10-10 DIAGNOSIS — E7800 Pure hypercholesterolemia, unspecified: Secondary | ICD-10-CM

## 2023-10-10 DIAGNOSIS — I700 Atherosclerosis of aorta: Secondary | ICD-10-CM

## 2023-12-01 ENCOUNTER — Ambulatory Visit: Payer: Self-pay

## 2023-12-01 NOTE — Telephone Encounter (Signed)
 Copied from CRM [id number]. Topic: Clinical - Red Word Triage >> Dec 01, 2023 10:00 AM Emylou G wrote: [facility] that prompted transfer to Nurse Triage: blood in urine  Chief Complaint: urine-blood Symptoms: bloody urine, right side pain Frequency: yesterday Pertinent Negatives: Patient denies fever or painful urination Disposition: [] ED /[] Urgent Care (no appt availability in office) / [x] Appointment(In office/virtual)/ []  [facility]/ [] Home Care/ [] Refused Recommended Disposition /[]  Mobile Bus/ []  Follow-up with PCP Additional Notes: pt started bleeding a couple weeks ago & then stopped.  Yesterday pt outside doing yard work and bloody urine started again: pt c/o a lot of dark blood with blood clots and 7/10 right side pain yesterday. Today pt reports blood in urine has decreased, is a lighter red with no blood clots & 2/10 right side pain. Scheduled pt in office visit with different provider w/n PCP office for evaluation and treatment.  Reason for Disposition  Side (flank) or back pain present  Answer Assessment - Initial Assessment Questions 1. COLOR of URINE: "Describe the color of the urine."  (e.g., tea-colored, pink, red, bloody) "Do you have blood clots in your urine?" (e.g., none, pea, grape, small coin)     Dark to light red 2. ONSET: "When did the bleeding start?"      Started yesterday 3. EPISODES: "How many times has there been blood in the urine?" or "How many times today?"     Every time urinate 4. PAIN with URINATION: "Is there any pain with passing your urine?" If Yes, ask: "How bad is the pain?"  (Scale 1-10; or mild, moderate, severe)    - MILD: Complains slightly about urination hurting.    - MODERATE: Interferes with normal activities.      - SEVERE: Excruciating, unwilling or unable to urinate because of the pain.      no 5. FEVER: "Do you have a fever?" If Yes, ask: "What is your temperature, how was it measured, and when did it start?"      no 6. ASSOCIATED SYMPTOMS: "Are you passing urine more frequently than usual?"     no 7. OTHER SYMPTOMS: "Do you have any other symptoms?" (e.g., back/flank pain, abdomen pain, vomiting)     Right side 7/10 yesterday and today 2/10 8. PREGNANCY: "Is there any chance you are pregnant?" "When was your last menstrual period?"     N/a  Protocols used: Urine - Blood In-A-AH

## 2023-12-02 ENCOUNTER — Ambulatory Visit (INDEPENDENT_AMBULATORY_CARE_PROVIDER_SITE_OTHER): Admitting: Internal Medicine

## 2023-12-02 ENCOUNTER — Other Ambulatory Visit: Payer: Self-pay

## 2023-12-02 ENCOUNTER — Encounter: Payer: Self-pay | Admitting: Internal Medicine

## 2023-12-02 VITALS — BP 130/72 | HR 88 | Temp 98.1°F | Resp 16 | Ht 67.0 in | Wt 213.7 lb

## 2023-12-02 DIAGNOSIS — R310 Gross hematuria: Secondary | ICD-10-CM | POA: Diagnosis not present

## 2023-12-02 DIAGNOSIS — R109 Unspecified abdominal pain: Secondary | ICD-10-CM

## 2023-12-02 LAB — POCT URINALYSIS DIPSTICK
Bilirubin, UA: NEGATIVE
Glucose, UA: NEGATIVE
Ketones, UA: NEGATIVE
Nitrite, UA: NEGATIVE
Protein, UA: POSITIVE — AB
Spec Grav, UA: 1.015 (ref 1.010–1.025)
Urobilinogen, UA: 0.2 U/dL
pH, UA: 6 (ref 5.0–8.0)

## 2023-12-02 NOTE — Progress Notes (Signed)
 Acute Office Visit  Subjective:     Patient ID: Carlos Conley, male    DOB: [birth year], 73 y.o.   MRN: [id number]  Chief Complaint  Patient presents with   Hematuria    Low back pain, seen blood and clots in urine    Hematuria Irritative symptoms do not include frequency or urgency. Associated symptoms include flank pain. Pertinent negatives include no abdominal pain, chills, dysuria or fever.   Patient is in today for episode of gross hematuria.   Discussed the use of AI scribe software for clinical note transcription with the patient, who gave verbal consent to proceed.  History of Present Illness The patient, with a history of prostate cancer, presents with hematuria characterized by the passage of a large amount of blood and blood clots in his urine. This episode occurred after the patient was involved in strenuous yard work, which is more physical activity than he is accustomed to. The patient reports that the urine was dark in color and he had not been drinking a lot of water on the day of the episode and had been drinking beer. He also reports experiencing pain in his right flank on the day of the episode. The patient has had similar, but less severe, episodes in the past and has been examined by a urologist, but no cause was found. The patient also reports post-void dribbling but denies any pain during urination. Denies abdominal pain, back pain. No fevers or recent illness. Urine is still dark in color but no more visible blood or clots.    Review of Systems  Constitutional:  Negative for chills and fever.  Gastrointestinal:  Negative for abdominal pain.  Genitourinary:  Positive for flank pain and hematuria. Negative for dysuria, frequency and urgency.        Objective:    BP 130/72 (Cuff Size: Large)   Pulse 88   Temp 98.1 F (36.7 C) (Oral)   Resp 16   Ht 5\' 7"  (1.702 m)   Wt 213 lb 11.2 oz (96.9 kg)   SpO2 98%   BMI 33.47 kg/m    Physical  Exam Constitutional:      Appearance: Normal appearance.  HENT:     Head: Normocephalic and atraumatic.  Eyes:     Conjunctiva/sclera: Conjunctivae normal.  Cardiovascular:     Rate and Rhythm: Normal rate and regular rhythm.  Pulmonary:     Effort: Pulmonary effort is normal.     Breath sounds: Normal breath sounds.  Abdominal:     General: There is no distension.     Palpations: Abdomen is soft.     Tenderness: There is no abdominal tenderness. There is no right CVA tenderness, left CVA tenderness or guarding.  Musculoskeletal:     Right lower leg: No edema.     Left lower leg: No edema.  Skin:    General: Skin is warm and dry.  Neurological:     General: No focal deficit present.     Mental Status: He is alert. Mental status is at baseline.  Psychiatric:        Mood and Affect: Mood normal.        Behavior: Behavior normal.     Results for orders placed or performed in visit on 12/02/23  POCT urinalysis dipstick  Result Value Ref Range   Color, UA gold    Clarity, UA cloudy    Glucose, UA Negative Negative   Bilirubin, UA negative    Ketones, UA  negative    Spec Grav, UA 1.015 1.010 - 1.025   Blood, UA large    pH, UA 6.0 5.0 - 8.0   Protein, UA Positive (A) Negative   Urobilinogen, UA 0.2 0.2 or 1.0 E.U./dL   Nitrite, UA negative    Leukocytes, UA Large (3+) (A) Negative   Appearance cloudy    Odor strong         Assessment & Plan:   Assessment & Plan Hematuria Acute gross hematuria with clots post-exertion, intermittent right flank pain. Differential includes nephrolithiasis, UTI, exertional rhabdomyolysis. Urinalysis shows blood, protein, leukocytes. No fever or systemic symptoms. History of prostate cancer, normal PSA July 2024. Routine CT preferred. - Order routine CT scan of urinary tract - Perform CBC, renal function tests, creatine kinase. - Send urine for microscopic analysis. - Advise hydration, avoid alcohol. - Instruct to avoid strenuous  activities. - Recommend Tylenol or heating pad for pain. - Instruct to seek emergency care if pain worsens.  Hx of Prostate Cancer Prostate cancer treated with prostatectomy in 2002. Normal PSA July 2024. No symptoms of recurrence. - Continue annual PSA monitoring.  - POCT urinalysis dipstick - Urinalysis, Routine w reflex microscopic - Basic Metabolic Panel (BMET) - CK; Future - CT HEMATURIA WORKUP; Future - CBC w/Diff/Platelet  Return for already scheduled.  Margarita Mail, DO

## 2023-12-03 LAB — BASIC METABOLIC PANEL WITH GFR
BUN/Creatinine Ratio: 5 (calc) — ABNORMAL LOW (ref 6–22)
BUN: 5 mg/dL — ABNORMAL LOW (ref 7–25)
CO2: 30 mmol/L (ref 20–32)
Calcium: 9.7 mg/dL (ref 8.6–10.3)
Chloride: 99 mmol/L (ref 98–110)
Creat: 1 mg/dL (ref 0.70–1.28)
Glucose, Bld: 101 mg/dL — ABNORMAL HIGH (ref 65–99)
Potassium: 4.8 mmol/L (ref 3.5–5.3)
Sodium: 136 mmol/L (ref 135–146)
eGFR: 81 mL/min/{1.73_m2} (ref >=60)

## 2023-12-03 LAB — URINALYSIS, ROUTINE W REFLEX MICROSCOPIC
Bilirubin Urine: NEGATIVE
Glucose, UA: NEGATIVE
Hgb urine dipstick: NEGATIVE
Ketones, ur: NEGATIVE
Leukocytes,Ua: NEGATIVE
Nitrite: NEGATIVE
Protein, ur: NEGATIVE
Specific Gravity, Urine: 1.012 (ref 1.001–1.035)
pH: 6 (ref 5.0–8.0)

## 2023-12-03 LAB — CBC WITH DIFFERENTIAL/PLATELET
Absolute Lymphocytes: 1111 {cells}/uL (ref 850–3900)
Absolute Monocytes: 462 {cells}/uL (ref 200–950)
Basophils Absolute: 48 {cells}/uL (ref 0–200)
Basophils Relative: 0.7 %
Eosinophils Absolute: 117 {cells}/uL (ref 15–500)
Eosinophils Relative: 1.7 %
HCT: 47.6 % (ref 38.5–50.0)
Hemoglobin: 16.2 g/dL (ref 13.2–17.1)
MCH: 32.4 pg (ref 27.0–33.0)
MCHC: 34 g/dL (ref 32.0–36.0)
MCV: 95.2 fL (ref 80.0–100.0)
MPV: 9.2 fL (ref 7.5–12.5)
Monocytes Relative: 6.7 %
Neutro Abs: 5161 {cells}/uL (ref 1500–7800)
Neutrophils Relative %: 74.8 %
Platelets: 217 10*3/uL (ref 140–400)
RBC: 5 10*6/uL (ref 4.20–5.80)
RDW: 13.9 % (ref 11.0–15.0)
Total Lymphocyte: 16.1 %
WBC: 6.9 10*3/uL (ref 3.8–10.8)

## 2023-12-13 ENCOUNTER — Ambulatory Visit
Admission: RE | Admit: 2023-12-13 | Discharge: 2023-12-13 | Disposition: A | Discharge status: Home / Self Care | Source: Ambulatory Visit | Attending: Internal Medicine | Admitting: Internal Medicine

## 2023-12-13 DIAGNOSIS — R10A1 Flank pain, right side: Secondary | ICD-10-CM

## 2023-12-13 DIAGNOSIS — R109 Unspecified abdominal pain: Secondary | ICD-10-CM | POA: Diagnosis present

## 2023-12-13 DIAGNOSIS — R310 Gross hematuria: Secondary | ICD-10-CM

## 2023-12-13 MED ORDER — IOHEXOL 300 MG/ML  SOLN
125.0000 mL | Freq: Once | INTRAMUSCULAR | Status: AC | PRN
  Administered 2023-12-13: 125 mL via INTRAVENOUS

## 2023-12-13 MED ORDER — SODIUM CHLORIDE 0.9 % IV SOLN: INTRAVENOUS | Status: DC

## 2023-12-16 ENCOUNTER — Ambulatory Visit: Payer: Self-pay | Admitting: Family Medicine

## 2023-12-30 ENCOUNTER — Ambulatory Visit (INDEPENDENT_AMBULATORY_CARE_PROVIDER_SITE_OTHER): Admitting: Family Medicine

## 2023-12-30 ENCOUNTER — Encounter: Payer: Self-pay | Admitting: Family Medicine

## 2023-12-30 VITALS — BP 122/74 | HR 83 | Resp 16 | Ht 67.0 in | Wt 214.0 lb

## 2023-12-30 DIAGNOSIS — E538 Deficiency of other specified B group vitamins: Secondary | ICD-10-CM | POA: Insufficient documentation

## 2023-12-30 DIAGNOSIS — I10 Essential (primary) hypertension: Secondary | ICD-10-CM

## 2023-12-30 DIAGNOSIS — I700 Atherosclerosis of aorta: Secondary | ICD-10-CM | POA: Diagnosis not present

## 2023-12-30 DIAGNOSIS — R160 Hepatomegaly, not elsewhere classified: Secondary | ICD-10-CM

## 2023-12-30 DIAGNOSIS — F1020 Alcohol dependence, uncomplicated: Secondary | ICD-10-CM

## 2023-12-30 DIAGNOSIS — R310 Gross hematuria: Secondary | ICD-10-CM | POA: Diagnosis not present

## 2023-12-30 DIAGNOSIS — E519 Thiamine deficiency, unspecified: Secondary | ICD-10-CM | POA: Diagnosis not present

## 2023-12-30 DIAGNOSIS — Z789 Other specified health status: Secondary | ICD-10-CM

## 2023-12-30 DIAGNOSIS — R569 Unspecified convulsions: Secondary | ICD-10-CM | POA: Diagnosis not present

## 2023-12-30 MED ORDER — AMLODIPINE BESYLATE-VALSARTAN 5-160 MG PO TABS: 1.0000 | ORAL_TABLET | Freq: Every day | ORAL | 0 refills | Status: DC

## 2023-12-30 MED ORDER — NEXLIZET 180-10 MG PO TABS
1.0000 | ORAL_TABLET | Freq: Every day | ORAL | 1 refills | Status: DC
Start: 2023-12-30 — End: 2024-02-10

## 2023-12-30 MED ORDER — CYANOCOBALAMIN 1000 MCG/ML IJ SOLN
1000.0000 ug | Freq: Once | INTRAMUSCULAR | Status: AC
  Administered 2023-12-30: 1000 ug via INTRAMUSCULAR

## 2023-12-30 NOTE — Progress Notes (Signed)
 Name: Carlos Conley   MRN: [id number]    DOB: Nov 19, 1950   Date:12/30/2023       Progress Note  Subjective  Chief Complaint  Chief Complaint  Patient presents with   Medical Management of Chronic Issues   Discussed the use of AI scribe software for clinical note transcription with the patient, who gave verbal consent to proceed.  History of Present Illness Carlos Conley is a 73 year old male with a history of gross hematuria and hepatomegaly who presents for a regular follow-up visit.  He has a history of gross hematuria, with a significant episode of bleeding after lifting a heavy object while doing yard work about one month ago, which has since resolved. A CT scan for hematuria workup showed no hydronephrosis, kidney stones, or enhanced urethelial lesions, but revealed an enlarged liver with steatosis. He has not received results from a urine culture, but a dipstick test showed no blood, nitrates, or sugar. His last CT scan was in 2023, and he has previously seen a urologist for similar issues.  He has hepatomegaly with steatosis, likely related to alcohol use. He consumes about three to four beers daily and more when working around the house. He quit hard liquor in the 1980s.  He has hyperlipidemia and is currently taking Zetia  daily due to statin myopathy. His LDL cholesterol was noted to be high at 168 mg/dL.  He has a history of vitamin deficiencies, including low vitamin B1 and B12. He has not been taking vitamin B1 regularly and has missed B12 injections due to scheduling issues. He follows a diet that excludes meat, which may contribute to his low B12 levels.  He has a history of seizures since childhood, with the last episode occurring a couple of years ago. He is not currently on any seizure medications and has not experienced any recent episodes.  He has a history of atherosclerosis of the aorta and is taking Exforge  (amlodipine /valsartan ) for hypertension, which is  well-controlled with a recent blood pressure reading of 122/74 mmHg. No chest pain, palpitations, or shortness of breath.    Patient Active Problem List   Diagnosis Date Noted   Hepatomegaly 12/30/2023   Vitamin B12 deficiency 12/30/2023   Vitamin B1 deficiency 12/30/2023   Pescetarian 03/11/2023   Alcoholism (HCC) 03/09/2022   Pure hypercholesterolemia 03/09/2022   Statin intolerance 03/09/2022   Seizures (HCC) 03/09/2022   History of prostate cancer 03/09/2022   History of syphilis 03/09/2022   Hematuria 08/10/2019   Atherosclerosis of aorta (HCC) 08/10/2019   Fatty liver 08/10/2019   History of total right hip replacement 08/11/2017   Reflux esophagitis    Benign neoplasm of transverse colon    Hyperlipidemia 01/02/2016   Essential hypertension 01/02/2016   Hyperglycemia 01/02/2016   ED (erectile dysfunction) of organic origin 02/16/2013    Past Surgical History:  Procedure Laterality Date   COLONOSCOPY WITH PROPOFOL  N/A 07/28/2016   Procedure: COLONOSCOPY WITH PROPOFOL ;  Surgeon: Marnee Sink, MD;  Location: [facility] END[facility];  Service: Endoscopy;  Laterality: N/A;   PROSTATE SURGERY  2002   PROSTATE SURGERY  2002   TOTAL HIP ARTHROPLASTY Right 05/27/2017   Procedure: TOTAL HIP ARTHROPLASTY ANTERIOR APPROACH;  Surgeon: Molli Angelucci, MD;  Location: [facility] ORS;  Service: Orthopedics;  Laterality: Right;    Family History  Problem Relation Age of Onset   Cancer Father    Leukemia Father    Hypertension Sister    Hypertension Brother    Hypertension Sister  Hypertension Sister     Social History   Tobacco Use   Smoking status: Never   Smokeless tobacco: Never  Substance Use Topics   Alcohol use: Yes    Alcohol/week: 2.0 - 3.0 standard drinks of alcohol    Types: 2 - 3 Cans of beer per week    Comment: daily     Current Outpatient Medications:    amLODipine -valsartan  (EXFORGE ) 5-160 MG tablet, TAKE 1 TABLET BY MOUTH DAILY, Disp: 90 tablet, Rfl: 0   thiamine   (VITAMIN B-1) 50 MG tablet, Take 1 tablet (50 mg total) by mouth daily., Disp: 100 tablet, Rfl: 1  Allergies  Allergen Reactions   Lipitor [Atorvastatin] Rash   Pravastatin  Itching, Rash and Other (See Comments)    Redness from feet up to knees.   Simvastatin Itching and Rash    I personally reviewed active problem list, medication list, allergies, family history with the patient/caregiver today.   ROS  Ten systems reviewed and is negative except as mentioned in HPI    Objective Physical Exam Constitutional: Patient appears well-developed and well-nourished. Obese  No distress.  HEENT: head atraumatic, normocephalic, pupils equal and reactive to light, neck supple Cardiovascular: Normal rate, regular rhythm and normal heart sounds.  No murmur heard. No BLE edema. Pulmonary/Chest: Effort normal and breath sounds normal. No respiratory distress. Abdominal: Soft.  There is no tenderness. Psychiatric: Patient has a normal mood and affect. behavior is normal. Judgment and thought content normal.    Vitals:   12/30/23 0804  BP: 122/74  Pulse: 83  Resp: 16  SpO2: 97%  Weight: 214 lb (97.1 kg)  Height: 5\' 7"  (1.702 m)    Body mass index is 33.52 kg/m.  Recent Results (from the past 2160 hours)  POCT urinalysis dipstick     Status: Abnormal   Collection Time: 12/02/23  9:02 AM  Result Value Ref Range   Color, UA gold    Clarity, UA cloudy    Glucose, UA Negative Negative   Bilirubin, UA negative    Ketones, UA negative    Spec Grav, UA 1.015 1.010 - 1.025   Blood, UA large    pH, UA 6.0 5.0 - 8.0   Protein, UA Positive (A) Negative   Urobilinogen, UA 0.2 0.2 or 1.0 E.U./dL   Nitrite, UA negative    Leukocytes, UA Large (3+) (A) Negative   Appearance cloudy    Odor strong   Basic Metabolic Panel (BMET)     Status: Abnormal   Collection Time: 12/02/23  9:28 AM  Result Value Ref Range   Glucose, Bld 101 (H) 65 - 99 mg/dL    Comment: .            Fasting reference  interval . For someone without known diabetes, a glucose value between 100 and 125 mg/dL is consistent with prediabetes and should be confirmed with a follow-up test. .    BUN 5 (L) 7 - 25 mg/dL   Creat 6.04 5.40 - 9.81 mg/dL   eGFR 81 > OR = 60 XB/JYN/8.29F6   BUN/Creatinine Ratio 5 (L) 6 - 22 (calc)   Sodium 136 135 - 146 mmol/L   Potassium 4.8 3.5 - 5.3 mmol/L   Chloride 99 98 - 110 mmol/L   CO2 30 20 - 32 mmol/L   Calcium  9.7 8.6 - 10.3 mg/dL  CBC w/Diff/Platelet     Status: None   Collection Time: 12/02/23  9:28 AM  Result Value Ref Range  WBC 6.9 3.8 - 10.8 Thousand/uL   RBC 5.00 4.20 - 5.80 Million/uL   Hemoglobin 16.2 13.2 - 17.1 g/dL   HCT 25.3 66.4 - 40.3 %   MCV 95.2 80.0 - 100.0 fL   MCH 32.4 27.0 - 33.0 pg   MCHC 34.0 32.0 - 36.0 g/dL    Comment: For adults, a slight decrease in the calculated MCHC value (in the range of 30 to 32 g/dL) is most likely not clinically significant; however, it should be interpreted with caution in correlation with other red cell parameters and the patient's clinical condition.    RDW 13.9 11.0 - 15.0 %   Platelets 217 140 - 400 Thousand/uL   MPV 9.2 7.5 - 12.5 fL   Neutro Abs 5,161 1,500 - 7,800 cells/uL   Absolute Lymphocytes 1,111 850 - 3,900 cells/uL   Absolute Monocytes 462 200 - 950 cells/uL   Eosinophils Absolute 117 15 - 500 cells/uL   Basophils Absolute 48 0 - 200 cells/uL   Neutrophils Relative % 74.8 %   Total Lymphocyte 16.1 %   Monocytes Relative 6.7 %   Eosinophils Relative 1.7 %   Basophils Relative 0.7 %  Urinalysis, Routine w reflex microscopic     Status: None   Collection Time: 12/02/23  9:28 AM  Result Value Ref Range   Color, Urine YELLOW YELLOW   APPearance CLEAR CLEAR   Specific Gravity, Urine 1.012 1.001 - 1.035   pH 6.0 5.0 - 8.0   Glucose, UA NEGATIVE NEGATIVE   Bilirubin Urine NEGATIVE NEGATIVE   Ketones, ur NEGATIVE NEGATIVE   Hgb urine dipstick NEGATIVE NEGATIVE   Protein, ur NEGATIVE  NEGATIVE   Nitrite NEGATIVE NEGATIVE   Leukocytes,Ua NEGATIVE NEGATIVE      PHQ2/9:    09/16/2023    8:22 AM 06/14/2023    8:35 AM 04/09/2023    8:23 AM 03/11/2023    7:56 AM 03/10/2023    7:37 AM  Depression screen PHQ 2/9  Decreased Interest 0 0 0 0 0  Down, Depressed, Hopeless 0 0 0 0 0  PHQ - 2 Score 0 0 0 0 0  Altered sleeping 0 0  0 0  Tired, decreased energy 0 0  0 0  Change in appetite 0 0  0 0  Feeling bad or failure about yourself  0 0  0 0  Trouble concentrating 0 0  0 0  Moving slowly or fidgety/restless 0 0  0 0  Suicidal thoughts 0 0  0 0  PHQ-9 Score 0 0  0 0  Difficult doing work/chores Not difficult at all        phq 9 is negative  Fall Risk:    09/16/2023    8:21 AM 06/14/2023    8:35 AM 04/09/2023    8:19 AM 03/11/2023    7:56 AM 03/10/2023    7:37 AM  Fall Risk   Falls in the past year? 0 0 0 0 0  Number falls in past yr: 0 0 0 0 0  Injury with Fall? 0 0 0 0 0  Risk for fall due to : No Fall Risks No Fall Risks No Fall Risks No Fall Risks No Fall Risks  Follow up Falls prevention discussed;Education provided;Falls evaluation completed Falls prevention discussed Falls prevention discussed;Education provided Falls prevention discussed Falls prevention discussed    Assessment & Plan Alcohol use disorder Chronic alcohol use with hepatomegaly and steatosis. Advised cessation to prevent liver inflammation. - Advise cessation of  alcohol consumption to prevent liver inflammation.  Hyperlipidemia Elevated LDL at 168 mg/dL. Plan to switch to Nexlizet due to statin myopathy, pending insurance coverage. - Switch from Zetia  to Nexlizet if covered by insurance. - Finish current supply of Zetia  before switching. - Check cholesterol levels at next visit.  Atherosclerosis of aorta Atherosclerosis with high LDL. Plan to switch to Nexlizet for better control. - Switch from Zetia  to Nexlizet if covered by insurance. - Finish current supply of Zetia  before  switching. - Check cholesterol levels at next visit.  Vitamin B12 deficiency Managed with monthly B12 injections. Advised sublingual B12 if injections missed. - Administer B12 injection today. - Schedule monthly B12 injections before leaving the clinic. - Take sublingual B12 if unable to receive monthly injections.  Vitamin B1 deficiency Deficiency likely exacerbated by alcohol use. Advised regular supplementation. - Take Vitamin B1 supplements at least three times a week.  Hepatomegaly with steatosis Enlarged liver with fatty infiltration due to alcohol use. Advised cessation to prevent further damage. - Advise cessation of alcohol consumption to prevent liver inflammation. - Consider referral to a hepatologist if liver condition does not improve.  Gross hematuria Recurrent hematuria with negative CT scan. Possible radiation cystitis. Advised further evaluation. - Refer to urologist for further evaluation of recurrent gross hematuria. - Ensure urine culture is performed if hematuria recurs.

## 2024-01-03 ENCOUNTER — Ambulatory Visit: Admitting: Urology

## 2024-01-03 ENCOUNTER — Encounter: Payer: Self-pay | Admitting: Urology

## 2024-01-03 VITALS — BP 187/97 | HR 80 | Ht 67.0 in | Wt 210.0 lb

## 2024-01-03 DIAGNOSIS — Z8546 Personal history of malignant neoplasm of prostate: Secondary | ICD-10-CM

## 2024-01-03 DIAGNOSIS — R310 Gross hematuria: Secondary | ICD-10-CM

## 2024-01-03 LAB — MICROSCOPIC EXAMINATION: Bacteria, UA: NONE SEEN

## 2024-01-03 LAB — URINALYSIS, COMPLETE
Bilirubin, UA: NEGATIVE
Glucose, UA: NEGATIVE
Ketones, UA: NEGATIVE
Leukocytes,UA: NEGATIVE
Nitrite, UA: NEGATIVE
Protein,UA: NEGATIVE
RBC, UA: NEGATIVE
Specific Gravity, UA: 1.015 (ref 1.005–1.030)
Urobilinogen, Ur: 0.2 mg/dL (ref 0.2–1.0)
pH, UA: 6 (ref 5.0–7.5)

## 2024-01-03 NOTE — Progress Notes (Signed)
 I, Maysun Jamey Mccallum, acting as a Neurosurgeon for Carlos Knapp, MD., have documented all relevant documentation on the behalf of Carlos Knapp, MD, as directed by Carlos Knapp, MD while in the presence of Carlos Knapp, MD.  Discussed the use of AI scribe software for clinical note transcription with the patient, who gave verbal consent to proceed.   01/03/2024 5:17 PM   Raymundo Calk [birth year] [id number]  Referring provider: Sowles, Krichna, MD [address] [place],  [place] [address]  Chief Complaint  Patient presents with   Hematuria    HPI: Carlos Conley is a 73 y.o. male presents for evaluation of recurrent gross hematuria.   Initially seen 04/01/2022 with the intermittent hematuria at the end of urination, and a single episode of total gross painless hematuria.  Prior radical retropubic prostatectomy at [facility] 2003 for Gleason 4+3 adenocarcinoma Salvage radiation in 2019 for a rising PSA,  Saw PCP 12/02/23 after working in the yard and had an episode of total gross painless hematuria of the clots. For approximately 1 week afterwards, he had blood at the end of urination.  A repeat CT urogram performed 12/13/23 which showed no upper tract abnormalities.  The last PSA July 2024 was 0.05   PMH: Past Medical History:  Diagnosis Date   Arthritis    Cancer [facility])    Prostate   Hyperlipidemia    Hypertension     Surgical History: Past Surgical History:  Procedure Laterality Date   COLONOSCOPY WITH PROPOFOL  N/A 07/28/2016   Procedure: COLONOSCOPY WITH PROPOFOL ;  Surgeon: Marnee Sink, MD;  Location: [facility] ENDOSCOPY;  Service: Endoscopy;  Laterality: N/A;   PROSTATE SURGERY  2002   PROSTATE SURGERY  2002   TOTAL HIP ARTHROPLASTY Right 05/27/2017   Procedure: TOTAL HIP ARTHROPLASTY ANTERIOR APPROACH;  Surgeon: Molli Angelucci, MD;  Location: [facility] ORS;  Service: Orthopedics;  Laterality: Right;    Home Medications:  Allergies as of 01/03/2024        Reactions   Lipitor [atorvastatin] Rash   Pravastatin  Itching, Rash, Other (See Comments)   Redness from feet up to knees.   Simvastatin Itching, Rash        Medication List        Accurate as of Jan 03, 2024  5:17 PM. If you have any questions, ask your nurse or doctor.          amLODipine -valsartan  5-160 MG tablet Commonly known as: EXFORGE  Take 1 tablet by mouth daily.   Nexlizet  180-10 MG Tabs Generic drug: Bempedoic Acid-Ezetimibe  Take 1 tablet by mouth daily after breakfast.   thiamine  50 MG tablet Commonly known as: VITAMIN B-1 Take 1 tablet (50 mg total) by mouth daily.        Allergies:  Allergies  Allergen Reactions   Lipitor [Atorvastatin] Rash   Pravastatin  Itching, Rash and Other (See Comments)    Redness from feet up to knees.   Simvastatin Itching and Rash    Family History: Family History  Problem Relation Age of Onset   Cancer Father    Leukemia Father    Hypertension Sister    Hypertension Brother    Hypertension Sister    Hypertension Sister     Social History:  reports that he has never smoked. He has never used smokeless tobacco. He reports current alcohol use of about 2.0 - 3.0 standard drinks of alcohol per week. He reports that he does not use drugs.  Physical Exam: BP (!) 187/97   Pulse 80   Ht 5\' 7"  (1.702 m)   Wt 210 lb (95.3 kg)   BMI 32.89 kg/m   Constitutional:  Alert and oriented, No acute distress. HEENT: [place] AT Respiratory: Normal respiratory effort, no increased work of breathing. Psychiatric: Normal mood and affect.   Urinalysis Dipstick/microscopy negative.    Pertinent Imaging: CT was personally reviewed and interpreted.   CT HEMATURIA WORKUP  Narrative CLINICAL DATA:  right flank pain with hematuria and bloodclots  EXAM: CT ABDOMEN AND PELVIS WITHOUT AND WITH CONTRAST  TECHNIQUE: Multidetector CT imaging of the abdomen and pelvis was performed following the standard protocol before and  following the bolus administration of intravenous contrast.  RADIATION DOSE REDUCTION: This exam was performed according to the departmental dose-optimization program which includes automated exposure control, adjustment of the mA and/or kV according to patient size and/or use of iterative reconstruction technique.  CONTRAST:  OMNIPAQUE  IOHEXOL  300 MG/ML  SOLN  COMPARISON:  CT abdomen and pelvis 05/01/2022.  FINDINGS: Lower chest: No acute abnormality.  Hepatobiliary: Hepatomegaly measuring 19 cm cc with diffuse steatosis. No focal liver lesion. Normal gallbladder. No biliary ductal dilation.  Pancreas: Unremarkable. No pancreatic ductal dilatation or surrounding inflammatory changes.  Spleen: Normal in size without focal abnormality.  Adrenals/Urinary Tract: Adrenal glands are unremarkable. Kidneys are normal, without renal calculi, focal lesion, or hydronephrosis. Bladder is unremarkable.  Stomach/Bowel: Stomach is within normal limits. Appendix appears normal. No evidence of bowel wall thickening, distention, or inflammatory changes.  Vascular/Lymphatic: Aortic atherosclerosis. No enlarged abdominal or pelvic lymph nodes.  Reproductive: Prostatectomy.  Other: No abdominal wall hernia or abnormality. No abdominopelvic ascites.  Musculoskeletal: No acute or significant osseous findings. Multilevel degenerative changes. Right hip total arthroplasty.  IMPRESSION: 1. No hydronephrosis, nephrolithiasis, or enhancing urothelial lesion. 2. Hepatomegaly with steatosis. 3. Prostatectomy.   Electronically Signed By: Rox Cope M.D. On: 12/13/2023 12:47   Assessment & Plan:    1. Gross hematuria Recent CT urogram shows no upper tract abnormalities Since cystoscopy was two years ago, recommend scheduling repeat cystoscopy for lower tract evaluation.  I have reviewed the above documentation for accuracy and completeness, and I agree with the above.    Carlos Knapp, MD  [facility] [address], [address] [place], [place] [address] [phone]

## 2024-02-01 ENCOUNTER — Other Ambulatory Visit (HOSPITAL_COMMUNITY): Payer: Self-pay

## 2024-02-01 ENCOUNTER — Telehealth: Payer: Self-pay | Admitting: Pharmacy Technician

## 2024-02-01 ENCOUNTER — Ambulatory Visit

## 2024-02-01 NOTE — Telephone Encounter (Signed)
 Pharmacy Patient Advocate Encounter   Received notification from Onbase that prior authorization for Nexlizet  180-10MG  tablets is required/requested.   Insurance verification completed.   The patient is insured through [facility] .   Per test claim: PA required; PA submitted to above mentioned insurance via CoverMyMeds Key/confirmation #/EOC [id number] Status is pending

## 2024-02-03 ENCOUNTER — Other Ambulatory Visit (HOSPITAL_COMMUNITY): Payer: Self-pay

## 2024-02-08 ENCOUNTER — Other Ambulatory Visit: Payer: Self-pay | Admitting: Family Medicine

## 2024-02-08 DIAGNOSIS — E7800 Pure hypercholesterolemia, unspecified: Secondary | ICD-10-CM

## 2024-02-08 DIAGNOSIS — I700 Atherosclerosis of aorta: Secondary | ICD-10-CM

## 2024-02-08 DIAGNOSIS — I10 Essential (primary) hypertension: Secondary | ICD-10-CM

## 2024-02-10 ENCOUNTER — Encounter: Payer: Self-pay | Admitting: Urology

## 2024-02-10 ENCOUNTER — Ambulatory Visit: Admitting: Urology

## 2024-02-10 ENCOUNTER — Telehealth: Payer: Self-pay

## 2024-02-10 VITALS — BP 184/100 | HR 73 | Ht 67.0 in | Wt 212.0 lb

## 2024-02-10 DIAGNOSIS — R3129 Other microscopic hematuria: Secondary | ICD-10-CM | POA: Diagnosis not present

## 2024-02-10 DIAGNOSIS — I700 Atherosclerosis of aorta: Secondary | ICD-10-CM

## 2024-02-10 DIAGNOSIS — I10 Essential (primary) hypertension: Secondary | ICD-10-CM

## 2024-02-10 DIAGNOSIS — R310 Gross hematuria: Secondary | ICD-10-CM | POA: Diagnosis not present

## 2024-02-10 LAB — URINALYSIS, COMPLETE
Bilirubin, UA: NEGATIVE
Glucose, UA: NEGATIVE
Ketones, UA: NEGATIVE
Leukocytes,UA: NEGATIVE
Nitrite, UA: NEGATIVE
Protein,UA: NEGATIVE
RBC, UA: NEGATIVE
Specific Gravity, UA: 1.01 (ref 1.005–1.030)
Urobilinogen, Ur: 0.2 mg/dL (ref 0.2–1.0)
pH, UA: 7 (ref 5.0–7.5)

## 2024-02-10 LAB — MICROSCOPIC EXAMINATION: Epithelial Cells (non renal): >10 /HPF — AB (ref 0–10)

## 2024-02-10 MED ORDER — NEXLIZET 180-10 MG PO TABS
1.0000 | ORAL_TABLET | Freq: Every day | ORAL | 1 refills | Status: DC
Start: 2024-02-10 — End: 2024-05-02

## 2024-02-10 MED ORDER — AMLODIPINE BESYLATE-VALSARTAN 5-160 MG PO TABS
1.0000 | ORAL_TABLET | Freq: Every day | ORAL | 0 refills | Status: DC
Start: 2024-02-10 — End: 2024-05-02

## 2024-02-10 NOTE — Telephone Encounter (Signed)
Refaxed to mail order.

## 2024-02-10 NOTE — Telephone Encounter (Signed)
 Copied from CRM [id number]. Topic: Clinical - Prescription Issue >> Feb 10, 2024  8:54 AM [name] wrote: Reason for CRM: Patient received a text from [facility], stating that Dr Moss Argyle has not approved his next refill and he needs to reach out to the office. Please follow up with patient if there are any issues per his request.

## 2024-02-10 NOTE — Progress Notes (Signed)
   02/10/24  CC:  Chief Complaint  Patient presents with   Cysto    HPI: Refer to my prior note 01/03/2024.  Noted a small amount of blood in his urine 02/06/2024.  UA today negative dipstick/microscopy  Blood pressure (!) 184/100, pulse 73, height 5' 7 (1.702 m), weight 212 lb (96.2 kg).  Cystoscopy Procedure Note  Patient identification was confirmed, informed consent was obtained, and patient was prepped using Betadine solution.  Lidocaine  jelly was administered per urethral meatus.     Pre-Procedure: - Inspection reveals a normal caliber ureteral meatus.  Procedure: The flexible cystoscope was introduced without difficulty - No urethral strictures/lesions are present. - Surgically absent prostate  - Prominent hypervascularity bladder neck - Bilateral ureteral orifices identified - Bladder mucosa  reveals no solid or papillary tumors.  Prominent hypervascularity bladder neck/base consistent with radiation change - No bladder stones - No trabeculation  Retroflexion shows hypervascularity bladder neck   Post-Procedure: - Patient tolerated the procedure well  Assessment/ Plan: Gross hematuria most likely secondary to radiation changes Urine cytology sent 1 year follow-up with UA Call for increasing recurrent hematuria    [name], MD

## 2024-03-01 ENCOUNTER — Ambulatory Visit

## 2024-03-01 DIAGNOSIS — E538 Deficiency of other specified B group vitamins: Secondary | ICD-10-CM

## 2024-03-01 MED ORDER — CYANOCOBALAMIN 1000 MCG/ML IJ SOLN
1000.0000 ug | Freq: Once | INTRAMUSCULAR | Status: AC
  Administered 2024-03-01: 1000 ug via INTRAMUSCULAR

## 2024-03-01 NOTE — Progress Notes (Signed)
 Patient is in office today for a nurse visit for B12 Injection. Patient Injection was given in the  Left deltoid. Patient tolerated injection well.

## 2024-03-07 ENCOUNTER — Other Ambulatory Visit: Payer: Self-pay | Admitting: Family Medicine

## 2024-03-07 DIAGNOSIS — E7800 Pure hypercholesterolemia, unspecified: Secondary | ICD-10-CM

## 2024-03-07 DIAGNOSIS — I10 Essential (primary) hypertension: Secondary | ICD-10-CM

## 2024-03-07 DIAGNOSIS — I700 Atherosclerosis of aorta: Secondary | ICD-10-CM

## 2024-03-14 ENCOUNTER — Ambulatory Visit (INDEPENDENT_AMBULATORY_CARE_PROVIDER_SITE_OTHER): Payer: Self-pay | Admitting: Family Medicine

## 2024-03-14 ENCOUNTER — Encounter: Payer: Self-pay | Admitting: Family Medicine

## 2024-03-14 VITALS — BP 144/76 | HR 94 | Resp 16 | Ht 66.0 in | Wt 214.8 lb

## 2024-03-14 DIAGNOSIS — Z0000 Encounter for general adult medical examination without abnormal findings: Secondary | ICD-10-CM | POA: Diagnosis not present

## 2024-03-14 NOTE — Progress Notes (Signed)
 Name: Carlos Conley   MRN: [id number]    DOB: 11/13/1950   Date:03/14/2024       Progress Note  Subjective  Chief Complaint  Chief Complaint  Patient presents with   Annual Exam    HPI  Patient presents for annual CPE .   IPSS     Row Name 03/14/24 0801         International Prostate Symptom Score   How often have you had the sensation of not emptying your bladder? Not at All     How often have you had to urinate less than every two hours? Not at All     How often have you found you stopped and started again several times when you urinated? Not at All     How often have you found it difficult to postpone urination? Not at All     How often have you had a weak urinary stream? Not at All     How often have you had to strain to start urination? Not at All     How many times did you typically get up at night to urinate? 2 Times     Total IPSS Score 2       Quality of Life due to urinary symptoms   If you were to spend the rest of your life with your urinary condition just the way it is now how would you feel about that? Mostly Satisfied        Diet: discussed importance of cutting down on fried fish and french fries  Exercise: going to senior center and uses the gym three days a week and varies in duration  Last Dental Exam: up to date  Last Eye Exam: up to date   Depression: phq 9 is negative    03/14/2024    8:00 AM 09/16/2023    8:22 AM 06/14/2023    8:35 AM 04/09/2023    8:23 AM 03/11/2023    7:56 AM  Depression screen PHQ 2/9  Decreased Interest 0 0 0 0 0  Down, Depressed, Hopeless 0 0 0 0 0  PHQ - 2 Score 0 0 0 0 0  Altered sleeping  0 0  0  Tired, decreased energy  0 0  0  Change in appetite  0 0  0  Feeling bad or failure about yourself   0 0  0  Trouble concentrating  0 0  0  Moving slowly or fidgety/restless  0 0  0  Suicidal thoughts  0 0  0  PHQ-9 Score  0 0  0  Difficult doing work/chores  Not difficult at all       Hypertension:  BP Readings  from Last 3 Encounters:  03/14/24 (!) 144/76  02/10/24 (!) 184/100  01/03/24 (!) 187/97    Obesity: Wt Readings from Last 3 Encounters:  03/14/24 214 lb 12.8 oz (97.4 kg)  02/10/24 212 lb (96.2 kg)  01/03/24 210 lb (95.3 kg)   BMI Readings from Last 3 Encounters:  03/14/24 34.67 kg/m  02/10/24 33.20 kg/m  01/03/24 32.89 kg/m     [employer] Visit from 03/14/2024 in [facility]  AUDIT-C Score 0    Married STD testing and prevention (HIV/chl/gon/syphilis):  no Sexual history: not sexually active for the past 3 years  Hep C Screening: completed Skin cancer: Discussed monitoring for atypical lesions Colorectal cancer: up to date  Prostate cancer:  Dr. Camelia asked him to  have PSA checked here yearly - he had surgery in 2003 retropubic prostatectomy at [facility] and also radiation in 2019  Lab Results  Component Value Date   PSA 0.05 03/11/2023   PSA <0.1 07/25/2019   PSA 0.06 10/22/2016     Lung cancer:  Low Dose CT Chest recommended if Age 4-80 years, 30 pack-year currently smoking OR have quit w/in 15years. Patient  is not a candidate for screening   AAA: The USPSTF recommends one-time screening with ultrasonography in men ages 68 to 75 years who have ever smoked. Patient   is not a candidate for screening  ECG:  2020  Vaccines: reviewed with the patient.   Advanced Care Planning: A voluntary discussion about advance care planning including the explanation and discussion of advance directives.  Discussed health care proxy and Living will, and the patient was able to identify a health care proxy as wife.  Patient does not have a living will and power of attorney of health care   Patient Active Problem List   Diagnosis Date Noted   Hepatomegaly 12/30/2023   Vitamin B12 deficiency 12/30/2023   Vitamin B1 deficiency 12/30/2023   Pescetarian 03/11/2023   Alcoholism (HCC) 03/09/2022   Pure hypercholesterolemia 03/09/2022   Statin  intolerance 03/09/2022   Seizures (HCC) 03/09/2022   History of prostate cancer 03/09/2022   History of syphilis 03/09/2022   Hematuria 08/10/2019   Atherosclerosis of aorta (HCC) 08/10/2019   Fatty liver 08/10/2019   History of total right hip replacement 08/11/2017   Reflux esophagitis    Benign neoplasm of transverse colon    Hyperlipidemia 01/02/2016   Essential hypertension 01/02/2016   Hyperglycemia 01/02/2016   ED (erectile dysfunction) of organic origin 02/16/2013    Past Surgical History:  Procedure Laterality Date   COLONOSCOPY WITH PROPOFOL  N/A 07/28/2016   Procedure: COLONOSCOPY WITH PROPOFOL ;  Surgeon: Rogelia Copping, MD;  Location: [facility] ENDOSCOPY;  Service: Endoscopy;  Laterality: N/A;   radical retropubic prostatectomy  08/2000   TOTAL HIP ARTHROPLASTY Right 05/27/2017   Procedure: TOTAL HIP ARTHROPLASTY ANTERIOR APPROACH;  Surgeon: Kathlynn Sharper, MD;  Location: [facility] ORS;  Service: Orthopedics;  Laterality: Right;    Family History  Problem Relation Age of Onset   Cancer Father    Leukemia Father    Hypertension Sister    Hypertension Brother    Hypertension Sister    Hypertension Sister     Social History   Socioeconomic History   Marital status: Married    Spouse name: Levorn   Number of children: 3   Years of education: Not on file   Highest education level: Some college, no degree  Occupational History   Occupation: Retired    Comment: Biomedical scientist at a plant   Tobacco Use   Smoking status: Never   Smokeless tobacco: Never  Vaping Use   Vaping status: Never Used  Substance and Sexual Activity   Alcohol use: Yes    Alcohol/week: 2.0 - 3.0 standard drinks of alcohol    Types: 2 - 3 Cans of beer per week    Comment: daily   Drug use: No   Sexual activity: Yes    Partners: Female  Other Topics Concern   Not on file  Social History Narrative   Retired April of 2016      Does not eat meat except for occasional fish   Social Drivers of  Corporate investment banker Strain: Low Risk  (03/14/2024)   Overall Financial Resource  Strain (CARDIA)    Difficulty of Paying Living Expenses: Not hard at all  Food Insecurity: No Food Insecurity (03/14/2024)   Hunger Vital Sign    Worried About Running Out of Food in the Last Year: Never true    Ran Out of Food in the Last Year: Never true  Transportation Needs: No Transportation Needs (03/14/2024)   PRAPARE - Administrator, Civil Service (Medical): No    Lack of Transportation (Non-Medical): No  Physical Activity: Insufficiently Active (03/14/2024)   Exercise Vital Sign    Days of Exercise per Week: 4 days    Minutes of Exercise per Session: 20 min  Stress: No Stress Concern Present (03/14/2024)   [employer] of Occupational Health - Occupational Stress Questionnaire    Feeling of Stress: Not at all  Social Connections: Socially Integrated (03/14/2024)   Social Connection and Isolation Panel    Frequency of Communication with Friends and Family: More than three times a week    Frequency of Social Gatherings with Friends and Family: More than three times a week    Attends Religious Services: More than 4 times per year    Active Member of [employer] or Organizations: Yes    Attends Engineer, structural: More than 4 times per year    Marital Status: Married  Catering manager Violence: Not At Risk (03/14/2024)   Humiliation, Afraid, Rape, and Kick questionnaire    Fear of Current or Ex-Partner: No    Emotionally Abused: No    Physically Abused: No    Sexually Abused: No     Current Outpatient Medications:    amLODipine -valsartan  (EXFORGE ) 5-160 MG tablet, Take 1 tablet by mouth daily., Disp: 90 tablet, Rfl: 0   Bempedoic Acid-Ezetimibe  (NEXLIZET ) 180-10 MG TABS, Take 1 tablet by mouth daily after breakfast., Disp: 90 tablet, Rfl: 1   thiamine  (VITAMIN B-1) 50 MG tablet, Take 1 tablet (50 mg total) by mouth daily., Disp: 100 tablet, Rfl: 1  Allergies  Allergen  Reactions   Lipitor [Atorvastatin] Rash   Pravastatin  Itching, Rash and Other (See Comments)    Redness from feet up to knees.   Simvastatin Itching and Rash     ROS  Constitutional: Negative for fever or weight change.  Respiratory: Negative for cough and shortness of breath.   Cardiovascular: Negative for chest pain or palpitations.  Gastrointestinal: Negative for abdominal pain, no bowel changes.  Musculoskeletal: Negative for gait problem or joint swelling.  Skin: Negative for rash.  Neurological: Negative for dizziness or headache.  No other specific complaints in a complete review of systems (except as listed in HPI above).    Objective  Vitals:   03/14/24 0803 03/14/24 0840  BP: (!) 160/86 (!) 144/76  Pulse: 94   Resp: 16   SpO2: 94%   Weight: 214 lb 12.8 oz (97.4 kg)   Height: 5' 6 (1.676 m)     Body mass index is 34.67 kg/m.  Physical Exam  Constitutional: Patient appears well-developed and well-nourished. No distress.  HENT: Head: Normocephalic and atraumatic. Ears: B TMs ok, no erythema or effusion; Nose: Nose normal. Mouth/Throat: Oropharynx is clear and moist. No oropharyngeal exudate.  Eyes: Conjunctivae and EOM are normal. Pupils are equal, round, and reactive to light. No scleral icterus.  Neck: Normal range of motion. Neck supple. No JVD present. No thyromegaly present.  Cardiovascular: Normal rate, regular rhythm and normal heart sounds.  No murmur heard. No BLE edema. Pulmonary/Chest: Effort normal and breath  sounds normal. No respiratory distress. Abdominal: Soft. Bowel sounds are normal, no distension. There is no tenderness. no masses MALE GENITALIA: Normal descended testes bilaterally, varicocele  no hernias, no lesions, no discharge RECTAL: not done  Musculoskeletal: Normal range of motion, no joint effusions. No gross deformities Neurological: he is alert and oriented to person, place, and time. No cranial nerve deficit. Coordination, balance,  strength, speech and gait are normal.  Skin: Skin is warm and dry. No rash noted. No erythema.  Psychiatric: Patient has a normal mood and affect. behavior is normal. Judgment and thought content normal.     Assessment & Plan  1. Well adult exam (Primary)   Discussed again importance of quitting drinking due to enlarged liver and also elevated LFT's, risk of liver cancer  -Prostate cancer screening and PSA options (with potential risks and benefits of testing vs not testing) were discussed along with recent recs/guidelines. -USPSTF grade A and B recommendations reviewed with patient; age-appropriate recommendations, preventive care, screening tests, etc discussed and encouraged; healthy living encouraged; see AVS for patient education given to patient -Discussed importance of 150 minutes of physical activity weekly, eat two servings of fish weekly, eat one serving of tree nuts ( cashews, pistachios, pecans, almonds.SABRA) every other day, eat 6 servings of fruit/vegetables daily and drink plenty of water and avoid sweet beverages.  -Reviewed Health Maintenance: yes

## 2024-03-21 ENCOUNTER — Telehealth: Payer: Self-pay | Admitting: Urology

## 2024-03-21 NOTE — Telephone Encounter (Signed)
Urine cytology showed no abnormal cells 

## 2024-03-22 NOTE — Telephone Encounter (Signed)
 Called and left detailed message that cytology report showed no abnormal cells

## 2024-04-04 ENCOUNTER — Ambulatory Visit

## 2024-04-05 ENCOUNTER — Ambulatory Visit (INDEPENDENT_AMBULATORY_CARE_PROVIDER_SITE_OTHER): Admitting: Emergency Medicine

## 2024-04-05 ENCOUNTER — Ambulatory Visit

## 2024-04-05 DIAGNOSIS — E538 Deficiency of other specified B group vitamins: Secondary | ICD-10-CM

## 2024-04-05 MED ORDER — CYANOCOBALAMIN 1000 MCG/ML IJ SOLN
1000.0000 ug | Freq: Once | INTRAMUSCULAR | Status: AC
  Administered 2024-04-05 (×2): 1000 ug via INTRAMUSCULAR

## 2024-04-13 ENCOUNTER — Ambulatory Visit: Payer: Medicare Other

## 2024-04-13 VITALS — BP 130/80 | HR 79 | Wt 209.0 lb

## 2024-04-13 DIAGNOSIS — Z0000 Encounter for general adult medical examination without abnormal findings: Secondary | ICD-10-CM

## 2024-04-13 NOTE — Patient Instructions (Addendum)
 Mr. Carlos Conley , Thank you for taking time out of your busy schedule to complete your Annual Wellness Visit with me. I enjoyed our conversation and look forward to speaking with you again next year. I, as well as your care team,  appreciate your ongoing commitment to your health goals. Please review the following plan we discussed and let me know if I can assist you in the future.   Follow up Visits: 04/19/25 @ 11:30 AM BY PHONE We will see or speak with you next year for your Next Medicare AWV with our clinical staff Have you seen your provider in the last 6 months (3 months if uncontrolled diabetes)? Yes  Clinician Recommendations:  Aim for 30 minutes of exercise or brisk walking, 6-8 glasses of water, and 5 servings of fruits and vegetables each day. TAKE CARE!      This is a list of the screenings recommended for you:  Health Maintenance  Topic Date Due   COVID-19 Vaccine (7 - 2024-25 season) 04/25/2023   Flu Shot  03/24/2024   Medicare Annual Wellness Visit  04/13/2025   Colon Cancer Screening  07/28/2026   DTaP/Tdap/Td vaccine (3 - Td or Tdap) 07/28/2033   Pneumococcal Vaccine for age over 38  Completed   Hepatitis C Screening  Completed   Zoster (Shingles) Vaccine  Completed   HPV Vaccine  Aged Out   Meningitis B Vaccine  Aged Out    Advanced directives: (ACP Link)Information on Advanced Care Planning can be found at [place]  Best boy Advance Health Care Directives Advance Health Care Directives. [email or web address]  Advance Care Planning is important because it:  [x]  Makes sure you receive the medical care that is consistent with your values, goals, and preferences  [x]  It provides guidance to your family and loved ones and reduces their decisional burden about whether or not they are making the right decisions based on your wishes.  Follow the link provided in your after visit summary or read over the paperwork we have mailed to you to help you started getting your  Advance Directives in place. If you need assistance in completing these, please reach out to us  so that we can help you!

## 2024-04-13 NOTE — Progress Notes (Signed)
 Subjective:   Carlos Conley is a 73 y.o. who presents for a Medicare Wellness preventive visit.  As a reminder, Annual Wellness Visits don't include a physical exam, and some assessments may be limited, especially if this visit is performed virtually. We may recommend an in-person follow-up visit with your provider if needed.  Visit Complete: Virtual I connected with  Carlos Conley on 04/13/24 by a audio enabled telemedicine application and verified that I am speaking with the correct person using two identifiers.  Patient Location: Home  Provider Location: Home Office  I discussed the limitations of evaluation and management by telemedicine. The patient expressed understanding and agreed to proceed.  Vital Signs: Because this visit was a virtual/telehealth visit, some criteria may be missing or patient reported. Any vitals not documented were not able to be obtained and vitals that have been documented are patient reported.  VideoDeclined- This patient declined Librarian, academic. Therefore the visit was completed with audio only.  Persons Participating in Visit: Patient.  AWV Questionnaire: No: Patient Medicare AWV questionnaire was not completed prior to this visit.  Cardiac Risk Factors include: advanced age (>49men, >67 women);dyslipidemia;male gender;hypertension;obesity (BMI >30kg/m2)     Objective:    There were no vitals filed for this visit. There is no height or weight on file to calculate BMI.     04/13/2024   11:46 AM 04/09/2023    8:25 AM 06/18/2022    4:01 PM 05/23/2022   12:03 PM 06/12/2021    8:47 AM 02/19/2021   10:12 AM 06/11/2020    8:45 AM  Advanced Directives  Does Patient Have a Medical Advance Directive? No No No No No No No  Would patient like information on creating a medical advance directive? No - Patient declined  No - Patient declined No - Patient declined Yes (MAU/Ambulatory/Procedural Areas - Information  given) No - Patient declined Yes (MAU/Ambulatory/Procedural Areas - Information given)    Current Medications (verified) Outpatient Encounter Medications as of 04/13/2024  Medication Sig   amLODipine -valsartan  (EXFORGE ) 5-160 MG tablet Take 1 tablet by mouth daily.   Bempedoic Acid-Ezetimibe  (NEXLIZET ) 180-10 MG TABS Take 1 tablet by mouth daily after breakfast.   thiamine  (VITAMIN B-1) 50 MG tablet Take 1 tablet (50 mg total) by mouth daily.   No facility-administered encounter medications on file as of 04/13/2024.    Allergies (verified) Lipitor [atorvastatin], Pravastatin , and Simvastatin   History: Past Medical History:  Diagnosis Date   Arthritis    Cancer (HCC)    Prostate   Hyperlipidemia    Hypertension    Past Surgical History:  Procedure Laterality Date   COLONOSCOPY WITH PROPOFOL  N/A 07/28/2016   Procedure: COLONOSCOPY WITH PROPOFOL ;  Surgeon: Rogelia Copping, MD;  Location: [facility] END[facility]COPY;  Service: Endoscopy;  Laterality: N/A;   radical retropubic prostatectomy  08/2000   TOTAL HIP ARTHROPLASTY Right 05/27/2017   Procedure: TOTAL HIP ARTHROPLASTY ANTERIOR APPROACH;  Surgeon: Kathlynn Sharper, MD;  Location: [facility] ORS;  Service: Orthopedics;  Laterality: Right;   Family History  Problem Relation Age of Onset   Cancer Father    Leukemia Father    Hypertension Sister    Hypertension Brother    Hypertension Sister    Hypertension Sister    Social History   Socioeconomic History   Marital status: Married    Spouse name: Carlos Conley   Number of children: 3   Years of education: Not on file   Highest education level: Some college, no  degree  Occupational History   Occupation: Retired    Comment: Biomedical scientist at a plant   Tobacco Use   Smoking status: Never   Smokeless tobacco: Never  Vaping Use   Vaping status: Never Used  Substance and Sexual Activity   Alcohol use: Yes    Alcohol/week: 2.0 - 3.0 standard drinks of alcohol    Types: 2 - 3 Cans of beer per  week    Comment: daily   Drug use: No   Sexual activity: Yes    Partners: Female  Other Topics Concern   Not on file  Social History Narrative   Retired April of 2016      Does not eat meat except for occasional fish   Social Drivers of Corporate investment banker Strain: Low Risk  (04/13/2024)   Overall Financial Resource Strain (CARDIA)    Difficulty of Paying Living Expenses: Not hard at all  Food Insecurity: No Food Insecurity (04/13/2024)   Hunger Vital Sign    Worried About Running Out of Food in the Last Year: Never true    Ran Out of Food in the Last Year: Never true  Transportation Needs: No Transportation Needs (04/13/2024)   PRAPARE - Administrator, Civil Service (Medical): No    Lack of Transportation (Non-Medical): No  Physical Activity: Insufficiently Active (04/13/2024)   Exercise Vital Sign    Days of Exercise per Week: 7 days    Minutes of Exercise per Session: 20 min  Stress: No Stress Concern Present (04/13/2024)   [employer] of Occupational Health - Occupational Stress Questionnaire    Feeling of Stress: Not at all  Social Connections: Socially Integrated (04/13/2024)   Social Connection and Isolation Panel    Frequency of Communication with Friends and Family: More than three times a week    Frequency of Social Gatherings with Friends and Family: More than three times a week    Attends Religious Services: More than 4 times per year    Active Member of [employer] or Organizations: Yes    Attends Engineer, structural: More than 4 times per year    Marital Status: Married    Tobacco Counseling Counseling given: Not Answered    Clinical Intake:  Pre-visit preparation completed: Yes  Pain : No/denies pain     BMI - recorded: 34.5 Nutritional Status: BMI > 30  Obese Nutritional Risks: None Diabetes: No  Lab Results  Component Value Date   HGBA1C 5.6 06/14/2023   HGBA1C 5.3 03/09/2022   HGBA1C 5.4 03/06/2021     How  often do you need to have someone help you when you read instructions, pamphlets, or other written materials from your doctor or pharmacy?: 1 - Never  Interpreter Needed?: No  Information entered by :: JHONNIE DAS, LPN   Activities of Daily Living     04/13/2024   11:47 AM 03/14/2024    8:01 AM  In your present state of health, do you have any difficulty performing the following activities:  Hearing? 0 0  Vision? 0 0  Difficulty concentrating or making decisions? 0 0  Walking or climbing stairs? 0 0  Dressing or bathing? 0 0  Doing errands, shopping? 0 0  Preparing Food and eating ? N   Using the Toilet? N   In the past six months, have you accidently leaked urine? N   Do you have problems with loss of bowel control? N   Managing your Medications? N  Managing your Finances? N   Housekeeping or managing your Housekeeping? N     Patient Care Team: Sowles, Krichna, MD as PCP - General (Family Medicine) Lenn Aran, MD as Referring Physician (Radiation Oncology)  I have updated your Care Teams any recent Medical Services you may have received from other providers in the past year.     Assessment:   This is a routine wellness examination for Carlos Conley.  Hearing/Vision screen Hearing Screening - Comments:: NO AIDS Vision Screening - Comments:: WEARS GLASSES ALL DAY- LENSCRAFTERS   Goals Addressed             This Visit's Progress    DIET - EAT MORE FRUITS AND VEGETABLES         Depression Screen     04/13/2024   11:45 AM 03/14/2024    8:00 AM 09/16/2023    8:22 AM 06/14/2023    8:35 AM 04/09/2023    8:23 AM 03/11/2023    7:56 AM 03/10/2023    7:37 AM  PHQ 2/9 Scores  PHQ - 2 Score 0 0 0 0 0 0 0  PHQ- 9 Score 0  0 0  0 0    Fall Risk     04/13/2024   11:47 AM 03/14/2024    8:00 AM 09/16/2023    8:21 AM 06/14/2023    8:35 AM 04/09/2023    8:19 AM  Fall Risk   Falls in the past year? 0 0 0 0 0  Number falls in past yr: 0 0 0 0 0  Injury with Fall? 0 0 0 0 0   Risk for fall due to : No Fall Risks No Fall Risks No Fall Risks No Fall Risks No Fall Risks  Follow up Falls evaluation completed;Falls prevention discussed Falls evaluation completed Falls prevention discussed;Education provided;Falls evaluation completed Falls prevention discussed Falls prevention discussed;Education provided    MEDICARE RISK AT HOME:  Medicare Risk at Home Any stairs in or around the home?: Yes If so, are there any without handrails?: No Home free of loose throw rugs in walkways, pet beds, electrical cords, etc?: Yes Adequate lighting in your home to reduce risk of falls?: Yes Life alert?: No Use of a cane, walker or w/c?: No Grab bars in the bathroom?: Yes Shower chair or bench in shower?: No Elevated toilet seat or a handicapped toilet?: Yes  TIMED UP AND GO:  Was the test performed?  No  Cognitive Function: 6CIT completed        04/13/2024   11:48 AM 04/09/2023    8:30 AM 06/18/2022    1:28 PM 06/11/2020    8:48 AM 06/06/2019    9:04 AM  6CIT Screen  What Year? 0 points 0 points 0 points 0 points 0 points  What month? 0 points 0 points 0 points 0 points 0 points  What time? 0 points 0 points 0 points 0 points 0 points  Count back from 20 0 points 0 points 0 points 0 points 0 points  Months in reverse 0 points 0 points 0 points 0 points 0 points  Repeat phrase 0 points 0 points 0 points 4 points 4 points  Total Score 0 points 0 points 0 points 4 points 4 points    Immunizations Immunization History  Administered Date(s) Administered   Fluad Quad(high Dose 65+) 05/15/2019, 06/04/2020, 06/14/2022   Influenza, High Dose Seasonal PF 05/24/2018, 06/26/2021, 06/07/2023   Influenza,inj,Quad PF,6+ Mos 06/19/2015, 06/02/2016, 07/09/2017   Influenza-Unspecified 06/04/2013  Moderna Covid-19 Vaccine Bivalent Booster 35yrs & up 07/25/2021, 06/14/2022   Moderna Sars-Covid-2 Vaccination 10/18/2019, 11/15/2019, 07/16/2020, 01/14/2021   Pneumococcal  Conjugate-13 05/24/2018   Pneumococcal Polysaccharide-23 06/06/2019   Respiratory Syncytial Virus Vaccine,Recomb Aduvanted(Arexvy) 04/04/2023   Tdap 03/26/2016, 07/29/2023   Zoster Recombinant(Shingrix) 09/21/2021, 11/23/2021, 06/07/2023   Zoster, Live 03/26/2016    Screening Tests Health Maintenance  Topic Date Due   COVID-19 Vaccine (7 - 2024-25 season) 04/25/2023   INFLUENZA VACCINE  03/24/2024   Medicare Annual Wellness (AWV)  04/13/2025   Colonoscopy  07/28/2026   DTaP/Tdap/Td (3 - Td or Tdap) 07/28/2033   Pneumococcal Vaccine: 50+ Years  Completed   Hepatitis C Screening  Completed   Zoster Vaccines- Shingrix  Completed   HPV VACCINES  Aged Out   Meningococcal B Vaccine  Aged Out    Health Maintenance  Health Maintenance Due  Topic Date Due   COVID-19 Vaccine (7 - 2024-25 season) 04/25/2023   INFLUENZA VACCINE  03/24/2024   Health Maintenance Items Addressed: UP TO DATE ON SHOTS EXCEPT COVID; UP TO DATE ON COLONOSCOPY  Additional Screening:  Vision Screening: Recommended annual ophthalmology exams for early detection of glaucoma and other disorders of the eye. Would you like a referral to an eye doctor? No    Dental Screening: Recommended annual dental exams for proper oral hygiene  Community Resource Referral / Chronic Care Management: CRR required this visit?  No   CCM required this visit?  No   Plan:    I have personally reviewed and noted the following in the patient's chart:   Medical and social history Use of alcohol, tobacco or illicit drugs  Current medications and supplements including opioid prescriptions. Patient is not currently taking opioid prescriptions. Functional ability and status Nutritional status Physical activity Advanced directives List of other physicians Hospitalizations, surgeries, and ER visits in previous 12 months Vitals Screenings to include cognitive, depression, and falls Referrals and appointments  In addition, I  have reviewed and discussed with patient certain preventive protocols, quality metrics, and best practice recommendations. A written personalized care plan for preventive services as well as general preventive health recommendations were provided to patient.   Jhonnie GORMAN Das, LPN   1/78/7974   After Visit Summary: (MyChart) Due to this being a telephonic visit, the after visit summary with patients personalized plan was offered to patient via MyChart   Notes: Nothing significant to report at this time.

## 2024-05-02 ENCOUNTER — Encounter: Payer: Self-pay | Admitting: Family Medicine

## 2024-05-02 ENCOUNTER — Ambulatory Visit (INDEPENDENT_AMBULATORY_CARE_PROVIDER_SITE_OTHER): Admitting: Family Medicine

## 2024-05-02 VITALS — BP 152/84 | HR 95 | Temp 98.0°F | Resp 16 | Ht 66.0 in | Wt 215.4 lb

## 2024-05-02 DIAGNOSIS — R310 Gross hematuria: Secondary | ICD-10-CM | POA: Diagnosis not present

## 2024-05-02 DIAGNOSIS — R569 Unspecified convulsions: Secondary | ICD-10-CM | POA: Diagnosis not present

## 2024-05-02 DIAGNOSIS — R7989 Other specified abnormal findings of blood chemistry: Secondary | ICD-10-CM | POA: Diagnosis not present

## 2024-05-02 DIAGNOSIS — F1020 Alcohol dependence, uncomplicated: Secondary | ICD-10-CM | POA: Diagnosis not present

## 2024-05-02 DIAGNOSIS — E538 Deficiency of other specified B group vitamins: Secondary | ICD-10-CM | POA: Diagnosis not present

## 2024-05-02 DIAGNOSIS — E7800 Pure hypercholesterolemia, unspecified: Secondary | ICD-10-CM | POA: Diagnosis not present

## 2024-05-02 DIAGNOSIS — J069 Acute upper respiratory infection, unspecified: Secondary | ICD-10-CM | POA: Diagnosis not present

## 2024-05-02 DIAGNOSIS — Z8546 Personal history of malignant neoplasm of prostate: Secondary | ICD-10-CM

## 2024-05-02 DIAGNOSIS — I700 Atherosclerosis of aorta: Secondary | ICD-10-CM

## 2024-05-02 DIAGNOSIS — I10 Essential (primary) hypertension: Secondary | ICD-10-CM

## 2024-05-02 DIAGNOSIS — R160 Hepatomegaly, not elsewhere classified: Secondary | ICD-10-CM

## 2024-05-02 DIAGNOSIS — Z789 Other specified health status: Secondary | ICD-10-CM

## 2024-05-02 LAB — POC COVID19/FLU A&B COMBO
Covid Antigen, POC: NEGATIVE
Influenza A Antigen, POC: NEGATIVE
Influenza B Antigen, POC: NEGATIVE

## 2024-05-02 MED ORDER — AMLODIPINE BESYLATE-VALSARTAN 5-320 MG PO TABS: 1.0000 | ORAL_TABLET | Freq: Every day | ORAL | 1 refills | Status: DC

## 2024-05-02 MED ORDER — BENZONATATE 100 MG PO CAPS: 100.0000 mg | ORAL_CAPSULE | Freq: Three times a day (TID) | ORAL | 0 refills | Status: DC | PRN

## 2024-05-02 MED ORDER — CYANOCOBALAMIN 1000 MCG/ML IJ SOLN
1000.0000 ug | Freq: Once | INTRAMUSCULAR | Status: AC
  Administered 2024-05-02: 1000 ug via INTRAMUSCULAR

## 2024-05-02 MED ORDER — NEXLIZET 180-10 MG PO TABS: 1.0000 | ORAL_TABLET | Freq: Every day | ORAL | 1 refills | Status: DC

## 2024-05-02 NOTE — Progress Notes (Signed)
 Name: Carlos Conley   MRN: [id number]    DOB: [birth year]   Date:05/02/2024       Progress Note  Subjective  Chief Complaint  Chief Complaint  Patient presents with   Medical Management of Chronic Issues    Pt has not been taking NEXLIZET  due to pharmacy never received rx   Discussed the use of AI scribe software for clinical note transcription with the patient, who gave verbal consent to proceed.  History of Present Illness Carlos Conley is a 73 year old male with hypercholesterolemia and atherosclerosis who presents for a follow-up visit.  He has developed a cough and drainage since Sunday morning, which began after getting up for church. He has been taking Mucinex and Aplisol for these symptoms. No sore throat, wheezing, shortness of breath, fever, or chills. He is coughing up phlegm but does not report any significant improvement or worsening of symptoms.  He continues to take his blood pressure medication, Exforge , and his cholesterol medication, ezetimibe  (Zetia ). He has not received his new cholesterol medication due to issues with delivery and insurance coverage. He has a history of trying multiple statins, including Lipitor, Pravastatin , and Simvastatin, which caused muscle aches, and therefore cannot take them. Recent blood pressure readings include 152/84 today, 144/76 in July, and 184/100 in June. No caffeine or alcohol intake affecting his blood pressure and confirms adherence to his current medication regimen.  He has a history of hepatomegaly and steatosis, with a noted history of alcoholism, currently drinking three to four beers a day but no hard liquor since 1980. He frequents a senior center instead of bars, which he finds beneficial.  He has a history of seizures in childhood with one episode a few years ago but denies any recent seizures or passing out.  He experienced gross hematuria in the past, attributed to radiation therapy for prostate cancer. He saw a  specialist in June who performed a cystoscopy and CT, attributing the hematuria to radiation changes.  He is on vitamin B12 injections monthly and takes vitamin B1 over the counter, though he reports not taking it today due to a bad taste.    Patient Active Problem List   Diagnosis Date Noted   Hepatomegaly 12/30/2023   Vitamin B12 deficiency 12/30/2023   Vitamin B1 deficiency 12/30/2023   Pescetarian 03/11/2023   Alcoholism (HCC) 03/09/2022   Pure hypercholesterolemia 03/09/2022   Statin intolerance 03/09/2022   Seizures (HCC) 03/09/2022   History of prostate cancer 03/09/2022   History of syphilis 03/09/2022   Hematuria 08/10/2019   Atherosclerosis of aorta (HCC) 08/10/2019   Fatty liver 08/10/2019   History of total right hip replacement 08/11/2017   Reflux esophagitis    Benign neoplasm of transverse colon    Hyperlipidemia 01/02/2016   Essential hypertension 01/02/2016   ED (erectile dysfunction) of organic origin 02/16/2013    Past Surgical History:  Procedure Laterality Date   COLONOSCOPY WITH PROPOFOL  N/A 07/28/2016   Procedure: COLONOSCOPY WITH PROPOFOL ;  Surgeon: Rogelia Copping, MD;  Location: [facility] END[facility];  Service: Endoscopy;  Laterality: N/A;   radical retropubic prostatectomy  08/2000   TOTAL HIP ARTHROPLASTY Right 05/27/2017   Procedure: TOTAL HIP ARTHROPLASTY ANTERIOR APPROACH;  Surgeon: Kathlynn Sharper, MD;  Location: [facility] OR[facility];  Service: Orthopedics;  Laterality: Right;    Family History  Problem Relation Age of Onset   Cancer Father    Leukemia Father    Hypertension Sister    Hypertension Brother    Hypertension Sister  Hypertension Sister     Social History   Tobacco Use   Smoking status: Never   Smokeless tobacco: Never  Substance Use Topics   Alcohol use: Yes    Alcohol/week: 2.0 - 3.0 standard drinks of alcohol    Types: 2 - 3 Cans of beer per week    Comment: daily     Current Outpatient Medications:    amLODipine -valsartan  (EXFORGE )  5-320 MG tablet, Take 1 tablet by mouth daily., Disp: 90 tablet, Rfl: 1   benzonatate  (TESSALON ) 100 MG capsule, Take 1 capsule (100 mg total) by mouth 3 (three) times daily as needed for cough., Disp: 40 capsule, Rfl: 0   thiamine  (VITAMIN B-1) 50 MG tablet, Take 1 tablet (50 mg total) by mouth daily., Disp: 100 tablet, Rfl: 1   Bempedoic Acid-Ezetimibe  (NEXLIZET ) 180-10 MG TABS, Take 1 tablet by mouth daily after breakfast., Disp: 90 tablet, Rfl: 1  Allergies  Allergen Reactions   Lipitor [Atorvastatin] Rash   Pravastatin  Itching, Rash and Other (See Comments)    Redness from feet up to knees.   Simvastatin Itching and Rash    I personally reviewed active problem list, medication list, allergies with the patient/caregiver today.   ROS  Ten systems reviewed and is negative except as mentioned in HPI    Objective Physical Exam  CONSTITUTIONAL: Patient appears well-developed and well-nourished. No distress. HEENT: Head atraumatic, normocephalic, neck supple. CARDIOVASCULAR: Normal rate, regular rhythm and normal heart sounds. No murmur heard. No BLE edema. PULMONARY: Effort normal and breath sounds clear bilaterally. No respiratory distress. ABDOMINAL: Abdomen non-tender. No distention. MUSCULOSKELETAL: Normal gait. Without gross motor or sensory deficit. PSYCHIATRIC: Patient has a normal mood and affect. Behavior is normal. Judgment and thought content normal.  Vitals:   05/02/24 0838 05/02/24 0903  BP: (!) 152/84 (!) 152/84  Pulse: 95   Resp: 16   Temp: 98 F (36.7 C)   TempSrc: Oral   SpO2: 98%   Weight: 215 lb 6.4 oz (97.7 kg)   Height: 5' 6 (1.676 m)     Body mass index is 34.77 kg/m.  Recent Results (from the past 2160 hours)  Urinalysis, Complete     Status: None   Collection Time: 02/10/24 10:14 AM  Result Value Ref Range   Specific Gravity, UA 1.010 1.005 - 1.030   pH, UA 7.0 5.0 - 7.5   Color, UA Yellow Yellow   Appearance Ur Clear Clear    Leukocytes,UA Negative Negative   Protein,UA Negative Negative/Trace   Glucose, UA Negative Negative   Ketones, UA Negative Negative   RBC, UA Negative Negative   Bilirubin, UA Negative Negative   Urobilinogen, Ur 0.2 0.2 - 1.0 mg/dL   Nitrite, UA Negative Negative   Microscopic Examination Comment     Comment: Microscopic follows if indicated.   Microscopic Examination See below:     Comment: Microscopic was indicated and was performed.  Microscopic Examination     Status: Abnormal   Collection Time: 02/10/24 10:14 AM   Urine  Result Value Ref Range   WBC, UA 0-5 0 - 5 /hpf   RBC, Urine 0-2 0 - 2 /hpf   Epithelial Cells (non renal) >10 (A) 0 - 10 /hpf   Bacteria, UA Few None seen/Few  POC Covid19/Flu A&B Antigen     Status: None   Collection Time: 05/02/24  9:21 AM  Result Value Ref Range   Influenza A Antigen, POC Negative Negative   Influenza B Antigen, POC Negative  Negative   Covid Antigen, POC Negative Negative    Diabetic Foot Exam:     PHQ2/9:    05/02/2024    8:31 AM 04/13/2024   11:45 AM 03/14/2024    8:00 AM 09/16/2023    8:22 AM 06/14/2023    8:35 AM  Depression screen PHQ 2/9  Decreased Interest 0 0 0 0 0  Down, Depressed, Hopeless 0 0 0 0 0  PHQ - 2 Score 0 0 0 0 0  Altered sleeping 0 0  0 0  Tired, decreased energy 0 0  0 0  Change in appetite 0 0  0 0  Feeling bad or failure about yourself  0 0  0 0  Trouble concentrating 0 0  0 0  Moving slowly or fidgety/restless 0 0  0 0  Suicidal thoughts 0 0  0 0  PHQ-9 Score 0 0  0 0  Difficult doing work/chores Not difficult at all Not difficult at all  Not difficult at all     phq 9 is negative  Fall Risk:    05/02/2024    8:31 AM 04/13/2024   11:47 AM 03/14/2024    8:00 AM 09/16/2023    8:21 AM 06/14/2023    8:35 AM  Fall Risk   Falls in the past year? 0 0 0 0 0  Number falls in past yr: 0 0 0 0 0  Injury with Fall? 0 0 0 0 0  Risk for fall due to : No Fall Risks No Fall Risks No Fall Risks No Fall  Risks No Fall Risks  Follow up Falls evaluation completed Falls evaluation completed;Falls prevention discussed Falls evaluation completed Falls prevention discussed;Education provided;Falls evaluation completed Falls prevention discussed      Assessment & Plan Acute upper respiratory infection with cough Acute upper respiratory infection with cough, no fever or respiratory distress. - Prescribe Tessalon  Pearls for cough management. - Perform COVID-19 and flu swab testing.  Essential hypertension, poorly controlled Hypertension poorly controlled, current regimen insufficient. Blood pressure elevated. - Increase Exforge  dose to 5/320 mg.  Atherosclerosis of aorta and pure hypercholesterolemia, statin intolerant Atherosclerosis and hypercholesterolemia, statin intolerant. Ezetimibe  currently used. - Send new cholesterol medication prescription to [facility]. - Discontinue ezetimibe  once new medication is received. - Check cholesterol levels after 1-2 months on new medication.  Alcohol dependence with hepatomegaly and hepatic steatosis Alcohol dependence with liver complications. Advised reduction in alcohol for liver health. - Advise on reducing alcohol intake. - Encourage a healthy diet, focusing on a vegetarian diet and avoiding processed foods, red meat, and fried foods.  Vitamin B12 and B1 deficiency Vitamin B12 and B1 deficiency, managed with injections and supplements. - Continue monthly B12 injections. - Ensure regular intake of B1 supplements. - Check B12, B1, and folic acid  levels before next B12 injection.  History of malignant neoplasm of prostate, post-radiation, with radiation cystitis and gross hematuria Post-radiation prostate cancer with radiation cystitis. No current hematuria. - Schedule one-year follow-up with urologist. - Monitor for recurrent hematuria. - Check PSA levels.  Seizure disorder, not currently medicated History of seizures, not medicated. Alcohol is a  seizure risk factor. - Advise on the risks of alcohol consumption related to seizures.

## 2024-05-12 ENCOUNTER — Telehealth: Payer: Self-pay | Admitting: Family Medicine

## 2024-05-12 NOTE — Telephone Encounter (Signed)
 Ezetimibe  tab

## 2024-05-12 NOTE — Telephone Encounter (Signed)
 Unable to refill no longer on active med list

## 2024-05-26 ENCOUNTER — Telehealth: Payer: Self-pay

## 2024-05-26 NOTE — Telephone Encounter (Signed)
 Copied from CRM [id number]. Topic: Clinical - Request for Lab/Test Order >> May 26, 2024  8:48 AM Wess RAMAN wrote: Reason for CRM: Patient would like to know when he needs to come to the labs for blood draw.  Callback #: [phone]

## 2024-05-29 NOTE — Telephone Encounter (Signed)
 Lvm informing patient

## 2024-06-02 ENCOUNTER — Ambulatory Visit (INDEPENDENT_AMBULATORY_CARE_PROVIDER_SITE_OTHER)

## 2024-06-02 ENCOUNTER — Telehealth: Payer: Self-pay

## 2024-06-02 ENCOUNTER — Telehealth: Payer: Self-pay | Admitting: Pharmacy Technician

## 2024-06-02 ENCOUNTER — Telehealth: Payer: Self-pay | Admitting: Family Medicine

## 2024-06-02 ENCOUNTER — Other Ambulatory Visit (HOSPITAL_COMMUNITY): Payer: Self-pay

## 2024-06-02 DIAGNOSIS — E538 Deficiency of other specified B group vitamins: Secondary | ICD-10-CM

## 2024-06-02 DIAGNOSIS — I700 Atherosclerosis of aorta: Secondary | ICD-10-CM | POA: Diagnosis not present

## 2024-06-02 DIAGNOSIS — R7989 Other specified abnormal findings of blood chemistry: Secondary | ICD-10-CM | POA: Diagnosis not present

## 2024-06-02 DIAGNOSIS — I10 Essential (primary) hypertension: Secondary | ICD-10-CM | POA: Diagnosis not present

## 2024-06-02 MED ORDER — CYANOCOBALAMIN 1000 MCG/ML IJ SOLN
1000.0000 ug | Freq: Once | INTRAMUSCULAR | Status: AC
  Administered 2024-06-02: 1000 ug via INTRAMUSCULAR

## 2024-06-02 NOTE — Telephone Encounter (Signed)
 I have already sent an encounter to [facility] PA team to do NEXLIZET  PA.

## 2024-06-02 NOTE — Telephone Encounter (Signed)
 Called back no answer left detailed vm

## 2024-06-02 NOTE — Telephone Encounter (Signed)
 Pharmacy Patient Advocate Encounter   Received notification from Pt Calls Messages that prior authorization for Nexlizet  180-10 mg tabs is required/requested.   Insurance verification completed.   The patient is insured through [facility].   Per test claim: The current 90 day co-pay is, $396.00.  No PA needed at this time. This test claim was processed through [facility]- copay amounts may vary at other pharmacies due to pharmacy/plan contracts, or as the patient moves through the different stages of their insurance plan.    Mr. Schaberg PA is good until 08/02/2024  30 day co-pay is $302.00

## 2024-06-02 NOTE — Telephone Encounter (Signed)
 PA request has been Received. New Encounter has been or will be created for follow up. For additional info see Pharmacy Prior Auth telephone encounter from 06/02/24.

## 2024-06-02 NOTE — Telephone Encounter (Signed)
 Can we please initiate a PA for this medication.

## 2024-06-02 NOTE — Telephone Encounter (Signed)
Called pt back no answer left vm to call back

## 2024-06-02 NOTE — Telephone Encounter (Signed)
 Pt is only taking bp and cholesterol meds (not sure of the name of them) and state insurance will not cover the new prescription that dr glenard had written but will cover the one he is taking now.

## 2024-06-05 ENCOUNTER — Telehealth: Payer: Self-pay

## 2024-06-05 ENCOUNTER — Other Ambulatory Visit: Payer: Self-pay | Admitting: Family Medicine

## 2024-06-05 DIAGNOSIS — I10 Essential (primary) hypertension: Secondary | ICD-10-CM

## 2024-06-05 MED ORDER — ATENOLOL 25 MG PO TABS: 25.0000 mg | ORAL_TABLET | Freq: Every evening | ORAL | 0 refills | Status: DC

## 2024-06-05 MED ORDER — AMLODIPINE BESYLATE-VALSARTAN 5-160 MG PO TABS: 1.0000 | ORAL_TABLET | Freq: Every day | ORAL | 0 refills | Status: DC

## 2024-06-05 NOTE — Telephone Encounter (Unsigned)
 Copied from CRM #[id number]. Topic: General - Call Back - No Documentation >> Jun 05, 2024  8:06 AM Olam RAMAN wrote: Reason for CRM: Pt called back for missed call on firday. Checked messages, wasn't detail what VM was left for PT. I did read the prior auth for approved meds (trial test) CB  [phone]

## 2024-06-05 NOTE — Telephone Encounter (Signed)
 Pt verbalized is not interested in Nexlizet .  Pt came in for BP check last week and had verbally told Dr.Sowles that ever since he started on Amlodipine  dose 5-320 he is having blood in urine, but when he does the lower dose he was on previously it stops. Please advice.

## 2024-06-05 NOTE — Telephone Encounter (Signed)
No answer   LVMTCB

## 2024-06-05 NOTE — Telephone Encounter (Signed)
 Will call him back and document under duplicate encounter

## 2024-06-06 NOTE — Telephone Encounter (Signed)
No answer from pt left detailed vm. °

## 2024-06-07 ENCOUNTER — Other Ambulatory Visit: Payer: Self-pay | Admitting: Family Medicine

## 2024-06-07 ENCOUNTER — Ambulatory Visit: Payer: Self-pay | Admitting: Family Medicine

## 2024-06-07 LAB — LIPID PANEL
Cholesterol: 212 mg/dL — ABNORMAL HIGH (ref <200)
HDL: 51 mg/dL (ref >=40)
LDL Cholesterol (Calc): 143 mg/dL — ABNORMAL HIGH
Non-HDL Cholesterol (Calc): 161 mg/dL — ABNORMAL HIGH (ref <130)
Total CHOL/HDL Ratio: 4.2 (calc) (ref <5.0)
Triglycerides: 80 mg/dL (ref <150)

## 2024-06-07 LAB — CBC WITH DIFFERENTIAL/PLATELET
Absolute Lymphocytes: 1235 {cells}/uL (ref 850–3900)
Absolute Monocytes: 520 {cells}/uL (ref 200–950)
Basophils Absolute: 52 {cells}/uL (ref 0–200)
Basophils Relative: 0.8 %
Eosinophils Absolute: 98 {cells}/uL (ref 15–500)
Eosinophils Relative: 1.5 %
HCT: 45.6 % (ref 38.5–50.0)
Hemoglobin: 15.8 g/dL (ref 13.2–17.1)
MCH: 33 pg (ref 27.0–33.0)
MCHC: 34.6 g/dL (ref 32.0–36.0)
MCV: 95.2 fL (ref 80.0–100.0)
MPV: 9.1 fL (ref 7.5–12.5)
Monocytes Relative: 8 %
Neutro Abs: 4596 {cells}/uL (ref 1500–7800)
Neutrophils Relative %: 70.7 %
Platelets: 201 Thousand/uL (ref 140–400)
RBC: 4.79 Million/uL (ref 4.20–5.80)
RDW: 13.5 % (ref 11.0–15.0)
Total Lymphocyte: 19 %
WBC: 6.5 Thousand/uL (ref 3.8–10.8)

## 2024-06-07 LAB — PSA: PSA: 0.11 ng/mL (ref <=4.00)

## 2024-06-07 LAB — COMPREHENSIVE METABOLIC PANEL WITH GFR
AG Ratio: 1.5 (calc) (ref 1.0–2.5)
ALT: 18 U/L (ref 9–46)
AST: 32 U/L (ref 10–35)
Albumin: 4.2 g/dL (ref 3.6–5.1)
Alkaline phosphatase (APISO): 65 U/L (ref 35–144)
BUN/Creatinine Ratio: 6 (calc) (ref 6–22)
BUN: 5 mg/dL — ABNORMAL LOW (ref 7–25)
CO2: 30 mmol/L (ref 20–32)
Calcium: 9.6 mg/dL (ref 8.6–10.3)
Chloride: 102 mmol/L (ref 98–110)
Creat: 0.9 mg/dL (ref 0.70–1.28)
Globulin: 2.8 g/dL (ref 1.9–3.7)
Glucose, Bld: 104 mg/dL — ABNORMAL HIGH (ref 65–99)
Potassium: 4.9 mmol/L (ref 3.5–5.3)
Sodium: 140 mmol/L (ref 135–146)
Total Bilirubin: 0.7 mg/dL (ref 0.2–1.2)
Total Protein: 7 g/dL (ref 6.1–8.1)
eGFR: 91 mL/min/1.73m2 (ref >=60)

## 2024-06-07 LAB — VITAMIN B1: Vitamin B1 (Thiamine): 14 nmol/L (ref 8–30)

## 2024-06-07 LAB — B12 AND FOLATE PANEL
Folate: 4.9 ng/mL — ABNORMAL LOW
Vitamin B-12: >2000 pg/mL — ABNORMAL HIGH (ref 200–1100)

## 2024-06-07 MED ORDER — EZETIMIBE 10 MG PO TABS: 10.0000 mg | ORAL_TABLET | Freq: Every day | ORAL | 3 refills | Status: AC

## 2024-07-04 ENCOUNTER — Other Ambulatory Visit: Payer: Self-pay | Admitting: Family Medicine

## 2024-07-04 DIAGNOSIS — I10 Essential (primary) hypertension: Secondary | ICD-10-CM

## 2024-07-05 ENCOUNTER — Other Ambulatory Visit (HOSPITAL_COMMUNITY): Payer: Self-pay

## 2024-07-06 NOTE — Telephone Encounter (Signed)
 Requested Prescriptions  Refused Prescriptions Disp Refills   amLODipine -valsartan  (EXFORGE ) 5-320 MG tablet [Pharmacy Med Name: AMLOD/VALSAR 5-320MG  TABLET] 100 tablet 2    Sig: TAKE 1 TABLET BY MOUTH DAILY     Cardiovascular: CCB + ARB Combos Failed - 07/06/2024  3:28 PM      Failed - Last BP in normal range    BP Readings from Last 1 Encounters:  05/02/24 (!) 152/84         Passed - K in normal range and within 180 days    Potassium  Date Value Ref Range Status  06/02/2024 4.9 3.5 - 5.3 mmol/L Final         Passed - Cr in normal range and within 180 days    Creat  Date Value Ref Range Status  06/02/2024 0.90 0.70 - 1.28 mg/dL Final   Creatinine, Urine  Date Value Ref Range Status  07/03/2019 37 20 - 320 mg/dL Final         Passed - Na in normal range and within 180 days    Sodium  Date Value Ref Range Status  06/02/2024 140 135 - 146 mmol/L Final  02/04/2016 139 134 - 144 mmol/L Final         Passed - Patient is not pregnant      Passed - Valid encounter within last 6 months    Recent Outpatient Visits           2 months ago Atherosclerosis of aorta   [facility] Glenard Mire, MD   3 months ago Well adult exam   [facility] Glenard Mire, MD   6 months ago Alcoholism [facility])   [facility] Glenard Mire, MD   7 months ago Gross hematuria   [facility] Bernardo Fend, DO       Future Appointments             In 2 months Glenard, Krichna, MD [facility], [place]   In 7 months Stoioff, Glendia BROCKS, MD [facility] [place]   In 8 months Sowles, Krichna, MD [facility], [place]

## 2024-07-12 ENCOUNTER — Telehealth: Payer: Self-pay

## 2024-07-12 DIAGNOSIS — E538 Deficiency of other specified B group vitamins: Secondary | ICD-10-CM

## 2024-07-12 NOTE — Addendum Note (Signed)
 Addended by: RENTERIA-GARCIA, Robertta Halfhill on: 07/12/2024 02:34 PM   Modules accepted: Orders

## 2024-07-12 NOTE — Telephone Encounter (Signed)
 Pt spouse notified of lab hours.

## 2024-07-12 NOTE — Telephone Encounter (Signed)
 Copied from CRM [id number]. Topic: Clinical - Medication Question >> Jul 12, 2024 10:01 AM Carlos Conley wrote: Reason for CRM: patient normally gets a B12 shot every month and wanted to know if he needs to continue to take it. He has no appts coming up for his b12 shot. Please advise [phone]

## 2024-09-05 ENCOUNTER — Ambulatory Visit: Admitting: Family Medicine

## 2024-09-05 ENCOUNTER — Encounter: Payer: Self-pay | Admitting: Family Medicine

## 2024-09-05 VITALS — BP 148/84 | HR 88 | Resp 16 | Ht 66.0 in | Wt 217.6 lb

## 2024-09-05 DIAGNOSIS — I10 Essential (primary) hypertension: Secondary | ICD-10-CM

## 2024-09-05 DIAGNOSIS — E519 Thiamine deficiency, unspecified: Secondary | ICD-10-CM

## 2024-09-05 DIAGNOSIS — E7800 Pure hypercholesterolemia, unspecified: Secondary | ICD-10-CM

## 2024-09-05 DIAGNOSIS — R569 Unspecified convulsions: Secondary | ICD-10-CM | POA: Diagnosis not present

## 2024-09-05 DIAGNOSIS — F1020 Alcohol dependence, uncomplicated: Secondary | ICD-10-CM | POA: Diagnosis not present

## 2024-09-05 DIAGNOSIS — E538 Deficiency of other specified B group vitamins: Secondary | ICD-10-CM

## 2024-09-05 DIAGNOSIS — I700 Atherosclerosis of aorta: Secondary | ICD-10-CM | POA: Diagnosis not present

## 2024-09-05 MED ORDER — AMLODIPINE BESYLATE-VALSARTAN 10-320 MG PO TABS: 1.0000 | ORAL_TABLET | Freq: Every day | ORAL | 0 refills | Status: AC

## 2024-09-05 MED ORDER — FOLIC ACID 1 MG PO TABS: 1.0000 mg | ORAL_TABLET | Freq: Every day | ORAL | 1 refills | Status: AC

## 2024-09-05 NOTE — Progress Notes (Signed)
 Name: Carlos Conley   MRN: [id number]    DOB: 18-Feb-1951   Date:09/05/2024       Progress Note  Subjective  Chief Complaint  Chief Complaint  Patient presents with   Medical Management of Chronic Issues   Discussed the use of AI scribe software for clinical note transcription with the patient, who gave verbal consent to proceed.  History of Present Illness Carlos Conley is a 74 year old male with hypertension and atherosclerosis who presents for blood pressure management.  His blood pressure at home has been ranging from 140/56 to 145/80. He has been on a higher dose of amlodipine /valsartan  (5/320 mg) since December, which he received as a 90-day supply, however he has not been taking Atenolol  25 mg qpm as prescribed He denies chest pain or palpitations, dizziness or SOB  He is taking Zetia  for cholesterol management due to statin intolerance, having previously tried simvastatin, atorvastatin, and pravastatin , all of which caused myalgias. He could not afford Nexlizet  and is not interested in injectable options. LDL not at goal but improved   He has a history of B12 and B1 deficiencies. He was receiving B12 injections, but his B12 levels were very high in October, while his folic acid  was low at 4.9. He takes B1 over the counter.  He consumes about three to four beers daily and has stopped consuming liquor. His alcohol consumption has decreased since he stopped going to bars. However still drinking daily and not ready to quit   He has a history of childhood seizures, which were not related to alcohol use, and he discontinued seizure medications years ago without any recurrence. Last seizure about 5 - 6 years ago   He follows a pescetarian diet, consuming only fish and no red meat. He is more active now, engaging in activities like cutting wood during the winter.  He has a history of hepatosplenomegaly, and his liver enzymes were normal in the last test. No current symptoms of  reflux, heartburn, or indigestion.    Patient Active Problem List   Diagnosis Date Noted   Hepatomegaly 12/30/2023   Vitamin B12 deficiency 12/30/2023   Vitamin B1 deficiency 12/30/2023   Pescetarian 03/11/2023   Alcoholism (HCC) 03/09/2022   Pure hypercholesterolemia 03/09/2022   Statin intolerance 03/09/2022   Seizures (HCC) 03/09/2022   History of prostate cancer 03/09/2022   History of syphilis 03/09/2022   Hematuria 08/10/2019   Atherosclerosis of aorta 08/10/2019   Fatty liver 08/10/2019   History of total right hip replacement 08/11/2017   Reflux esophagitis    Benign neoplasm of transverse colon    Hyperlipidemia 01/02/2016   Essential hypertension 01/02/2016   ED (erectile dysfunction) of organic origin 02/16/2013    Past Surgical History:  Procedure Laterality Date   COLONOSCOPY WITH PROPOFOL  N/A 07/28/2016   Procedure: COLONOSCOPY WITH PROPOFOL ;  Surgeon: Rogelia Copping, MD;  Location: [facility] END[facility]COPY;  Service: Endoscopy;  Laterality: N/A;   radical retropubic prostatectomy  08/2000   TOTAL HIP ARTHROPLASTY Right 05/27/2017   Procedure: TOTAL HIP ARTHROPLASTY ANTERIOR APPROACH;  Surgeon: Kathlynn Sharper, MD;  Location: [facility] ORS;  Service: Orthopedics;  Laterality: Right;    Family History  Problem Relation Age of Onset   Cancer Father    Leukemia Father    Hypertension Sister    Hypertension Brother    Hypertension Sister    Hypertension Sister     Social History   Tobacco Use   Smoking status: Never   Smokeless tobacco:  Never  Substance Use Topics   Alcohol use: Yes    Alcohol/week: 2.0 - 3.0 standard drinks of alcohol    Types: 2 - 3 Cans of beer per week    Comment: daily    Current Medications[1]  Allergies[2]  I personally reviewed active problem list, medication list, allergies, family history with the patient/caregiver today.   ROS  Ten systems reviewed and is negative except as mentioned in HPI    Objective Physical  Exam CONSTITUTIONAL: Patient appears well-developed and well-nourished. No distress. HEENT: Head atraumatic, normocephalic, neck supple. CARDIOVASCULAR: Normal rate, regular rhythm and normal heart sounds. No murmur heard. No BLE edema. PULMONARY: Effort normal and breath sounds normal. Lungs clear to auscultation bilaterally. No respiratory distress. ABDOMINAL: There is no tenderness or distention. MUSCULOSKELETAL: Normal gait. Without gross motor or sensory deficit. PSYCHIATRIC: Patient has a normal mood and affect. Behavior is normal. Judgment and thought content normal.  Vitals:   09/05/24 0831 09/05/24 0858  BP: (!) 158/86 (!) 148/84  Pulse: 88   Resp: 16   SpO2: 96%   Weight: 217 lb 9.6 oz (98.7 kg)   Height: 5' 6 (1.676 m)     Body mass index is 35.12 kg/m.    PHQ2/9:    09/05/2024    8:28 AM 05/02/2024    8:31 AM 04/13/2024   11:45 AM 03/14/2024    8:00 AM 09/16/2023    8:22 AM  Depression screen PHQ 2/9  Decreased Interest 0 0 0 0 0  Down, Depressed, Hopeless 0 0 0 0 0  PHQ - 2 Score 0 0 0 0 0  Altered sleeping  0 0  0  Tired, decreased energy  0 0  0  Change in appetite  0 0  0  Feeling bad or failure about yourself   0 0  0  Trouble concentrating  0 0  0  Moving slowly or fidgety/restless  0 0  0  Suicidal thoughts  0 0  0  PHQ-9 Score  0  0   0   Difficult doing work/chores  Not difficult at all Not difficult at all  Not difficult at all     Data saved with a previous flowsheet row definition    phq 9 is negative  Fall Risk:    09/05/2024    8:28 AM 05/02/2024    8:31 AM 04/13/2024   11:47 AM 03/14/2024    8:00 AM 09/16/2023    8:21 AM  Fall Risk   Falls in the past year? 0 0 0 0 0  Number falls in past yr: 0 0 0 0 0  Injury with Fall? 0 0  0  0  0   Risk for fall due to : No Fall Risks No Fall Risks No Fall Risks No Fall Risks No Fall Risks  Follow up Falls evaluation completed Falls evaluation completed Falls evaluation completed;Falls prevention  discussed Falls evaluation completed Falls prevention discussed;Education provided;Falls evaluation completed     Data saved with a previous flowsheet row definition      Assessment & Plan Essential hypertension Blood pressure remains elevated at home despite current regimen. Atenolol  was not started. - Increased amlodipine /valsartan  to 10/320 mg. - Discontinued atenolol . - Continue home blood pressure monitoring.  Pure hypercholesterolemia with statin intolerance Cholesterol levels improved but suboptimal. Zetia  continued due to statin intolerance. - Continue Zetia .  Alcoholism  Consumes 3-4 beers daily. - Encouraged further reduction in alcohol consumption.  Atherosclerosis of aorta  Folic acid  deficiency Previous low folic acid  levels. No neuropathy symptoms reported. - Sent folic acid  prescription to [facility] for mail delivery.  Vitamin B12 deficiency Previous high B12 levels. Supplementation stopped. Plan to recheck levels. - Ordered B12 level test. - Resume B12 injections if levels are low.  Vitamin B1 deficiency Previous low B1 levels improved with supplementation. - Continue B1 supplementation, consider weekly dosing.  Hepatosplenomegaly due to alcohol use Liver enzymes normal, but likely liver damage from alcohol use. - Encouraged reduction in alcohol consumption.        [1]  Current Outpatient Medications:    amLODipine -valsartan  (EXFORGE ) 10-320 MG tablet, Take 1 tablet by mouth daily. New dose, Disp: 90 tablet, Rfl: 0   ezetimibe  (ZETIA ) 10 MG tablet, Take 1 tablet (10 mg total) by mouth daily., Disp: 90 tablet, Rfl: 3   folic acid  (FOLVITE ) 1 MG tablet, Take 1 tablet (1 mg total) by mouth daily., Disp: 100 tablet, Rfl: 1   thiamine  (VITAMIN B-1) 50 MG tablet, Take 1 tablet (50 mg total) by mouth daily., Disp: 100 tablet, Rfl: 1 [2]  Allergies Allergen Reactions   Lipitor [Atorvastatin] Rash   Pravastatin  Itching, Rash and Other (See Comments)     Redness from feet up to knees.   Simvastatin Itching and Rash

## 2024-09-06 ENCOUNTER — Ambulatory Visit: Payer: Self-pay | Admitting: Family Medicine

## 2024-09-11 LAB — B12 AND FOLATE PANEL
Folate: 5.2 ng/mL — ABNORMAL LOW
Vitamin B-12: 413 pg/mL (ref 200–1100)

## 2024-09-11 LAB — VITAMIN B1: Vitamin B1 (Thiamine): 7 nmol/L — ABNORMAL LOW (ref 8–30)

## 2024-09-12 ENCOUNTER — Other Ambulatory Visit: Payer: Self-pay | Admitting: Family Medicine

## 2025-01-03 ENCOUNTER — Ambulatory Visit: Admitting: Family Medicine

## 2025-02-07 ENCOUNTER — Ambulatory Visit: Admitting: Urology

## 2025-03-30 ENCOUNTER — Encounter: Admitting: Family Medicine

## 2025-04-19 ENCOUNTER — Ambulatory Visit
